# Patient Record
Sex: Female | Born: 1935 | Race: White | Hispanic: No | Smoking: Never smoker
Health system: Southern US, Community
[De-identification: ages and names within clinical notes are randomized; demographics above are authoritative.]

## PROBLEM LIST (undated history)

## (undated) DIAGNOSIS — M1990 Unspecified osteoarthritis, unspecified site: Secondary | ICD-10-CM

## (undated) DIAGNOSIS — I2510 Atherosclerotic heart disease of native coronary artery without angina pectoris: Secondary | ICD-10-CM

## (undated) DIAGNOSIS — I10 Essential (primary) hypertension: Secondary | ICD-10-CM

## (undated) DIAGNOSIS — F0390 Unspecified dementia without behavioral disturbance: Secondary | ICD-10-CM

## (undated) DIAGNOSIS — I5030 Unspecified diastolic (congestive) heart failure: Secondary | ICD-10-CM

## (undated) DIAGNOSIS — S32599A Other specified fracture of unspecified pubis, initial encounter for closed fracture: Secondary | ICD-10-CM

## (undated) DIAGNOSIS — J449 Chronic obstructive pulmonary disease, unspecified: Secondary | ICD-10-CM

## (undated) DIAGNOSIS — C439 Malignant melanoma of skin, unspecified: Secondary | ICD-10-CM

## (undated) DIAGNOSIS — K8020 Calculus of gallbladder without cholecystitis without obstruction: Secondary | ICD-10-CM

## (undated) DIAGNOSIS — M810 Age-related osteoporosis without current pathological fracture: Secondary | ICD-10-CM

## (undated) DIAGNOSIS — E785 Hyperlipidemia, unspecified: Secondary | ICD-10-CM

## (undated) HISTORY — DX: Essential (primary) hypertension: I10

## (undated) HISTORY — PX: TONSILLECTOMY AND ADENOIDECTOMY: SUR1326

## (undated) HISTORY — DX: Calculus of gallbladder without cholecystitis without obstruction: K80.20

## (undated) HISTORY — PX: APPENDECTOMY: SHX54

## (undated) HISTORY — PX: CATARACT EXTRACTION: SUR2

## (undated) HISTORY — DX: Hyperlipidemia, unspecified: E78.5

## (undated) HISTORY — PX: CHOLECYSTECTOMY: SHX55

## (undated) HISTORY — DX: Unspecified osteoarthritis, unspecified site: M19.90

## (undated) HISTORY — DX: Chronic obstructive pulmonary disease, unspecified: J44.9

## (undated) HISTORY — DX: Unspecified diastolic (congestive) heart failure: I50.30

## (undated) HISTORY — DX: Malignant melanoma of skin, unspecified: C43.9

## (undated) HISTORY — DX: Atherosclerotic heart disease of native coronary artery without angina pectoris: I25.10

## (undated) HISTORY — PX: MELANOMA EXCISION: SHX5266

## (undated) HISTORY — DX: Age-related osteoporosis without current pathological fracture: M81.0

## (undated) HISTORY — PX: BREAST LUMPECTOMY: SHX2

## (undated) HISTORY — PX: CARPAL TUNNEL RELEASE: SHX101

## (undated) HISTORY — PX: PAROTIDECTOMY: SUR1003

## (undated) HISTORY — PX: BLADDER SUSPENSION: SHX72

## (undated) HISTORY — PX: LYMPH NODE BIOPSY: SHX201

---

## 1999-07-21 ENCOUNTER — Other Ambulatory Visit: Admission: RE | Admit: 1999-07-21 | Discharge: 1999-07-21 | Payer: Self-pay | Admitting: Family Medicine

## 2000-07-20 ENCOUNTER — Other Ambulatory Visit: Admission: RE | Admit: 2000-07-20 | Discharge: 2000-07-20 | Payer: Self-pay | Admitting: Family Medicine

## 2000-10-28 ENCOUNTER — Ambulatory Visit (HOSPITAL_COMMUNITY): Admission: RE | Admit: 2000-10-28 | Discharge: 2000-10-28 | Payer: Self-pay | Admitting: Gastroenterology

## 2000-10-28 ENCOUNTER — Encounter: Payer: Self-pay | Admitting: Gastroenterology

## 2000-10-28 ENCOUNTER — Encounter (INDEPENDENT_AMBULATORY_CARE_PROVIDER_SITE_OTHER): Payer: Self-pay | Admitting: *Deleted

## 2001-07-25 ENCOUNTER — Other Ambulatory Visit: Admission: RE | Admit: 2001-07-25 | Discharge: 2001-07-25 | Payer: Self-pay | Admitting: Family Medicine

## 2002-08-29 ENCOUNTER — Other Ambulatory Visit: Admission: RE | Admit: 2002-08-29 | Discharge: 2002-08-29 | Payer: Self-pay | Admitting: Family Medicine

## 2003-09-17 ENCOUNTER — Other Ambulatory Visit: Admission: RE | Admit: 2003-09-17 | Discharge: 2003-09-17 | Payer: Self-pay | Admitting: Family Medicine

## 2004-08-06 ENCOUNTER — Ambulatory Visit (HOSPITAL_COMMUNITY): Admission: RE | Admit: 2004-08-06 | Discharge: 2004-08-06 | Payer: Self-pay | Admitting: Cardiology

## 2004-09-05 ENCOUNTER — Ambulatory Visit (HOSPITAL_COMMUNITY): Admission: RE | Admit: 2004-09-05 | Discharge: 2004-09-05 | Payer: Self-pay | Admitting: Urology

## 2004-11-17 ENCOUNTER — Other Ambulatory Visit: Admission: RE | Admit: 2004-11-17 | Discharge: 2004-11-17 | Payer: Self-pay | Admitting: Family Medicine

## 2005-09-22 ENCOUNTER — Other Ambulatory Visit: Admission: RE | Admit: 2005-09-22 | Discharge: 2005-09-22 | Payer: Self-pay | Admitting: Family Medicine

## 2005-12-21 ENCOUNTER — Encounter: Admission: RE | Admit: 2005-12-21 | Discharge: 2005-12-21 | Payer: Self-pay | Admitting: Orthopedic Surgery

## 2005-12-25 ENCOUNTER — Ambulatory Visit (HOSPITAL_BASED_OUTPATIENT_CLINIC_OR_DEPARTMENT_OTHER): Admission: RE | Admit: 2005-12-25 | Discharge: 2005-12-25 | Payer: Self-pay | Admitting: Orthopedic Surgery

## 2006-09-03 ENCOUNTER — Ambulatory Visit (HOSPITAL_BASED_OUTPATIENT_CLINIC_OR_DEPARTMENT_OTHER): Admission: RE | Admit: 2006-09-03 | Discharge: 2006-09-03 | Payer: Self-pay | Admitting: Orthopedic Surgery

## 2006-09-24 ENCOUNTER — Other Ambulatory Visit: Admission: RE | Admit: 2006-09-24 | Discharge: 2006-09-24 | Payer: Self-pay | Admitting: Family Medicine

## 2008-01-10 ENCOUNTER — Encounter: Admission: RE | Admit: 2008-01-10 | Discharge: 2008-04-05 | Payer: Self-pay | Admitting: Family Medicine

## 2008-07-09 ENCOUNTER — Encounter: Admission: RE | Admit: 2008-07-09 | Discharge: 2008-10-07 | Payer: Self-pay | Admitting: Family Medicine

## 2009-05-28 ENCOUNTER — Encounter (HOSPITAL_COMMUNITY): Admission: RE | Admit: 2009-05-28 | Discharge: 2009-08-05 | Payer: Self-pay | Admitting: Family Medicine

## 2009-06-28 ENCOUNTER — Encounter: Admission: RE | Admit: 2009-06-28 | Discharge: 2009-06-28 | Payer: Self-pay | Admitting: Family Medicine

## 2009-07-04 ENCOUNTER — Encounter (INDEPENDENT_AMBULATORY_CARE_PROVIDER_SITE_OTHER): Payer: Self-pay | Admitting: Surgery

## 2009-07-04 ENCOUNTER — Inpatient Hospital Stay (HOSPITAL_COMMUNITY): Admission: RE | Admit: 2009-07-04 | Discharge: 2009-07-10 | Payer: Self-pay | Admitting: Gastroenterology

## 2009-07-31 ENCOUNTER — Ambulatory Visit (HOSPITAL_COMMUNITY)
Admission: RE | Admit: 2009-07-31 | Discharge: 2009-07-31 | Payer: Self-pay | Source: Home / Self Care | Admitting: Surgery

## 2009-08-21 ENCOUNTER — Encounter: Admission: RE | Admit: 2009-08-21 | Discharge: 2009-09-25 | Payer: Self-pay | Admitting: Family Medicine

## 2010-06-24 ENCOUNTER — Encounter
Admission: RE | Admit: 2010-06-24 | Discharge: 2010-07-01 | Payer: Self-pay | Source: Home / Self Care | Attending: Family Medicine | Admitting: Family Medicine

## 2010-07-02 ENCOUNTER — Ambulatory Visit: Payer: BC Managed Care – HMO

## 2010-07-03 ENCOUNTER — Ambulatory Visit: Payer: Medicare Other | Attending: Family Medicine | Admitting: Physical Therapy

## 2010-07-03 DIAGNOSIS — M25619 Stiffness of unspecified shoulder, not elsewhere classified: Secondary | ICD-10-CM | POA: Insufficient documentation

## 2010-07-03 DIAGNOSIS — IMO0001 Reserved for inherently not codable concepts without codable children: Secondary | ICD-10-CM | POA: Insufficient documentation

## 2010-07-03 DIAGNOSIS — M25519 Pain in unspecified shoulder: Secondary | ICD-10-CM | POA: Insufficient documentation

## 2010-07-03 DIAGNOSIS — M542 Cervicalgia: Secondary | ICD-10-CM | POA: Insufficient documentation

## 2010-07-07 ENCOUNTER — Ambulatory Visit: Payer: Medicare Other

## 2010-07-10 ENCOUNTER — Ambulatory Visit: Payer: Medicare Other | Admitting: Physical Therapy

## 2010-07-14 ENCOUNTER — Ambulatory Visit: Payer: Medicare Other

## 2010-07-17 ENCOUNTER — Ambulatory Visit: Payer: Medicare Other | Admitting: Physical Therapy

## 2010-07-21 ENCOUNTER — Ambulatory Visit: Payer: Medicare Other

## 2010-07-24 ENCOUNTER — Ambulatory Visit: Payer: Medicare Other | Admitting: Physical Therapy

## 2010-08-20 LAB — CBC
HCT: 37.4 % (ref 36.0–46.0)
HCT: 37.8 % (ref 36.0–46.0)
HCT: 39.6 % (ref 36.0–46.0)
Hemoglobin: 12.9 g/dL (ref 12.0–15.0)
Hemoglobin: 13.1 g/dL (ref 12.0–15.0)
Hemoglobin: 13.6 g/dL (ref 12.0–15.0)
MCHC: 34.1 g/dL (ref 30.0–36.0)
MCHC: 34.4 g/dL (ref 30.0–36.0)
MCV: 95.1 fL (ref 78.0–100.0)
MCV: 96 fL (ref 78.0–100.0)
MCV: 96.2 fL (ref 78.0–100.0)
Platelets: 228 10*3/uL (ref 150–400)
Platelets: 240 10*3/uL (ref 150–400)
Platelets: 293 10*3/uL (ref 150–400)
Platelets: 317 10*3/uL (ref 150–400)
RBC: 4.08 MIL/uL (ref 3.87–5.11)
RBC: 4.13 MIL/uL (ref 3.87–5.11)
RDW: 14.7 % (ref 11.5–15.5)
RDW: 15 % (ref 11.5–15.5)
WBC: 10.9 10*3/uL — ABNORMAL HIGH (ref 4.0–10.5)
WBC: 12 10*3/uL — ABNORMAL HIGH (ref 4.0–10.5)

## 2010-08-20 LAB — COMPREHENSIVE METABOLIC PANEL
ALT: 31 U/L (ref 0–35)
ALT: 33 U/L (ref 0–35)
ALT: 53 U/L — ABNORMAL HIGH (ref 0–35)
ALT: 57 U/L — ABNORMAL HIGH (ref 0–35)
ALT: 79 U/L — ABNORMAL HIGH (ref 0–35)
AST: 68 U/L — ABNORMAL HIGH (ref 0–37)
Albumin: 2.3 g/dL — ABNORMAL LOW (ref 3.5–5.2)
Albumin: 2.4 g/dL — ABNORMAL LOW (ref 3.5–5.2)
Albumin: 2.4 g/dL — ABNORMAL LOW (ref 3.5–5.2)
Albumin: 2.5 g/dL — ABNORMAL LOW (ref 3.5–5.2)
Albumin: 2.5 g/dL — ABNORMAL LOW (ref 3.5–5.2)
Albumin: 2.8 g/dL — ABNORMAL LOW (ref 3.5–5.2)
Alkaline Phosphatase: 479 U/L — ABNORMAL HIGH (ref 39–117)
BUN: 10 mg/dL (ref 6–23)
BUN: 6 mg/dL (ref 6–23)
BUN: 9 mg/dL (ref 6–23)
CO2: 28 mEq/L (ref 19–32)
CO2: 31 mEq/L (ref 19–32)
Calcium: 8 mg/dL — ABNORMAL LOW (ref 8.4–10.5)
Calcium: 8.1 mg/dL — ABNORMAL LOW (ref 8.4–10.5)
Calcium: 8.4 mg/dL (ref 8.4–10.5)
Calcium: 8.5 mg/dL (ref 8.4–10.5)
Calcium: 8.6 mg/dL (ref 8.4–10.5)
Chloride: 100 mEq/L (ref 96–112)
Chloride: 103 mEq/L (ref 96–112)
Creatinine, Ser: 0.72 mg/dL (ref 0.4–1.2)
Creatinine, Ser: 0.86 mg/dL (ref 0.4–1.2)
GFR calc Af Amer: >60 mL/min (ref >60)
GFR calc Af Amer: >60 mL/min (ref >60)
GFR calc non Af Amer: >60 mL/min (ref >60)
GFR calc non Af Amer: >60 mL/min (ref >60)
GFR calc non Af Amer: >60 mL/min (ref >60)
Glucose, Bld: 113 mg/dL — ABNORMAL HIGH (ref 70–99)
Glucose, Bld: 137 mg/dL — ABNORMAL HIGH (ref 70–99)
Potassium: 3.3 mEq/L — ABNORMAL LOW (ref 3.5–5.1)
Potassium: 3.4 mEq/L — ABNORMAL LOW (ref 3.5–5.1)
Potassium: 3.7 mEq/L (ref 3.5–5.1)
Potassium: 4 mEq/L (ref 3.5–5.1)
Sodium: 137 mEq/L (ref 135–145)
Sodium: 141 mEq/L (ref 135–145)
Total Bilirubin: 2.1 mg/dL — ABNORMAL HIGH (ref 0.3–1.2)
Total Bilirubin: 4.2 mg/dL — ABNORMAL HIGH (ref 0.3–1.2)
Total Bilirubin: 4.3 mg/dL — ABNORMAL HIGH (ref 0.3–1.2)
Total Protein: 5.7 g/dL — ABNORMAL LOW (ref 6.0–8.3)
Total Protein: 6.1 g/dL (ref 6.0–8.3)

## 2010-08-20 LAB — ABO/RH: ABO/RH(D): O POS

## 2010-08-20 LAB — TYPE AND SCREEN: ABO/RH(D): O POS

## 2010-08-20 LAB — GLUCOSE, CAPILLARY

## 2010-10-17 NOTE — Op Note (Signed)
NAME:  Alice Hayes, Alice Hayes            ACCOUNT NO.:  [id number]   MEDICAL RECORD NO.:  [id number]          PATIENT TYPE:  AMB   LOCATION:  DSC                          FACILITY:  [facility]   PHYSICIAN:  Katy Fitch. Sypher, M.D. 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   POSTOP DIAGNOSIS:  Chronic entrapped neuropathy right median nerve at  carpal tunnel.   OPERATION:  Release of right transverse carpal ligament.   OPERATIONS:  Katy Fitch. Sypher, M.D.   ASSISTANT:  Marveen Reeks. Dasnoit, P.A.-C.   ANESTHESIA:  General by LMA.   SUPERVISING ANESTHESIOLOGIST:  Janetta Hora. Gelene Mink, M.D.   INDICATIONS:  Alice Hayes is an 77 year old woman referred through  the courtesy of Dr. Chales Salmon. Thacker for evaluation and management of  chronic right hand numbness.  Clinical examination revealed signs of  diffuse osteoarthritis at her thumb CMC joint and multiple  interphalangeal joints.  Electrodiagnostic studies completed at the  office confirmed significant right carpal tunnel syndrome.  Due to a  failure to respond to nonoperative measures, she is brought to the  operative room, at this time, for release of her right transverse carpal  ligament   PROCEDURE:  Alice Hayes is brought to the operating room and placed  in the supine position on the operating table.  Following the induction  of general anesthesia by LMA technique, the right arm was prepped with  Betadine soap and solution, and sterilely draped.  A pneumatic  tourniquet was applied to the proximal brachium.  Upon exsanguination of  the right arm with an Esmarch bandage; the arterial tourniquet was  inflated to 240 mmHg.   The procedure commenced with a short incision in the line of the ring  finger and the palm.  The subcutaneous tissues were  carefully divided in  the palmar fascia.  This was split longitudinally to reveal the common  sensory branch of the median nerve.  These were followed back to  transverse carpal ligament which was gently isolated from the median  nerve.  The ligament was then released with scissors along its ulnar  border.  This widely opened the carpal canal.  No masses or predicaments  were noted.  The nerve was quite hyperemic with significant bursal  fibrosis noted.  The wound was then inspected for bleeding points which  were repaired with intradermal 3-0 Prolene suture.   A compression dressing was applied with a volar plaster splint,  maintaining the wrist in 5 degrees of dorsiflexion.  For aftercare Ms.  Hayes is advised to use Vicodin 5 mg one p.o. q.4-6 h. p.r.n. pain  20 tablets without refill.      Katy Fitch Sypher, M.D.  Electronically Signed     RVS/MEDQ  D:  09/03/2006  T:  09/03/2006  Job:  [id number]

## 2010-10-17 NOTE — Cardiovascular Report (Signed)
NAMESHATERRA, SANZONE            ACCOUNT NO.:  [id number]   MEDICAL RECORD NO.:  [id number]          PATIENT TYPE:  OIB   LOCATION:  2899                         FACILITY:  [facility]   PHYSICIAN:  Armanda Magic, M.D.     DATE OF BIRTH:  1922-11-02   DATE OF PROCEDURE:  08/06/2004  DATE OF DISCHARGE:                              CARDIAC CATHETERIZATION   REFERRING PHYSICIAN:  Henrine Screws, M.D.   INDICATION FOR CATH:  Chest pain.   PROCEDURE:  1.  Left heart catheterization.  2.  Coronary angiography.  3.  Left ventriculography.   OPERATOR:  Armanda Magic, M.D.   INDICATIONS:  Chest pain and abnormal Cardiolite.   COMPLICATIONS:  None.   IV ACCESS:  Via right femoral artery 6 French sheath.   This is a 77 year old white female with a history of labile blood pressure  recently documented who had been having episodes of substernal chest pain  and recently several episodes which have been relieved by sublingual  nitroglycerin.  Stress Cardiolite study showed a questionable reversible  defect in the inferior septum at the base worrisome for underlying inducible  ischemia and, therefore, she now presents for cardiac catheterization.   The patient was brought to the cardiac catheterization laboratory in a  fasting nonsedated state.  Informed consent was obtained.  The patient was  connected to continuous heart rate, pulse oximetry monitoring and  intermittent blood pressure monitoring.  The right groin was prepped and  draped in a sterile fashion.  Xylocaine 1% was used for local anesthesia.  Using a modified Seldinger technique, an 8 French sheath was placed in the  right femoral artery.  Under fluoroscopic guidance, a 6 [place] JL4 catheter  was placed in the left coronary artery.  Multiple cine films were taken in  the 30 degree RAO, 40 degree LAO views.  This catheter was then exchanged  out of a guide wire for a 6 [place] JR4 catheter which was placed under  fluoroscopic  guidance in the right coronary artery.  Multiple cine films  were taken in the 30 degree RAO, 40 degree LAO views.  The catheter was then  exchanged out of her guide wire for a 6 French angled pigtail catheter which  was placed under fluoroscopic guidance in the left ventricular cavity.  Left  ventriculography was performed in the 30 degree RAO view using a total of 30  mL of contrast at 15 mL per second.  The catheter was then pulled back  across the aortic valve with no significant gradient noted.  At the end of  the procedure all catheters and sheaths were removed.  Manual compression  was performed until adequate hemostasis was obtained, and the patient was  transferred back to the room in stable condition.   RESULTS:  1.  Left main coronary artery is widely patent and bifurcates into the left      anterior descending artery and left circumflex artery.  The left      anterior descending artery is widely patent throughout its course.  In      the apex, except for luminal irregularities,  there is a focal 40-50%      narrowing in the mid LAD just after the takeoff the first diagonal.  The      first diagonal was widely patent.  The left circumflex is widely patent      throughout its course and there is noted to be luminal irregularities up      to 30% throughout the left circumflex.  It does give rise to two obtuse      marginal branches, both of which are widely patent.  The right coronary      artery has luminal irregularities, up to 30%, throughout its course, and      bifurcates distally in the posterior descending artery and posterior      lateral artery, both of which are widely patent.  Of note though, the      posterior descending artery and posterior lateral artery are small      vessels.   Left ventriculography shows normal LV systolic function, left ventricular  pressure 141/7 mmHg, aortic pressure 139/58 mmHg.   ASSESSMENT:  1.  Nonobstructive coronary disease.  2.   Normal left ventricular function.  3.  Chest pain, questionably secondary to coronary vasospasm verses      esophageal spasm.  There was no evidence of coronary vasospasm during      the cath and, therefore, I favor most likely esophageal spasm which also      is nitrate sensitive.  4.  Hypertension, which is stable.   PLAN:  1.  Discharge home after IV fluid and bedrest.  2.  Aggressive blood pressure control.  3.  Aspirin daily.  4.  Followup with me in 2 weeks for groin check.  5.  Followup with Dr. Abigail Miyamoto for further workup of probable GI cause of      chest pain.      TT/MEDQ  D:  08/06/2004  T:  08/06/2004  Job:  [id number]   cc:   Chales Salmon. Abigail Miyamoto, M.D.  [address]  [place]  [place] [address]  Fax: [phone]

## 2011-01-09 ENCOUNTER — Other Ambulatory Visit: Payer: Self-pay | Admitting: Gastroenterology

## 2011-03-31 ENCOUNTER — Ambulatory Visit: Payer: Medicare Other | Attending: Family Medicine | Admitting: Physical Therapy

## 2011-03-31 DIAGNOSIS — IMO0001 Reserved for inherently not codable concepts without codable children: Secondary | ICD-10-CM | POA: Insufficient documentation

## 2011-03-31 DIAGNOSIS — M2569 Stiffness of other specified joint, not elsewhere classified: Secondary | ICD-10-CM | POA: Insufficient documentation

## 2011-03-31 DIAGNOSIS — M542 Cervicalgia: Secondary | ICD-10-CM | POA: Insufficient documentation

## 2011-04-02 ENCOUNTER — Ambulatory Visit: Payer: Medicare Other | Attending: Family Medicine | Admitting: Physical Therapy

## 2011-04-02 DIAGNOSIS — M2569 Stiffness of other specified joint, not elsewhere classified: Secondary | ICD-10-CM | POA: Insufficient documentation

## 2011-04-02 DIAGNOSIS — IMO0001 Reserved for inherently not codable concepts without codable children: Secondary | ICD-10-CM | POA: Insufficient documentation

## 2011-04-02 DIAGNOSIS — M542 Cervicalgia: Secondary | ICD-10-CM | POA: Insufficient documentation

## 2011-04-14 ENCOUNTER — Encounter: Payer: Medicare Other | Admitting: Physical Therapy

## 2011-04-16 ENCOUNTER — Ambulatory Visit: Payer: Medicare Other | Admitting: Physical Therapy

## 2011-04-27 ENCOUNTER — Ambulatory Visit: Payer: Medicare Other

## 2011-04-30 ENCOUNTER — Ambulatory Visit: Payer: Medicare Other | Admitting: Physical Therapy

## 2011-05-05 ENCOUNTER — Encounter: Payer: Medicare Other | Admitting: Physical Therapy

## 2011-05-07 ENCOUNTER — Encounter: Payer: Medicare Other | Admitting: Physical Therapy

## 2011-05-12 ENCOUNTER — Encounter: Payer: Medicare Other | Admitting: Physical Therapy

## 2011-05-14 ENCOUNTER — Encounter: Payer: Medicare Other | Admitting: Physical Therapy

## 2011-06-23 DIAGNOSIS — M25569 Pain in unspecified knee: Secondary | ICD-10-CM | POA: Diagnosis not present

## 2011-06-23 DIAGNOSIS — J019 Acute sinusitis, unspecified: Secondary | ICD-10-CM | POA: Diagnosis not present

## 2011-06-23 DIAGNOSIS — M542 Cervicalgia: Secondary | ICD-10-CM | POA: Diagnosis not present

## 2011-07-06 DIAGNOSIS — M7150 Other bursitis, not elsewhere classified, unspecified site: Secondary | ICD-10-CM | POA: Diagnosis not present

## 2011-07-06 DIAGNOSIS — L97509 Non-pressure chronic ulcer of other part of unspecified foot with unspecified severity: Secondary | ICD-10-CM | POA: Diagnosis not present

## 2011-07-07 DIAGNOSIS — Z85828 Personal history of other malignant neoplasm of skin: Secondary | ICD-10-CM | POA: Diagnosis not present

## 2011-07-07 DIAGNOSIS — L570 Actinic keratosis: Secondary | ICD-10-CM | POA: Diagnosis not present

## 2011-07-28 DIAGNOSIS — H35059 Retinal neovascularization, unspecified, unspecified eye: Secondary | ICD-10-CM | POA: Diagnosis not present

## 2011-07-28 DIAGNOSIS — H43819 Vitreous degeneration, unspecified eye: Secondary | ICD-10-CM | POA: Diagnosis not present

## 2011-07-28 DIAGNOSIS — H353290 Exudative age-related macular degeneration, unspecified eye, stage unspecified: Secondary | ICD-10-CM | POA: Diagnosis not present

## 2011-07-28 DIAGNOSIS — H353190 Nonexudative age-related macular degeneration, unspecified eye, stage unspecified: Secondary | ICD-10-CM | POA: Diagnosis not present

## 2011-09-28 DIAGNOSIS — H903 Sensorineural hearing loss, bilateral: Secondary | ICD-10-CM | POA: Diagnosis not present

## 2011-10-08 ENCOUNTER — Other Ambulatory Visit: Payer: Self-pay | Admitting: Family Medicine

## 2011-10-08 DIAGNOSIS — M21619 Bunion of unspecified foot: Secondary | ICD-10-CM | POA: Diagnosis not present

## 2011-10-08 DIAGNOSIS — M2030 Hallux varus (acquired), unspecified foot: Secondary | ICD-10-CM | POA: Diagnosis not present

## 2011-10-08 DIAGNOSIS — R109 Unspecified abdominal pain: Secondary | ICD-10-CM | POA: Diagnosis not present

## 2011-10-08 DIAGNOSIS — R51 Headache: Secondary | ICD-10-CM | POA: Diagnosis not present

## 2011-10-12 DIAGNOSIS — Z79899 Other long term (current) drug therapy: Secondary | ICD-10-CM | POA: Diagnosis not present

## 2011-10-12 DIAGNOSIS — E039 Hypothyroidism, unspecified: Secondary | ICD-10-CM | POA: Diagnosis not present

## 2011-10-12 DIAGNOSIS — E782 Mixed hyperlipidemia: Secondary | ICD-10-CM | POA: Diagnosis not present

## 2011-10-13 ENCOUNTER — Other Ambulatory Visit: Payer: Self-pay | Admitting: Gastroenterology

## 2011-10-13 DIAGNOSIS — K5730 Diverticulosis of large intestine without perforation or abscess without bleeding: Secondary | ICD-10-CM

## 2011-10-15 ENCOUNTER — Ambulatory Visit
Admission: RE | Admit: 2011-10-15 | Discharge: 2011-10-15 | Disposition: A | Discharge status: Home / Self Care | Payer: Medicare Other | Source: Ambulatory Visit | Attending: Family Medicine | Admitting: Family Medicine

## 2011-10-15 DIAGNOSIS — Z9089 Acquired absence of other organs: Secondary | ICD-10-CM | POA: Diagnosis not present

## 2011-10-15 DIAGNOSIS — Z1231 Encounter for screening mammogram for malignant neoplasm of breast: Secondary | ICD-10-CM | POA: Diagnosis not present

## 2011-10-15 DIAGNOSIS — R109 Unspecified abdominal pain: Secondary | ICD-10-CM | POA: Diagnosis not present

## 2011-10-15 DIAGNOSIS — K7689 Other specified diseases of liver: Secondary | ICD-10-CM | POA: Diagnosis not present

## 2011-10-21 ENCOUNTER — Ambulatory Visit
Admission: RE | Admit: 2011-10-21 | Discharge: 2011-10-21 | Disposition: A | Discharge status: Home / Self Care | Payer: Medicare Other | Source: Ambulatory Visit | Attending: Gastroenterology | Admitting: Gastroenterology

## 2011-10-21 DIAGNOSIS — K5730 Diverticulosis of large intestine without perforation or abscess without bleeding: Secondary | ICD-10-CM | POA: Diagnosis not present

## 2011-10-22 DIAGNOSIS — N6489 Other specified disorders of breast: Secondary | ICD-10-CM | POA: Diagnosis not present

## 2011-10-28 DIAGNOSIS — H353290 Exudative age-related macular degeneration, unspecified eye, stage unspecified: Secondary | ICD-10-CM | POA: Diagnosis not present

## 2011-10-28 DIAGNOSIS — H43819 Vitreous degeneration, unspecified eye: Secondary | ICD-10-CM | POA: Diagnosis not present

## 2011-10-28 DIAGNOSIS — H353190 Nonexudative age-related macular degeneration, unspecified eye, stage unspecified: Secondary | ICD-10-CM | POA: Diagnosis not present

## 2011-10-28 DIAGNOSIS — H35059 Retinal neovascularization, unspecified, unspecified eye: Secondary | ICD-10-CM | POA: Diagnosis not present

## 2011-11-04 DIAGNOSIS — H353290 Exudative age-related macular degeneration, unspecified eye, stage unspecified: Secondary | ICD-10-CM | POA: Diagnosis not present

## 2012-01-06 DIAGNOSIS — G609 Hereditary and idiopathic neuropathy, unspecified: Secondary | ICD-10-CM | POA: Diagnosis not present

## 2012-01-06 DIAGNOSIS — IMO0002 Reserved for concepts with insufficient information to code with codable children: Secondary | ICD-10-CM | POA: Diagnosis not present

## 2012-02-23 ENCOUNTER — Other Ambulatory Visit: Payer: Self-pay | Admitting: Dermatology

## 2012-02-23 DIAGNOSIS — D239 Other benign neoplasm of skin, unspecified: Secondary | ICD-10-CM | POA: Diagnosis not present

## 2012-02-23 DIAGNOSIS — D485 Neoplasm of uncertain behavior of skin: Secondary | ICD-10-CM | POA: Diagnosis not present

## 2012-02-23 DIAGNOSIS — L570 Actinic keratosis: Secondary | ICD-10-CM | POA: Diagnosis not present

## 2012-02-23 DIAGNOSIS — Z85828 Personal history of other malignant neoplasm of skin: Secondary | ICD-10-CM | POA: Diagnosis not present

## 2012-03-08 DIAGNOSIS — H353290 Exudative age-related macular degeneration, unspecified eye, stage unspecified: Secondary | ICD-10-CM | POA: Diagnosis not present

## 2012-03-08 DIAGNOSIS — H35059 Retinal neovascularization, unspecified, unspecified eye: Secondary | ICD-10-CM | POA: Diagnosis not present

## 2012-04-02 DIAGNOSIS — Z23 Encounter for immunization: Secondary | ICD-10-CM | POA: Diagnosis not present

## 2012-04-22 DIAGNOSIS — N3941 Urge incontinence: Secondary | ICD-10-CM | POA: Diagnosis not present

## 2012-04-22 DIAGNOSIS — G609 Hereditary and idiopathic neuropathy, unspecified: Secondary | ICD-10-CM | POA: Diagnosis not present

## 2012-04-22 DIAGNOSIS — I10 Essential (primary) hypertension: Secondary | ICD-10-CM | POA: Diagnosis not present

## 2012-06-02 DIAGNOSIS — L84 Corns and callosities: Secondary | ICD-10-CM | POA: Diagnosis not present

## 2012-06-02 DIAGNOSIS — M2040 Other hammer toe(s) (acquired), unspecified foot: Secondary | ICD-10-CM | POA: Diagnosis not present

## 2012-06-02 DIAGNOSIS — I739 Peripheral vascular disease, unspecified: Secondary | ICD-10-CM | POA: Diagnosis not present

## 2012-06-03 DIAGNOSIS — R269 Unspecified abnormalities of gait and mobility: Secondary | ICD-10-CM | POA: Diagnosis not present

## 2012-06-07 DIAGNOSIS — H43819 Vitreous degeneration, unspecified eye: Secondary | ICD-10-CM | POA: Diagnosis not present

## 2012-06-07 DIAGNOSIS — H35059 Retinal neovascularization, unspecified, unspecified eye: Secondary | ICD-10-CM | POA: Diagnosis not present

## 2012-06-07 DIAGNOSIS — H353290 Exudative age-related macular degeneration, unspecified eye, stage unspecified: Secondary | ICD-10-CM | POA: Diagnosis not present

## 2012-06-16 DIAGNOSIS — R350 Frequency of micturition: Secondary | ICD-10-CM | POA: Diagnosis not present

## 2012-06-16 DIAGNOSIS — M79609 Pain in unspecified limb: Secondary | ICD-10-CM | POA: Diagnosis not present

## 2012-07-11 DIAGNOSIS — H353190 Nonexudative age-related macular degeneration, unspecified eye, stage unspecified: Secondary | ICD-10-CM | POA: Diagnosis not present

## 2012-07-11 DIAGNOSIS — Z961 Presence of intraocular lens: Secondary | ICD-10-CM | POA: Diagnosis not present

## 2012-07-22 DIAGNOSIS — M810 Age-related osteoporosis without current pathological fracture: Secondary | ICD-10-CM | POA: Diagnosis not present

## 2012-07-22 DIAGNOSIS — E782 Mixed hyperlipidemia: Secondary | ICD-10-CM | POA: Diagnosis not present

## 2012-07-22 DIAGNOSIS — Z79899 Other long term (current) drug therapy: Secondary | ICD-10-CM | POA: Diagnosis not present

## 2012-07-22 DIAGNOSIS — G609 Hereditary and idiopathic neuropathy, unspecified: Secondary | ICD-10-CM | POA: Diagnosis not present

## 2012-07-22 DIAGNOSIS — E039 Hypothyroidism, unspecified: Secondary | ICD-10-CM | POA: Diagnosis not present

## 2012-07-22 DIAGNOSIS — N3941 Urge incontinence: Secondary | ICD-10-CM | POA: Diagnosis not present

## 2012-08-30 DIAGNOSIS — H35059 Retinal neovascularization, unspecified, unspecified eye: Secondary | ICD-10-CM | POA: Diagnosis not present

## 2012-08-30 DIAGNOSIS — H353290 Exudative age-related macular degeneration, unspecified eye, stage unspecified: Secondary | ICD-10-CM | POA: Diagnosis not present

## 2012-08-30 DIAGNOSIS — H43819 Vitreous degeneration, unspecified eye: Secondary | ICD-10-CM | POA: Diagnosis not present

## 2012-09-22 DIAGNOSIS — I10 Essential (primary) hypertension: Secondary | ICD-10-CM | POA: Diagnosis not present

## 2012-09-22 DIAGNOSIS — S41109A Unspecified open wound of unspecified upper arm, initial encounter: Secondary | ICD-10-CM | POA: Diagnosis not present

## 2012-09-22 DIAGNOSIS — N3941 Urge incontinence: Secondary | ICD-10-CM | POA: Diagnosis not present

## 2012-09-22 DIAGNOSIS — G609 Hereditary and idiopathic neuropathy, unspecified: Secondary | ICD-10-CM | POA: Diagnosis not present

## 2012-10-14 DIAGNOSIS — S91009A Unspecified open wound, unspecified ankle, initial encounter: Secondary | ICD-10-CM | POA: Diagnosis not present

## 2012-10-14 DIAGNOSIS — S81009A Unspecified open wound, unspecified knee, initial encounter: Secondary | ICD-10-CM | POA: Diagnosis not present

## 2012-10-17 DIAGNOSIS — T148XXA Other injury of unspecified body region, initial encounter: Secondary | ICD-10-CM | POA: Diagnosis not present

## 2012-10-17 DIAGNOSIS — L02419 Cutaneous abscess of limb, unspecified: Secondary | ICD-10-CM | POA: Diagnosis not present

## 2012-10-17 DIAGNOSIS — R42 Dizziness and giddiness: Secondary | ICD-10-CM | POA: Diagnosis not present

## 2012-10-25 DIAGNOSIS — T148XXA Other injury of unspecified body region, initial encounter: Secondary | ICD-10-CM | POA: Diagnosis not present

## 2012-11-01 ENCOUNTER — Ambulatory Visit: Payer: Medicare Other | Attending: Family Medicine | Admitting: Rehabilitative and Restorative Service Providers"

## 2012-11-01 DIAGNOSIS — R42 Dizziness and giddiness: Secondary | ICD-10-CM | POA: Diagnosis not present

## 2012-11-01 DIAGNOSIS — R269 Unspecified abnormalities of gait and mobility: Secondary | ICD-10-CM | POA: Insufficient documentation

## 2012-11-01 DIAGNOSIS — IMO0001 Reserved for inherently not codable concepts without codable children: Secondary | ICD-10-CM | POA: Insufficient documentation

## 2012-11-09 ENCOUNTER — Ambulatory Visit: Payer: Medicare Other | Admitting: Rehabilitative and Restorative Service Providers"

## 2012-11-11 ENCOUNTER — Ambulatory Visit: Payer: Medicare Other | Admitting: Rehabilitative and Restorative Service Providers"

## 2012-11-14 ENCOUNTER — Ambulatory Visit: Payer: Medicare Other | Admitting: Rehabilitative and Restorative Service Providers"

## 2012-11-16 ENCOUNTER — Ambulatory Visit: Payer: Medicare Other | Admitting: Rehabilitative and Restorative Service Providers"

## 2012-11-21 ENCOUNTER — Ambulatory Visit: Payer: Medicare Other | Admitting: Rehabilitative and Restorative Service Providers"

## 2012-11-24 ENCOUNTER — Ambulatory Visit: Payer: Medicare Other | Admitting: Rehabilitative and Restorative Service Providers"

## 2012-11-28 ENCOUNTER — Ambulatory Visit: Payer: Medicare Other | Admitting: Rehabilitative and Restorative Service Providers"

## 2012-11-29 ENCOUNTER — Ambulatory Visit: Payer: Medicare Other | Admitting: Rehabilitative and Restorative Service Providers"

## 2012-12-14 ENCOUNTER — Ambulatory Visit: Payer: Medicare Other | Attending: Family Medicine | Admitting: Rehabilitative and Restorative Service Providers"

## 2012-12-14 DIAGNOSIS — R269 Unspecified abnormalities of gait and mobility: Secondary | ICD-10-CM | POA: Insufficient documentation

## 2012-12-14 DIAGNOSIS — R42 Dizziness and giddiness: Secondary | ICD-10-CM | POA: Diagnosis not present

## 2012-12-14 DIAGNOSIS — IMO0001 Reserved for inherently not codable concepts without codable children: Secondary | ICD-10-CM | POA: Diagnosis not present

## 2013-01-04 DIAGNOSIS — H353290 Exudative age-related macular degeneration, unspecified eye, stage unspecified: Secondary | ICD-10-CM | POA: Diagnosis not present

## 2013-01-04 DIAGNOSIS — H35059 Retinal neovascularization, unspecified, unspecified eye: Secondary | ICD-10-CM | POA: Diagnosis not present

## 2013-01-11 DIAGNOSIS — L570 Actinic keratosis: Secondary | ICD-10-CM | POA: Diagnosis not present

## 2013-01-11 DIAGNOSIS — I8310 Varicose veins of unspecified lower extremity with inflammation: Secondary | ICD-10-CM | POA: Diagnosis not present

## 2013-01-11 DIAGNOSIS — D239 Other benign neoplasm of skin, unspecified: Secondary | ICD-10-CM | POA: Diagnosis not present

## 2013-01-11 DIAGNOSIS — L821 Other seborrheic keratosis: Secondary | ICD-10-CM | POA: Diagnosis not present

## 2013-02-02 DIAGNOSIS — N390 Urinary tract infection, site not specified: Secondary | ICD-10-CM | POA: Diagnosis not present

## 2013-02-02 DIAGNOSIS — R42 Dizziness and giddiness: Secondary | ICD-10-CM | POA: Diagnosis not present

## 2013-02-02 DIAGNOSIS — J329 Chronic sinusitis, unspecified: Secondary | ICD-10-CM | POA: Diagnosis not present

## 2013-02-02 DIAGNOSIS — R3915 Urgency of urination: Secondary | ICD-10-CM | POA: Diagnosis not present

## 2013-02-03 DIAGNOSIS — R3915 Urgency of urination: Secondary | ICD-10-CM | POA: Diagnosis not present

## 2013-03-18 DIAGNOSIS — Z23 Encounter for immunization: Secondary | ICD-10-CM | POA: Diagnosis not present

## 2013-04-04 DIAGNOSIS — M1990 Unspecified osteoarthritis, unspecified site: Secondary | ICD-10-CM | POA: Diagnosis not present

## 2013-04-19 ENCOUNTER — Ambulatory Visit: Payer: Medicare Other | Attending: Family Medicine | Admitting: Physical Therapy

## 2013-04-19 DIAGNOSIS — M542 Cervicalgia: Secondary | ICD-10-CM | POA: Diagnosis not present

## 2013-04-19 DIAGNOSIS — IMO0001 Reserved for inherently not codable concepts without codable children: Secondary | ICD-10-CM | POA: Insufficient documentation

## 2013-04-19 DIAGNOSIS — M25519 Pain in unspecified shoulder: Secondary | ICD-10-CM | POA: Insufficient documentation

## 2013-04-24 ENCOUNTER — Ambulatory Visit: Payer: Medicare Other | Admitting: Physical Therapy

## 2013-04-26 ENCOUNTER — Ambulatory Visit: Payer: Medicare Other | Admitting: Physical Therapy

## 2013-05-01 ENCOUNTER — Ambulatory Visit: Payer: Medicare Other | Attending: Family Medicine | Admitting: Physical Therapy

## 2013-05-01 DIAGNOSIS — IMO0001 Reserved for inherently not codable concepts without codable children: Secondary | ICD-10-CM | POA: Diagnosis not present

## 2013-05-01 DIAGNOSIS — M542 Cervicalgia: Secondary | ICD-10-CM | POA: Insufficient documentation

## 2013-05-01 DIAGNOSIS — M25519 Pain in unspecified shoulder: Secondary | ICD-10-CM | POA: Diagnosis not present

## 2013-05-04 ENCOUNTER — Ambulatory Visit: Payer: Medicare Other | Admitting: Physical Therapy

## 2013-05-09 ENCOUNTER — Ambulatory Visit: Payer: Medicare Other | Admitting: Physical Therapy

## 2013-05-11 ENCOUNTER — Ambulatory Visit: Payer: Medicare Other | Admitting: Physical Therapy

## 2013-05-15 ENCOUNTER — Ambulatory Visit: Payer: Medicare Other | Admitting: Physical Therapy

## 2013-05-18 ENCOUNTER — Ambulatory Visit: Payer: Medicare Other | Admitting: Physical Therapy

## 2013-05-22 ENCOUNTER — Ambulatory Visit: Payer: Medicare Other | Admitting: Physical Therapy

## 2013-05-22 DIAGNOSIS — J40 Bronchitis, not specified as acute or chronic: Secondary | ICD-10-CM | POA: Diagnosis not present

## 2013-05-22 DIAGNOSIS — S93499A Sprain of other ligament of unspecified ankle, initial encounter: Secondary | ICD-10-CM | POA: Diagnosis not present

## 2013-05-29 ENCOUNTER — Ambulatory Visit: Payer: Medicare Other | Admitting: Physical Therapy

## 2013-05-31 DIAGNOSIS — R509 Fever, unspecified: Secondary | ICD-10-CM | POA: Diagnosis not present

## 2013-06-02 ENCOUNTER — Ambulatory Visit: Payer: Medicare Other | Admitting: Physical Therapy

## 2013-06-05 ENCOUNTER — Encounter: Payer: Medicare Other | Admitting: Physical Therapy

## 2013-06-05 ENCOUNTER — Ambulatory Visit: Payer: Medicare Other | Attending: Orthopedic Surgery | Admitting: Physical Therapy

## 2013-06-05 DIAGNOSIS — IMO0001 Reserved for inherently not codable concepts without codable children: Secondary | ICD-10-CM | POA: Diagnosis not present

## 2013-06-05 DIAGNOSIS — M542 Cervicalgia: Secondary | ICD-10-CM | POA: Insufficient documentation

## 2013-06-05 DIAGNOSIS — M25519 Pain in unspecified shoulder: Secondary | ICD-10-CM | POA: Diagnosis not present

## 2013-06-06 DIAGNOSIS — S93499A Sprain of other ligament of unspecified ankle, initial encounter: Secondary | ICD-10-CM | POA: Diagnosis not present

## 2013-06-06 DIAGNOSIS — S96819A Strain of other specified muscles and tendons at ankle and foot level, unspecified foot, initial encounter: Secondary | ICD-10-CM | POA: Diagnosis not present

## 2013-06-08 ENCOUNTER — Ambulatory Visit: Payer: Medicare Other | Admitting: Physical Therapy

## 2013-06-13 ENCOUNTER — Ambulatory Visit: Payer: Medicare Other | Admitting: Physical Therapy

## 2013-06-14 DIAGNOSIS — M810 Age-related osteoporosis without current pathological fracture: Secondary | ICD-10-CM | POA: Diagnosis not present

## 2013-06-15 ENCOUNTER — Ambulatory Visit: Payer: Medicare Other | Admitting: Physical Therapy

## 2013-06-20 ENCOUNTER — Ambulatory Visit: Payer: Medicare Other | Admitting: Physical Therapy

## 2013-06-21 DIAGNOSIS — J069 Acute upper respiratory infection, unspecified: Secondary | ICD-10-CM | POA: Diagnosis not present

## 2013-06-21 DIAGNOSIS — I517 Cardiomegaly: Secondary | ICD-10-CM | POA: Diagnosis not present

## 2013-06-22 ENCOUNTER — Ambulatory Visit: Payer: Medicare Other | Admitting: Physical Therapy

## 2013-06-27 ENCOUNTER — Ambulatory Visit: Payer: Medicare Other | Admitting: Physical Therapy

## 2013-06-29 DIAGNOSIS — M7660 Achilles tendinitis, unspecified leg: Secondary | ICD-10-CM | POA: Diagnosis not present

## 2013-07-05 DIAGNOSIS — H903 Sensorineural hearing loss, bilateral: Secondary | ICD-10-CM | POA: Diagnosis not present

## 2013-07-10 DIAGNOSIS — R071 Chest pain on breathing: Secondary | ICD-10-CM | POA: Diagnosis not present

## 2013-07-10 DIAGNOSIS — M810 Age-related osteoporosis without current pathological fracture: Secondary | ICD-10-CM | POA: Diagnosis not present

## 2013-08-22 DIAGNOSIS — I10 Essential (primary) hypertension: Secondary | ICD-10-CM | POA: Diagnosis not present

## 2013-08-22 DIAGNOSIS — Z0000 Encounter for general adult medical examination without abnormal findings: Secondary | ICD-10-CM | POA: Diagnosis not present

## 2013-08-22 DIAGNOSIS — R29818 Other symptoms and signs involving the nervous system: Secondary | ICD-10-CM | POA: Diagnosis not present

## 2013-08-22 DIAGNOSIS — Z23 Encounter for immunization: Secondary | ICD-10-CM | POA: Diagnosis not present

## 2013-08-22 DIAGNOSIS — E039 Hypothyroidism, unspecified: Secondary | ICD-10-CM | POA: Diagnosis not present

## 2013-08-25 DIAGNOSIS — E039 Hypothyroidism, unspecified: Secondary | ICD-10-CM | POA: Diagnosis not present

## 2013-08-25 DIAGNOSIS — E785 Hyperlipidemia, unspecified: Secondary | ICD-10-CM | POA: Diagnosis not present

## 2013-08-30 ENCOUNTER — Ambulatory Visit: Payer: Medicare Other | Attending: Orthopedic Surgery | Admitting: Physical Therapy

## 2013-08-30 DIAGNOSIS — IMO0001 Reserved for inherently not codable concepts without codable children: Secondary | ICD-10-CM | POA: Diagnosis not present

## 2013-08-30 DIAGNOSIS — M542 Cervicalgia: Secondary | ICD-10-CM | POA: Insufficient documentation

## 2013-08-30 DIAGNOSIS — M25519 Pain in unspecified shoulder: Secondary | ICD-10-CM | POA: Insufficient documentation

## 2013-09-04 DIAGNOSIS — R29818 Other symptoms and signs involving the nervous system: Secondary | ICD-10-CM | POA: Diagnosis not present

## 2013-09-05 ENCOUNTER — Ambulatory Visit: Payer: Medicare Other | Admitting: Physical Therapy

## 2013-09-07 ENCOUNTER — Ambulatory Visit: Payer: Medicare Other | Admitting: Physical Therapy

## 2013-09-12 ENCOUNTER — Ambulatory Visit: Payer: Medicare Other | Admitting: Physical Therapy

## 2013-09-14 ENCOUNTER — Ambulatory Visit: Payer: Medicare Other | Admitting: Physical Therapy

## 2013-09-19 ENCOUNTER — Ambulatory Visit: Payer: Medicare Other | Admitting: Physical Therapy

## 2013-09-21 ENCOUNTER — Ambulatory Visit: Payer: Medicare Other | Admitting: Physical Therapy

## 2013-09-26 ENCOUNTER — Ambulatory Visit: Payer: Medicare Other | Admitting: Physical Therapy

## 2013-09-26 DIAGNOSIS — L570 Actinic keratosis: Secondary | ICD-10-CM | POA: Diagnosis not present

## 2013-09-26 DIAGNOSIS — I8310 Varicose veins of unspecified lower extremity with inflammation: Secondary | ICD-10-CM | POA: Diagnosis not present

## 2013-09-28 ENCOUNTER — Ambulatory Visit: Payer: Medicare Other | Admitting: Physical Therapy

## 2013-10-10 ENCOUNTER — Ambulatory Visit: Payer: Medicare Other | Admitting: Physical Therapy

## 2013-10-10 DIAGNOSIS — H43819 Vitreous degeneration, unspecified eye: Secondary | ICD-10-CM | POA: Diagnosis not present

## 2013-10-10 DIAGNOSIS — H353290 Exudative age-related macular degeneration, unspecified eye, stage unspecified: Secondary | ICD-10-CM | POA: Diagnosis not present

## 2013-10-10 DIAGNOSIS — Z961 Presence of intraocular lens: Secondary | ICD-10-CM | POA: Diagnosis not present

## 2013-10-12 ENCOUNTER — Ambulatory Visit: Payer: Medicare Other | Admitting: Physical Therapy

## 2013-10-12 DIAGNOSIS — R42 Dizziness and giddiness: Secondary | ICD-10-CM | POA: Diagnosis not present

## 2013-10-17 ENCOUNTER — Ambulatory Visit: Payer: Medicare Other | Attending: Orthopedic Surgery | Admitting: Physical Therapy

## 2013-10-17 DIAGNOSIS — IMO0001 Reserved for inherently not codable concepts without codable children: Secondary | ICD-10-CM | POA: Diagnosis not present

## 2013-10-17 DIAGNOSIS — M25519 Pain in unspecified shoulder: Secondary | ICD-10-CM | POA: Insufficient documentation

## 2013-10-17 DIAGNOSIS — M542 Cervicalgia: Secondary | ICD-10-CM | POA: Diagnosis not present

## 2013-10-19 ENCOUNTER — Ambulatory Visit: Payer: Medicare Other | Admitting: Physical Therapy

## 2013-10-24 ENCOUNTER — Ambulatory Visit: Payer: Medicare Other | Admitting: Physical Therapy

## 2013-10-24 DIAGNOSIS — H43819 Vitreous degeneration, unspecified eye: Secondary | ICD-10-CM | POA: Diagnosis not present

## 2013-10-24 DIAGNOSIS — H35059 Retinal neovascularization, unspecified, unspecified eye: Secondary | ICD-10-CM | POA: Diagnosis not present

## 2013-10-24 DIAGNOSIS — H353290 Exudative age-related macular degeneration, unspecified eye, stage unspecified: Secondary | ICD-10-CM | POA: Diagnosis not present

## 2013-11-06 DIAGNOSIS — H353290 Exudative age-related macular degeneration, unspecified eye, stage unspecified: Secondary | ICD-10-CM | POA: Diagnosis not present

## 2013-11-06 DIAGNOSIS — H35059 Retinal neovascularization, unspecified, unspecified eye: Secondary | ICD-10-CM | POA: Diagnosis not present

## 2013-11-23 ENCOUNTER — Encounter: Payer: Self-pay | Admitting: Cardiology

## 2013-11-23 ENCOUNTER — Ambulatory Visit (INDEPENDENT_AMBULATORY_CARE_PROVIDER_SITE_OTHER): Payer: Medicare Other | Admitting: Cardiology

## 2013-11-23 ENCOUNTER — Encounter: Payer: Self-pay | Admitting: *Deleted

## 2013-11-23 VITALS — BP 130/60 | HR 59 | Ht 59.0 in | Wt 121.0 lb

## 2013-11-23 DIAGNOSIS — R0989 Other specified symptoms and signs involving the circulatory and respiratory systems: Secondary | ICD-10-CM

## 2013-11-23 DIAGNOSIS — R0609 Other forms of dyspnea: Secondary | ICD-10-CM

## 2013-11-23 DIAGNOSIS — R42 Dizziness and giddiness: Secondary | ICD-10-CM

## 2013-11-23 DIAGNOSIS — R0600 Dyspnea, unspecified: Secondary | ICD-10-CM

## 2013-11-23 NOTE — Progress Notes (Addendum)
HPI The patient presents as a new patient for evaluation of shortness of breath. She has a history of nonobstructive coronary disease with cath in 2006.  She says she's been getting slowly progressively more breath over a few years. She says she gets short of breath with activities such as walking quickly. She does some water aerobics and can't bring on dyspnea with this. She does not describe chest pressure, neck or arm discomfort. He does not describe palpitations, presyncope or syncope. She has no PND or orthopnea. She's had no weight gain or edema. She is quite active for her age and otherwise as well.   Allergies  Allergen Reactions  . Voltaren Ophthalmic [Diclofenac]     Current Outpatient Prescriptions  Medication Sig Dispense Refill  . AMINO ACIDS COMPLEX PO Take by mouth.      Marland Kitchen amLODipine (NORVASC) 5 MG tablet Take 5 mg by mouth daily.      Marland Kitchen CALCIUM & MAGNESIUM CARBONATES PO Take 311 mg by mouth daily.      . Coenzyme Q10 (CO Q 10) 10 MG CAPS Take by mouth.      . gabapentin (NEURONTIN) 600 MG tablet Take 600 mg by mouth 2 (two) times daily.      . hydrochlorothiazide (HYDRODIURIL) 25 MG tablet Take 25 mg by mouth daily.      . meclizine (ANTIVERT) 25 MG tablet Take 25 mg by mouth 3 (three) times daily as needed for dizziness.      . Multiple Vitamin (MULTIVITAMIN WITH MINERALS) TABS tablet Take 1 tablet by mouth daily.      . Multiple Vitamins-Minerals (ICAPS) CAPS Take by mouth.      . omega-3 acid ethyl esters (LOVAZA) 1 G capsule Take by mouth 2 (two) times daily.      Marland Kitchen oxybutynin (DITROPAN-XL) 5 MG 24 hr tablet Take 5 mg by mouth at bedtime.      Marland Kitchen thyroid (ARMOUR THYROID) 60 MG tablet Take 60 mg by mouth daily before breakfast.       No current facility-administered medications for this visit.    Past Medical History  Diagnosis Date  . HTN (hypertension)   . Melanoma   . CAD (coronary artery disease)     nonobstructive cath 2006  . Cholelithiases   .  Osteoporosis   . COPD (chronic obstructive pulmonary disease)   . Hyperlipidemia   . Osteoarthritis   . Diastolic heart failure     Past Surgical History  Procedure Laterality Date  . Appendectomy    . Breast lumpectomy    . Tonsillectomy and adenoidectomy    . Parotidectomy      1959  . Bladder suspension      2006  . Carpal tunnel release      2007  . Melanoma excision      2008  . Lymph node biopsy    . Cataract extraction      Family History  Problem Relation Age of Onset  . CAD Father     Sudden death age 71  . CAD Son 71    MI.  Alive in his 27s  . Cancer Daughter     Breast  . Pancreatitis Daughter     Severe episode    History   Social History  . Marital Status: Widowed    Spouse Name: N/A    Number of Children: N/A  . Years of Education: N/A   Occupational History  . Not on file.  Social History Main Topics  . Smoking status: Never Smoker   . Smokeless tobacco: Not on file  . Alcohol Use: Not on file  . Drug Use: Not on file  . Sexual Activity: Not on file   Other Topics Concern  . Not on file   Social History Narrative  . No narrative on file    ROS:  Positive for vertigo, macular degeneration, bronchitis, occasional reflux, leg cramping and neuropathy.  Otherwise as stated in the HPI and negative for all other systems.  PHYSICAL EXAM BP 130/60  Pulse 59  Ht 4\' 11"  (1.499 m)  Wt 121 lb (54.885 kg)  BMI 24.43 kg/m2 GENERAL:  Well appearing, looks young than stated age 80:  Pupils equal round and reactive, fundi not visualized, oral mucosa unremarkable NECK:  No jugular venous distention, waveform within normal limits, carotid upstroke brisk and symmetric, no bruits, no thyromegaly LYMPHATICS:  No cervical, inguinal adenopathy LUNGS:  Clear to auscultation bilaterally BACK:  No CVA tenderness CHEST:  Unremarkable HEART:  PMI not displaced or sustained,S1 and S2 within normal limits, no S3, no S4, no clicks, no rubs, no  murmurs ABD:  Flat, positive bowel sounds normal in frequency in pitch, no bruits, no rebound, no guarding, no midline pulsatile mass, no hepatomegaly, no splenomegaly EXT:  2 plus pulses throughout, no edema, no cyanosis no clubbing SKIN:  No rashes no nodules NEURO:  Cranial nerves II through XII grossly intact, motor grossly intact throughout PSYCH:  Cognitively intact, oriented to person place and time  EKG:  Sinus rhythm, rate 59, axis within normal limits, intervals within normal limits, anterior R wave progression, no acute ST-T wave changes.  11/23/2013  ASSESSMENT AND PLAN  DYSPNEA:  I will start with a BNP level. I would also like to screen her for progression of coronary disease. She needs stress testing would not be a walk on a treadmill. Therefore, she will have a [employer].  If these studies are unremarkable no further cardiac workup would be plan.  DIASTOLIC HF:  She has a vague history of this. However, he seems to be euvolemic. I will start with a BNP level although will have a low threshold for echocardiography if this is elevated and stress testing is normal.   Note:  After the patient left I did get her [facility] records and a BNP was normal.

## 2013-11-23 NOTE — Patient Instructions (Signed)
Your physician recommends that you schedule a follow-up appointment in: [place] has requested that you have a lexiscan myoview. For further information please visit [email or web address]. Please follow instruction sheet, as given.

## 2013-11-27 DIAGNOSIS — R29818 Other symptoms and signs involving the nervous system: Secondary | ICD-10-CM | POA: Diagnosis not present

## 2013-11-27 DIAGNOSIS — R42 Dizziness and giddiness: Secondary | ICD-10-CM | POA: Diagnosis not present

## 2013-11-29 ENCOUNTER — Ambulatory Visit (HOSPITAL_COMMUNITY)
Admission: RE | Admit: 2013-11-29 | Discharge: 2013-11-29 | Disposition: A | Discharge status: Home / Self Care | Payer: Medicare Other | Source: Ambulatory Visit | Attending: Cardiovascular Disease | Admitting: Cardiovascular Disease

## 2013-11-29 DIAGNOSIS — J449 Chronic obstructive pulmonary disease, unspecified: Secondary | ICD-10-CM | POA: Insufficient documentation

## 2013-11-29 DIAGNOSIS — I5030 Unspecified diastolic (congestive) heart failure: Secondary | ICD-10-CM | POA: Diagnosis not present

## 2013-11-29 DIAGNOSIS — R0609 Other forms of dyspnea: Secondary | ICD-10-CM | POA: Insufficient documentation

## 2013-11-29 DIAGNOSIS — R0989 Other specified symptoms and signs involving the circulatory and respiratory systems: Secondary | ICD-10-CM | POA: Insufficient documentation

## 2013-11-29 DIAGNOSIS — I2510 Atherosclerotic heart disease of native coronary artery without angina pectoris: Secondary | ICD-10-CM | POA: Diagnosis not present

## 2013-11-29 DIAGNOSIS — J4489 Other specified chronic obstructive pulmonary disease: Secondary | ICD-10-CM | POA: Insufficient documentation

## 2013-11-29 MED ORDER — TECHNETIUM TC 99M SESTAMIBI GENERIC - CARDIOLITE
29.8000 | Freq: Once | INTRAVENOUS | Status: AC | PRN
  Administered 2013-11-29: 29.8 via INTRAVENOUS

## 2013-11-29 MED ORDER — REGADENOSON 0.4 MG/5ML IV SOLN
0.4000 mg | Freq: Once | INTRAVENOUS | Status: AC
  Administered 2013-11-29: 0.4 mg via INTRAVENOUS

## 2013-11-29 MED ORDER — TECHNETIUM TC 99M SESTAMIBI GENERIC - CARDIOLITE
10.2000 | Freq: Once | INTRAVENOUS | Status: AC | PRN
  Administered 2013-11-29: 10 via INTRAVENOUS

## 2013-11-29 MED ORDER — AMINOPHYLLINE 25 MG/ML IV SOLN
75.0000 mg | Freq: Once | INTRAVENOUS | Status: AC
  Administered 2013-11-29: 75 mg via INTRAVENOUS

## 2013-11-29 NOTE — Procedures (Addendum)
[facility] [address] [place] [address] [phone]  Cardiology Nuclear Med Study  RMONI KEPLINGER is a 80 y.o. female     MRN : [id number]     DOB: [birth year]  Procedure Date: 11/29/2013  Nuclear Med Background Indication for Stress Test:  Evaluation for Ischemia History:  COPD and CAD;diastolic heart failure;No prior NUC MPI for comparison. Cardiac Risk Factors: Family History - CAD, Hypertension and Lipids  Symptoms:  Dizziness, DOE, Fatigue, Light-Headedness and SOB   Nuclear Pre-Procedure Caffeine/Decaff Intake:  1:00am NPO After: 11am   IV Site: R Forearm  IV 0.9% NS with Angio Cath:  22g  Chest Size (in):  n/a IV Started by: Rolene Course, RN  Height: 4\' 11"  (1.499 m)  Cup Size: C; s/p R lumpectomy-no lymphectomy.  BMI:  Body mass index is 24.43 kg/(m^2). Weight:  121 lb (54.885 kg)   Tech Comments:  n/a    Nuclear Med Study 1 or 2 day study: 1 day  Stress Test Type:  Shields Provider:  Minus Breeding, MD   Resting Radionuclide: Technetium 61m Sestamibi  Resting Radionuclide Dose: 10.2 mCi   Stress Radionuclide:  Technetium 34m Sestamibi  Stress Radionuclide Dose: 29.8 mCi           Stress Protocol Rest HR: 52 Stress HR: 82  Rest BP: 122/74 Stress BP: 135/65  Exercise Time (min): n/a METS: n/a          Dose of Adenosine (mg):  n/a Dose of Lexiscan: 0.4 mg  Dose of Atropine (mg): n/a Dose of Dobutamine: n/a mcg/kg/min (at max HR)  Stress Test Technologist: Mellody Memos, CCT Nuclear Technologist: Imagene Riches, CNMT   Rest Procedure:  Myocardial perfusion imaging was performed at rest 45 minutes following the intravenous administration of Technetium 10m Sestamibi. Stress Procedure:  The patient received IV Lexiscan 0.4 mg over 15-seconds.  Technetium 22m Sestamibi injected IV at 30-seconds.  Patient experienced shortness of breath, nausea and was administered 75 mg of  Aminophylline IV at 5 minutes. There were no significant changes with Lexiscan.  Quantitative spect images were obtained after a 45 minute delay.  Transient Ischemic Dilatation (Normal <1.22):  1.07  QGS EDV:  45 ml QGS ESV:  17 ml LV Ejection Fraction: 63%     Rest ECG: NSR - Normal EKG  Stress ECG: No significant change from baseline ECG; rare PAC  QPS Raw Data Images:  Normal; no motion artifact; normal heart/lung ratio. Stress Images:  Normal homogeneous uptake in all areas of the myocardium. Rest Images:  Normal homogeneous uptake in all areas of the myocardium. Subtraction (SDS):  Normal  Impression Exercise Capacity:  Lexiscan with no exercise. BP Response:  Mild hypotension in early recovery associated with nausea Clinical Symptoms:  Mild shortness of breath ECG Impression:  No significant ST segment change suggestive of ischemia. Comparison with Prior Nuclear Study: No previous nuclear study performed  Overall Impression:  Normal stress nuclear study.  LV Wall Motion:  NL LV Function, EF 63%; NL Wall Motion   Lorimer Tiberio A, MD  11/29/2013 5:51 PM

## 2013-12-01 DIAGNOSIS — H35059 Retinal neovascularization, unspecified, unspecified eye: Secondary | ICD-10-CM | POA: Diagnosis not present

## 2013-12-01 DIAGNOSIS — H353290 Exudative age-related macular degeneration, unspecified eye, stage unspecified: Secondary | ICD-10-CM | POA: Diagnosis not present

## 2013-12-14 DIAGNOSIS — Q742 Other congenital malformations of lower limb(s), including pelvic girdle: Secondary | ICD-10-CM | POA: Diagnosis not present

## 2013-12-14 DIAGNOSIS — M79609 Pain in unspecified limb: Secondary | ICD-10-CM | POA: Diagnosis not present

## 2013-12-14 DIAGNOSIS — M2010 Hallux valgus (acquired), unspecified foot: Secondary | ICD-10-CM | POA: Diagnosis not present

## 2013-12-28 ENCOUNTER — Other Ambulatory Visit: Payer: Self-pay | Admitting: Dermatology

## 2013-12-28 DIAGNOSIS — D0439 Carcinoma in situ of skin of other parts of face: Secondary | ICD-10-CM | POA: Diagnosis not present

## 2013-12-28 DIAGNOSIS — D0430 Carcinoma in situ of skin of unspecified part of face: Secondary | ICD-10-CM | POA: Diagnosis not present

## 2013-12-28 DIAGNOSIS — R29818 Other symptoms and signs involving the nervous system: Secondary | ICD-10-CM | POA: Diagnosis not present

## 2014-01-16 DIAGNOSIS — H353290 Exudative age-related macular degeneration, unspecified eye, stage unspecified: Secondary | ICD-10-CM | POA: Diagnosis not present

## 2014-01-16 DIAGNOSIS — H35059 Retinal neovascularization, unspecified, unspecified eye: Secondary | ICD-10-CM | POA: Diagnosis not present

## 2014-03-05 DIAGNOSIS — R296 Repeated falls: Secondary | ICD-10-CM | POA: Diagnosis not present

## 2014-03-05 DIAGNOSIS — N3941 Urge incontinence: Secondary | ICD-10-CM | POA: Diagnosis not present

## 2014-03-06 DIAGNOSIS — N3941 Urge incontinence: Secondary | ICD-10-CM | POA: Diagnosis not present

## 2014-03-08 DIAGNOSIS — Z23 Encounter for immunization: Secondary | ICD-10-CM | POA: Diagnosis not present

## 2014-03-13 DIAGNOSIS — M1990 Unspecified osteoarthritis, unspecified site: Secondary | ICD-10-CM | POA: Diagnosis not present

## 2014-03-13 DIAGNOSIS — I2510 Atherosclerotic heart disease of native coronary artery without angina pectoris: Secondary | ICD-10-CM | POA: Diagnosis not present

## 2014-03-13 DIAGNOSIS — R42 Dizziness and giddiness: Secondary | ICD-10-CM | POA: Diagnosis not present

## 2014-03-13 DIAGNOSIS — I350 Nonrheumatic aortic (valve) stenosis: Secondary | ICD-10-CM | POA: Diagnosis not present

## 2014-03-13 DIAGNOSIS — I10 Essential (primary) hypertension: Secondary | ICD-10-CM | POA: Diagnosis not present

## 2014-03-13 DIAGNOSIS — Z9181 History of falling: Secondary | ICD-10-CM | POA: Diagnosis not present

## 2014-03-13 DIAGNOSIS — M818 Other osteoporosis without current pathological fracture: Secondary | ICD-10-CM | POA: Diagnosis not present

## 2014-03-14 DIAGNOSIS — D239 Other benign neoplasm of skin, unspecified: Secondary | ICD-10-CM | POA: Diagnosis not present

## 2014-03-14 DIAGNOSIS — H3532 Exudative age-related macular degeneration: Secondary | ICD-10-CM | POA: Diagnosis not present

## 2014-03-16 DIAGNOSIS — R42 Dizziness and giddiness: Secondary | ICD-10-CM | POA: Diagnosis not present

## 2014-03-16 DIAGNOSIS — M1990 Unspecified osteoarthritis, unspecified site: Secondary | ICD-10-CM | POA: Diagnosis not present

## 2014-03-16 DIAGNOSIS — I2510 Atherosclerotic heart disease of native coronary artery without angina pectoris: Secondary | ICD-10-CM | POA: Diagnosis not present

## 2014-03-16 DIAGNOSIS — M818 Other osteoporosis without current pathological fracture: Secondary | ICD-10-CM | POA: Diagnosis not present

## 2014-03-16 DIAGNOSIS — I10 Essential (primary) hypertension: Secondary | ICD-10-CM | POA: Diagnosis not present

## 2014-03-16 DIAGNOSIS — I350 Nonrheumatic aortic (valve) stenosis: Secondary | ICD-10-CM | POA: Diagnosis not present

## 2014-03-17 DIAGNOSIS — R42 Dizziness and giddiness: Secondary | ICD-10-CM | POA: Diagnosis not present

## 2014-03-17 DIAGNOSIS — I350 Nonrheumatic aortic (valve) stenosis: Secondary | ICD-10-CM | POA: Diagnosis not present

## 2014-03-17 DIAGNOSIS — I2510 Atherosclerotic heart disease of native coronary artery without angina pectoris: Secondary | ICD-10-CM | POA: Diagnosis not present

## 2014-03-17 DIAGNOSIS — I10 Essential (primary) hypertension: Secondary | ICD-10-CM | POA: Diagnosis not present

## 2014-03-17 DIAGNOSIS — M818 Other osteoporosis without current pathological fracture: Secondary | ICD-10-CM | POA: Diagnosis not present

## 2014-03-17 DIAGNOSIS — M1990 Unspecified osteoarthritis, unspecified site: Secondary | ICD-10-CM | POA: Diagnosis not present

## 2014-03-19 DIAGNOSIS — I350 Nonrheumatic aortic (valve) stenosis: Secondary | ICD-10-CM | POA: Diagnosis not present

## 2014-03-19 DIAGNOSIS — M1990 Unspecified osteoarthritis, unspecified site: Secondary | ICD-10-CM | POA: Diagnosis not present

## 2014-03-19 DIAGNOSIS — R42 Dizziness and giddiness: Secondary | ICD-10-CM | POA: Diagnosis not present

## 2014-03-19 DIAGNOSIS — I10 Essential (primary) hypertension: Secondary | ICD-10-CM | POA: Diagnosis not present

## 2014-03-19 DIAGNOSIS — M818 Other osteoporosis without current pathological fracture: Secondary | ICD-10-CM | POA: Diagnosis not present

## 2014-03-19 DIAGNOSIS — I2510 Atherosclerotic heart disease of native coronary artery without angina pectoris: Secondary | ICD-10-CM | POA: Diagnosis not present

## 2014-03-22 DIAGNOSIS — M818 Other osteoporosis without current pathological fracture: Secondary | ICD-10-CM | POA: Diagnosis not present

## 2014-03-22 DIAGNOSIS — M1990 Unspecified osteoarthritis, unspecified site: Secondary | ICD-10-CM | POA: Diagnosis not present

## 2014-03-22 DIAGNOSIS — I350 Nonrheumatic aortic (valve) stenosis: Secondary | ICD-10-CM | POA: Diagnosis not present

## 2014-03-22 DIAGNOSIS — I2510 Atherosclerotic heart disease of native coronary artery without angina pectoris: Secondary | ICD-10-CM | POA: Diagnosis not present

## 2014-03-22 DIAGNOSIS — I10 Essential (primary) hypertension: Secondary | ICD-10-CM | POA: Diagnosis not present

## 2014-03-22 DIAGNOSIS — R42 Dizziness and giddiness: Secondary | ICD-10-CM | POA: Diagnosis not present

## 2014-03-23 DIAGNOSIS — R42 Dizziness and giddiness: Secondary | ICD-10-CM | POA: Diagnosis not present

## 2014-03-23 DIAGNOSIS — M818 Other osteoporosis without current pathological fracture: Secondary | ICD-10-CM | POA: Diagnosis not present

## 2014-03-23 DIAGNOSIS — M1990 Unspecified osteoarthritis, unspecified site: Secondary | ICD-10-CM | POA: Diagnosis not present

## 2014-03-23 DIAGNOSIS — I2510 Atherosclerotic heart disease of native coronary artery without angina pectoris: Secondary | ICD-10-CM | POA: Diagnosis not present

## 2014-03-23 DIAGNOSIS — I10 Essential (primary) hypertension: Secondary | ICD-10-CM | POA: Diagnosis not present

## 2014-03-23 DIAGNOSIS — I350 Nonrheumatic aortic (valve) stenosis: Secondary | ICD-10-CM | POA: Diagnosis not present

## 2014-03-26 DIAGNOSIS — I2510 Atherosclerotic heart disease of native coronary artery without angina pectoris: Secondary | ICD-10-CM | POA: Diagnosis not present

## 2014-03-26 DIAGNOSIS — M818 Other osteoporosis without current pathological fracture: Secondary | ICD-10-CM | POA: Diagnosis not present

## 2014-03-26 DIAGNOSIS — I10 Essential (primary) hypertension: Secondary | ICD-10-CM | POA: Diagnosis not present

## 2014-03-26 DIAGNOSIS — M1990 Unspecified osteoarthritis, unspecified site: Secondary | ICD-10-CM | POA: Diagnosis not present

## 2014-03-26 DIAGNOSIS — I350 Nonrheumatic aortic (valve) stenosis: Secondary | ICD-10-CM | POA: Diagnosis not present

## 2014-03-26 DIAGNOSIS — R42 Dizziness and giddiness: Secondary | ICD-10-CM | POA: Diagnosis not present

## 2014-03-28 DIAGNOSIS — I10 Essential (primary) hypertension: Secondary | ICD-10-CM | POA: Diagnosis not present

## 2014-03-28 DIAGNOSIS — R42 Dizziness and giddiness: Secondary | ICD-10-CM | POA: Diagnosis not present

## 2014-03-28 DIAGNOSIS — I2510 Atherosclerotic heart disease of native coronary artery without angina pectoris: Secondary | ICD-10-CM | POA: Diagnosis not present

## 2014-03-28 DIAGNOSIS — I350 Nonrheumatic aortic (valve) stenosis: Secondary | ICD-10-CM | POA: Diagnosis not present

## 2014-03-28 DIAGNOSIS — M1990 Unspecified osteoarthritis, unspecified site: Secondary | ICD-10-CM | POA: Diagnosis not present

## 2014-03-28 DIAGNOSIS — M818 Other osteoporosis without current pathological fracture: Secondary | ICD-10-CM | POA: Diagnosis not present

## 2014-03-29 DIAGNOSIS — I10 Essential (primary) hypertension: Secondary | ICD-10-CM | POA: Diagnosis not present

## 2014-03-29 DIAGNOSIS — E039 Hypothyroidism, unspecified: Secondary | ICD-10-CM | POA: Diagnosis not present

## 2014-03-29 DIAGNOSIS — J302 Other seasonal allergic rhinitis: Secondary | ICD-10-CM | POA: Diagnosis not present

## 2014-03-29 DIAGNOSIS — R296 Repeated falls: Secondary | ICD-10-CM | POA: Diagnosis not present

## 2014-03-29 DIAGNOSIS — N3941 Urge incontinence: Secondary | ICD-10-CM | POA: Diagnosis not present

## 2014-04-02 DIAGNOSIS — I350 Nonrheumatic aortic (valve) stenosis: Secondary | ICD-10-CM | POA: Diagnosis not present

## 2014-04-02 DIAGNOSIS — I10 Essential (primary) hypertension: Secondary | ICD-10-CM | POA: Diagnosis not present

## 2014-04-02 DIAGNOSIS — M818 Other osteoporosis without current pathological fracture: Secondary | ICD-10-CM | POA: Diagnosis not present

## 2014-04-02 DIAGNOSIS — M1990 Unspecified osteoarthritis, unspecified site: Secondary | ICD-10-CM | POA: Diagnosis not present

## 2014-04-02 DIAGNOSIS — R42 Dizziness and giddiness: Secondary | ICD-10-CM | POA: Diagnosis not present

## 2014-04-02 DIAGNOSIS — I2510 Atherosclerotic heart disease of native coronary artery without angina pectoris: Secondary | ICD-10-CM | POA: Diagnosis not present

## 2014-04-03 DIAGNOSIS — M1990 Unspecified osteoarthritis, unspecified site: Secondary | ICD-10-CM | POA: Diagnosis not present

## 2014-04-03 DIAGNOSIS — I10 Essential (primary) hypertension: Secondary | ICD-10-CM | POA: Diagnosis not present

## 2014-04-03 DIAGNOSIS — I350 Nonrheumatic aortic (valve) stenosis: Secondary | ICD-10-CM | POA: Diagnosis not present

## 2014-04-03 DIAGNOSIS — M818 Other osteoporosis without current pathological fracture: Secondary | ICD-10-CM | POA: Diagnosis not present

## 2014-04-03 DIAGNOSIS — I2510 Atherosclerotic heart disease of native coronary artery without angina pectoris: Secondary | ICD-10-CM | POA: Diagnosis not present

## 2014-04-03 DIAGNOSIS — R42 Dizziness and giddiness: Secondary | ICD-10-CM | POA: Diagnosis not present

## 2014-04-10 DIAGNOSIS — R42 Dizziness and giddiness: Secondary | ICD-10-CM | POA: Diagnosis not present

## 2014-04-10 DIAGNOSIS — I2510 Atherosclerotic heart disease of native coronary artery without angina pectoris: Secondary | ICD-10-CM | POA: Diagnosis not present

## 2014-04-10 DIAGNOSIS — I10 Essential (primary) hypertension: Secondary | ICD-10-CM | POA: Diagnosis not present

## 2014-04-10 DIAGNOSIS — M1990 Unspecified osteoarthritis, unspecified site: Secondary | ICD-10-CM | POA: Diagnosis not present

## 2014-04-10 DIAGNOSIS — I350 Nonrheumatic aortic (valve) stenosis: Secondary | ICD-10-CM | POA: Diagnosis not present

## 2014-04-10 DIAGNOSIS — M818 Other osteoporosis without current pathological fracture: Secondary | ICD-10-CM | POA: Diagnosis not present

## 2014-05-22 DIAGNOSIS — H3532 Exudative age-related macular degeneration: Secondary | ICD-10-CM | POA: Diagnosis not present

## 2014-05-22 DIAGNOSIS — H43813 Vitreous degeneration, bilateral: Secondary | ICD-10-CM | POA: Diagnosis not present

## 2014-06-07 ENCOUNTER — Other Ambulatory Visit: Payer: Self-pay | Admitting: Family Medicine

## 2014-06-07 ENCOUNTER — Ambulatory Visit
Admission: RE | Admit: 2014-06-07 | Discharge: 2014-06-07 | Disposition: A | Discharge status: Home / Self Care | Payer: Medicare Other | Source: Ambulatory Visit | Attending: Family Medicine | Admitting: Family Medicine

## 2014-06-07 DIAGNOSIS — S0083XA Contusion of other part of head, initial encounter: Secondary | ICD-10-CM | POA: Diagnosis not present

## 2014-06-07 DIAGNOSIS — W19XXXA Unspecified fall, initial encounter: Secondary | ICD-10-CM

## 2014-06-07 DIAGNOSIS — S0990XA Unspecified injury of head, initial encounter: Secondary | ICD-10-CM | POA: Diagnosis not present

## 2014-06-07 DIAGNOSIS — S0093XA Contusion of unspecified part of head, initial encounter: Secondary | ICD-10-CM | POA: Diagnosis not present

## 2014-06-07 DIAGNOSIS — R52 Pain, unspecified: Secondary | ICD-10-CM

## 2014-06-07 DIAGNOSIS — M542 Cervicalgia: Secondary | ICD-10-CM | POA: Diagnosis not present

## 2014-06-25 DIAGNOSIS — R350 Frequency of micturition: Secondary | ICD-10-CM | POA: Diagnosis not present

## 2014-06-25 DIAGNOSIS — R42 Dizziness and giddiness: Secondary | ICD-10-CM | POA: Diagnosis not present

## 2014-06-25 DIAGNOSIS — R8290 Unspecified abnormal findings in urine: Secondary | ICD-10-CM | POA: Diagnosis not present

## 2014-06-27 DIAGNOSIS — J3081 Allergic rhinitis due to animal (cat) (dog) hair and dander: Secondary | ICD-10-CM | POA: Diagnosis not present

## 2014-06-27 DIAGNOSIS — J301 Allergic rhinitis due to pollen: Secondary | ICD-10-CM | POA: Diagnosis not present

## 2014-06-27 DIAGNOSIS — H903 Sensorineural hearing loss, bilateral: Secondary | ICD-10-CM | POA: Diagnosis not present

## 2014-06-27 DIAGNOSIS — H8143 Vertigo of central origin, bilateral: Secondary | ICD-10-CM | POA: Diagnosis not present

## 2014-07-05 DIAGNOSIS — H903 Sensorineural hearing loss, bilateral: Secondary | ICD-10-CM | POA: Diagnosis not present

## 2014-07-05 DIAGNOSIS — H9313 Tinnitus, bilateral: Secondary | ICD-10-CM | POA: Diagnosis not present

## 2014-07-05 DIAGNOSIS — H8143 Vertigo of central origin, bilateral: Secondary | ICD-10-CM | POA: Diagnosis not present

## 2014-07-09 DIAGNOSIS — S0990XA Unspecified injury of head, initial encounter: Secondary | ICD-10-CM | POA: Diagnosis not present

## 2014-07-09 DIAGNOSIS — R42 Dizziness and giddiness: Secondary | ICD-10-CM | POA: Diagnosis not present

## 2014-08-13 DIAGNOSIS — I10 Essential (primary) hypertension: Secondary | ICD-10-CM | POA: Diagnosis not present

## 2014-08-13 DIAGNOSIS — R351 Nocturia: Secondary | ICD-10-CM | POA: Diagnosis not present

## 2014-08-14 ENCOUNTER — Other Ambulatory Visit: Payer: Self-pay | Admitting: Dermatology

## 2014-08-14 DIAGNOSIS — D0430 Carcinoma in situ of skin of unspecified part of face: Secondary | ICD-10-CM | POA: Diagnosis not present

## 2014-08-14 DIAGNOSIS — D0462 Carcinoma in situ of skin of left upper limb, including shoulder: Secondary | ICD-10-CM | POA: Diagnosis not present

## 2014-08-14 DIAGNOSIS — L570 Actinic keratosis: Secondary | ICD-10-CM | POA: Diagnosis not present

## 2014-08-14 DIAGNOSIS — R351 Nocturia: Secondary | ICD-10-CM | POA: Diagnosis not present

## 2014-08-16 DIAGNOSIS — M2011 Hallux valgus (acquired), right foot: Secondary | ICD-10-CM | POA: Diagnosis not present

## 2014-08-16 DIAGNOSIS — M2012 Hallux valgus (acquired), left foot: Secondary | ICD-10-CM | POA: Diagnosis not present

## 2014-08-16 DIAGNOSIS — M2042 Other hammer toe(s) (acquired), left foot: Secondary | ICD-10-CM | POA: Diagnosis not present

## 2014-08-16 DIAGNOSIS — M71571 Other bursitis, not elsewhere classified, right ankle and foot: Secondary | ICD-10-CM | POA: Diagnosis not present

## 2014-08-16 DIAGNOSIS — M79609 Pain in unspecified limb: Secondary | ICD-10-CM | POA: Diagnosis not present

## 2014-08-16 DIAGNOSIS — M2041 Other hammer toe(s) (acquired), right foot: Secondary | ICD-10-CM | POA: Diagnosis not present

## 2014-08-30 DIAGNOSIS — Z23 Encounter for immunization: Secondary | ICD-10-CM | POA: Diagnosis not present

## 2014-08-30 DIAGNOSIS — Z0000 Encounter for general adult medical examination without abnormal findings: Secondary | ICD-10-CM | POA: Diagnosis not present

## 2014-08-30 DIAGNOSIS — M810 Age-related osteoporosis without current pathological fracture: Secondary | ICD-10-CM | POA: Diagnosis not present

## 2014-08-30 DIAGNOSIS — M25519 Pain in unspecified shoulder: Secondary | ICD-10-CM | POA: Diagnosis not present

## 2014-08-30 DIAGNOSIS — E039 Hypothyroidism, unspecified: Secondary | ICD-10-CM | POA: Diagnosis not present

## 2014-08-30 DIAGNOSIS — I10 Essential (primary) hypertension: Secondary | ICD-10-CM | POA: Diagnosis not present

## 2014-09-12 ENCOUNTER — Ambulatory Visit: Payer: Medicare Other | Admitting: Physical Therapy

## 2014-09-19 ENCOUNTER — Ambulatory Visit: Payer: Medicare Other | Attending: Family Medicine | Admitting: Physical Therapy

## 2014-09-19 ENCOUNTER — Encounter: Payer: Self-pay | Admitting: Physical Therapy

## 2014-09-19 DIAGNOSIS — R296 Repeated falls: Secondary | ICD-10-CM | POA: Insufficient documentation

## 2014-09-19 DIAGNOSIS — M25511 Pain in right shoulder: Secondary | ICD-10-CM | POA: Insufficient documentation

## 2014-09-19 NOTE — Therapy (Signed)
[facility] [place], [place], [address] Phone: [phone]   Fax:  [phone]  Physical Therapy Evaluation  Patient Details  Name: Alice Hayes MRN: [id number] Date of Birth: 03/26/1923 Referring Provider:  Gavin Pound, MD  Encounter Date: 09/19/2014      PT End of Session - 09/19/14 1055    Visit Number 1   Date for PT Re-Evaluation 11/19/14   PT Start Time 1014   PT Stop Time 1113   PT Time Calculation (min) 59 min      Past Medical History  Diagnosis Date  . HTN (hypertension)   . Melanoma   . CAD (coronary artery disease)     nonobstructive cath 2006  . Cholelithiases   . Osteoporosis   . COPD (chronic obstructive pulmonary disease)   . Hyperlipidemia   . Osteoarthritis   . Diastolic heart failure     Past Surgical History  Procedure Laterality Date  . Appendectomy    . Breast lumpectomy    . Tonsillectomy and adenoidectomy    . Parotidectomy      1959  . Bladder suspension      2006  . Carpal tunnel release      2007  . Melanoma excision      2008  . Lymph node biopsy    . Cataract extraction      There were no vitals filed for this visit.  Visit Diagnosis:  Right shoulder pain - Plan: PT plan of care cert/re-cert  Falls frequently - Plan: PT plan of care cert/re-cert      Subjective Assessment - 09/19/14 1021    Subjective Reports right shoulder pain over a 3 year period, she does report some frequent falls and is concerned about her balance   Pertinent History frequent falls   Limitations Standing;Walking;Lifting;House hold activities   Diagnostic tests x-rays show arthritic changes   Patient Stated Goals no pain and no difficulty with dressing   Currently in Pain? Yes   Pain Score 5    Pain Location Shoulder   Pain Orientation Right   Pain Descriptors / Indicators Aching   Pain Type Chronic pain   Pain Onset More than a month ago   Pain Frequency  Intermittent   Aggravating Factors  worse reaching behind and up   Pain Relieving Factors past PT treatment   Effect of Pain on Daily Activities limits everything, afraid of falling            [facility] PT Assessment - 09/19/14 0001    Assessment   Medical Diagnosis right shoulder pain, falls   Onset Date 07/21/14   Prior Therapy last year   Precautions   Precautions Fall   Precaution Comments has had frequent falls   Balance Screen   Has the patient fallen in the past 6 months Yes   How many times? 9   Has the patient had a decrease in activity level because of a fear of falling?  Yes   Is the patient reluctant to leave their home because of a fear of falling?  Yes   [place] Private residence   Living Arrangements Alone   Type of [place] One level   [place] - quad   Additional Comments she has an aide 3 hour 3 days a week   Prior Function   Level of Independence Independent with basic ADLs;Independent with  homemaking with ambulation   Vocation Retired   Charity fundraiser Status History of cognitive impairments - at baseline   Attention Focused   Memory --   ROM / Strength   AROM / PROM / Strength --  strength of right shoulder was4-/5 with pain for all motions   AROM   Right Shoulder Flexion 120 Degrees   Right Shoulder ABduction 110 Degrees   Right Shoulder Internal Rotation 50 Degrees   Right Shoulder External Rotation 65 Degrees   Palpation   Palpation tender around the right shoulder, into the upper traps   Special Tests   Rotator Cuff Impingment tests Michel Bickers test   Hawkins-Kennedy test   Findings Positive   Side Right   Standardized Balance Assessment   Standardized Balance Assessment Timed Up and Go Test   Berg Balance Test   Sit to Stand Able to stand using hands after several tries   Standing Unsupported Able to stand 2 minutes with supervision   Sitting with Back Unsupported  but Feet Supported on Floor or Stool Able to sit safely and securely 2 minutes   Stand to Sit Uses backs of legs against chair to control descent   Transfers Able to transfer safely, definite need of hands   Standing Unsupported with Eyes Closed Able to stand 10 seconds with supervision   Standing Ubsupported with Feet Together Able to place feet together independently and stand 1 minute safely   From Standing, Reach Forward with Outstretched Arm Can reach forward >12 cm safely (5")   From Standing Position, Pick up Object from Floor Able to pick up shoe safely and easily   From Standing Position, Turn to Look Behind Over each Shoulder Looks behind one side only/other side shows less weight shift   Turn 360 Degrees Able to turn 360 degrees safely one side only in 4 seconds or less   Standing Unsupported, Alternately Place Feet on Step/Stool Able to complete >2 steps/needs minimal assist   Standing Unsupported, One Foot in Front Able to take small step independently and hold 30 seconds   Standing on One Leg Tries to lift leg/unable to hold 3 seconds but remains standing independently   Total Score 38   Timed Up and Go Test   Normal TUG (seconds) 24                   [facility] Adult PT Treatment/Exercise - 09/19/14 0001    Ambulation/Gait   Gait Comments slow, shuffling patter, fwd head and rounded shoulders, unsteady with turns   Modalities   Modalities Electrical Stimulation;Moist Heat   Moist Heat Therapy   Number Minutes Moist Heat 15 Minutes   Moist Heat Location Shoulder   Electrical Stimulation   Electrical Stimulation Location right sholulder   Electrical Stimulation Parameters IFC   Electrical Stimulation Goals Pain                  PT Short Term Goals - 09/19/14 1057    PT SHORT TERM GOAL #1   Title independent with initial HEP   Time 2   Period Weeks   Status New           PT Long Term Goals - 09/19/14 1058    PT LONG TERM GOAL #1   Title  independent with advance HEP to include balance   Time 8   Period Weeks   Status New   PT LONG TERM GOAL #2   Title decrease  pain in the right shoulder 50%   Time 8   Period Weeks   Status New   PT LONG TERM GOAL #3   Title increase IR to better dress herself to 66 degrees   Time 8   Period Weeks   Status New   PT LONG TERM GOAL #5   Title no falls over a 4 week period   Time 8   Period Weeks   Status New               Plan - September 29, 2014 1055    Clinical Impression Statement Treat the right shoulder as impingement, she will also need to have balance activities to assure her safety and try to prevent future falls   Pt will benefit from skilled therapeutic intervention in order to improve on the following deficits Abnormal gait;Decreased balance;Difficulty walking;Decreased range of motion;Decreased strength;Impaired flexibility;Increased muscle spasms;Impaired UE functional use;Pain   Rehab Potential Good   PT Frequency 2x / week   PT Duration 8 weeks   PT Treatment/Interventions Ultrasound;Cryotherapy;Moist Heat;Electrical Stimulation;Gait training;Therapeutic exercise;Balance training;Neuromuscular re-education;Patient/family education;Manual techniques   PT Next Visit Plan work on balance and functional activity   Consulted and Agree with Plan of Care Patient          G-Codes - 09-29-2014 1059    Functional Assessment Tool Used FOTO   Functional Limitation Other PT primary   Mobility: Walking and Moving Around Current Status (P5361) At least 20 percent but less than 40 percent impaired, limited or restricted   Mobility: Walking and Moving Around Goal Status (W4315) At least 40 percent but less than 60 percent impaired, limited or restricted       Problem List Patient Active Problem List   Diagnosis Date Noted  . Dyspnea 11/23/2013    Sumner Boast, PT 29-Sep-2014, 11:17 AM  [place] [address] [facility] [place], [place], [address] Phone: [phone]   Fax:  [phone]

## 2014-09-25 DIAGNOSIS — H43813 Vitreous degeneration, bilateral: Secondary | ICD-10-CM | POA: Diagnosis not present

## 2014-09-25 DIAGNOSIS — H542 Low vision, both eyes: Secondary | ICD-10-CM | POA: Diagnosis not present

## 2014-09-25 DIAGNOSIS — H3532 Exudative age-related macular degeneration: Secondary | ICD-10-CM | POA: Diagnosis not present

## 2014-09-25 DIAGNOSIS — H53419 Scotoma involving central area, unspecified eye: Secondary | ICD-10-CM | POA: Diagnosis not present

## 2014-09-26 ENCOUNTER — Encounter: Payer: Self-pay | Admitting: Physical Therapy

## 2014-09-26 ENCOUNTER — Ambulatory Visit: Payer: Medicare Other | Admitting: Physical Therapy

## 2014-09-26 DIAGNOSIS — R296 Repeated falls: Secondary | ICD-10-CM

## 2014-09-26 DIAGNOSIS — M25511 Pain in right shoulder: Secondary | ICD-10-CM | POA: Diagnosis not present

## 2014-09-26 NOTE — Therapy (Signed)
[place], [place], [address] Phone: [phone]   Fax:  [phone]  Physical Therapy Treatment  Patient Details  Name: Alice Hayes MRN: [id number] Date of Birth: 10-31-1922 Referring Provider:  Gavin Pound, MD  Encounter Date: 09/26/2014      PT End of Session - 09/26/14 1114    Visit Number 2   PT Start Time 1010   PT Stop Time 1111   PT Time Calculation (min) 61 min      Past Medical History  Diagnosis Date  . HTN (hypertension)   . Melanoma   . CAD (coronary artery disease)     nonobstructive cath 2006  . Cholelithiases   . Osteoporosis   . COPD (chronic obstructive pulmonary disease)   . Hyperlipidemia   . Osteoarthritis   . Diastolic heart failure     Past Surgical History  Procedure Laterality Date  . Appendectomy    . Breast lumpectomy    . Tonsillectomy and adenoidectomy    . Parotidectomy      1959  . Bladder suspension      2006  . Carpal tunnel release      2007  . Melanoma excision      2008  . Lymph node biopsy    . Cataract extraction      There were no vitals filed for this visit.  Visit Diagnosis:  Right shoulder pain  Falls frequently      Subjective Assessment - 09/26/14 1011    Subjective I'm here, it is hot, pain in the right shoulder 3-4/10, no falls   Currently in Pain? Yes   Pain Score 4    Pain Location Shoulder   Pain Orientation Right   Pain Descriptors / Indicators Aching   Pain Type Chronic pain                         [facility] Adult PT Treatment/Exercise - 09/26/14 0001    High Level Balance   High Level Balance Activities Side stepping;Backward walking;Turns;Head turns;Tandem walking;Marching forwards   High Level Balance Comments ball tosses, airex, head turns   Knee/Hip Exercises: Aerobic   Isokinetic NuStep L2 x 5 minutes   Knee/Hip Exercises: Seated   Long Arc Quad 2 sets;Both;10 reps   Other  Seated Knee Exercises seated marches 3#   Other Seated Knee Exercises ball b/n knees squeeze, green tband seated scapular stabilization exercises   Moist Heat Therapy   Number Minutes Moist Heat 15 Minutes   Moist Heat Location Shoulder   Electrical Stimulation   Electrical Stimulation Location right sholulder   Electrical Stimulation Parameters IFC   Electrical Stimulation Goals Pain                  PT Short Term Goals - 09/19/14 1057    PT SHORT TERM GOAL #1   Title independent with initial HEP   Time 2   Period Weeks   Status New           PT Long Term Goals - 09/19/14 1058    PT LONG TERM GOAL #1   Title independent with advance HEP to include balance   Time 8   Period Weeks   Status New   PT LONG TERM GOAL #2   Title decrease pain in the right shoulder 50%   Time 8   Period Weeks   Status New  PT LONG TERM GOAL #3   Title increase IR to better dress herself to 66 degrees   Time 8   Period Weeks   Status New   PT LONG TERM GOAL #5   Title no falls over a 4 week period   Time 8   Period Weeks   Status New               Plan - 09/26/14 1115    Clinical Impression Statement Has some balance issues, difficulty with head turns and difficulty with airex   PT Next Visit Plan work on balance and functional activity   Consulted and Agree with Plan of Care Patient        Problem List Patient Active Problem List   Diagnosis Date Noted  . Dyspnea 11/23/2013    Lum Babe W,PT 09/26/2014, 11:17 AM  [place] [place], [place], [address] Phone: [phone]   Fax:  [phone]

## 2014-10-02 DIAGNOSIS — H53419 Scotoma involving central area, unspecified eye: Secondary | ICD-10-CM | POA: Diagnosis not present

## 2014-10-02 DIAGNOSIS — H542 Low vision, both eyes: Secondary | ICD-10-CM | POA: Diagnosis not present

## 2014-10-03 ENCOUNTER — Ambulatory Visit: Payer: Medicare Other | Attending: Family Medicine | Admitting: Rehabilitation

## 2014-10-03 DIAGNOSIS — M25511 Pain in right shoulder: Secondary | ICD-10-CM | POA: Insufficient documentation

## 2014-10-03 DIAGNOSIS — R296 Repeated falls: Secondary | ICD-10-CM | POA: Diagnosis not present

## 2014-10-03 NOTE — Therapy (Signed)
[place], [place], [address] Phone: [phone]   Fax:  [phone]  Physical Therapy Treatment  Patient Details  Name: Alice Hayes MRN: [id number] Date of Birth: Jul 31, 1922 Referring Provider:  Gavin Pound, MD  Encounter Date: 10/03/2014      PT End of Session - 10/03/14 1105    Visit Number 3   Date for PT Re-Evaluation 11/19/14   PT Start Time 1104   PT Stop Time 1200   PT Time Calculation (min) 56 min   Activity Tolerance Patient tolerated treatment well;Patient limited by pain   Behavior During Therapy [facility] for tasks assessed/performed      Past Medical History  Diagnosis Date  . HTN (hypertension)   . Melanoma   . CAD (coronary artery disease)     nonobstructive cath 2006  . Cholelithiases   . Osteoporosis   . COPD (chronic obstructive pulmonary disease)   . Hyperlipidemia   . Osteoarthritis   . Diastolic heart failure     Past Surgical History  Procedure Laterality Date  . Appendectomy    . Breast lumpectomy    . Tonsillectomy and adenoidectomy    . Parotidectomy      1959  . Bladder suspension      2006  . Carpal tunnel release      2007  . Melanoma excision      2008  . Lymph node biopsy    . Cataract extraction      There were no vitals filed for this visit.  Visit Diagnosis:  Right shoulder pain  Falls frequently      Subjective Assessment - 10/03/14 1107    Subjective My right shoulder hurts, no falls   Currently in Pain? Yes   Pain Score 6    Pain Location Shoulder   Pain Orientation Right   Pain Descriptors / Indicators Aching   Pain Type Chronic pain   Aggravating Factors  reaching behind and up   Pain Relieving Factors coming out of that position            [facility] PT Assessment - 10/03/14 0001    AROM   Right Shoulder Internal Rotation 70 Degrees  supine                     [facility] Adult PT Treatment/Exercise -  10/03/14 0001    Exercises   Exercises Shoulder   Knee/Hip Exercises: Aerobic   Isokinetic NuStep L2 x 5 minutes   Knee/Hip Exercises: Seated   Other Seated Knee Exercises seated marching 3# on dyna disc and arms in flexion 3x10   Shoulder Exercises: Supine   Horizontal ABduction 20 reps;Theraband  green band laying on 1/2 foam roller   Flexion 10 reps  on 1/2 foam roller hurts shoulder   Theraband Level (Shoulder Flexion) Level 3 (Green)   Other Supine Exercises Rythmic stab attempted but basically did isometrics for directions   Other Supine Exercises attempted D1/D2 pnf patterns but painful   Shoulder Exercises: Seated   Theraband Level (Shoulder Row) Level 3 (Green)  x30 on dynadisc   Horizontal ABduction 20 reps  on dyna disc   Theraband Level (Shoulder Horizontal ABduction) Level 3 (Green)   Other Seated Exercises Robber x 20 (bil ER ) on dynadisc   Modalities   Modalities Electrical Stimulation;Moist Heat   Moist Heat Therapy   Number Minutes Moist Heat 15 Minutes   Moist Heat  Location Shoulder   Electrical Stimulation   Electrical Stimulation Location Rt shoulder   Electrical Stimulation Parameters IFC to tolerance   Electrical Stimulation Goals Pain                  PT Short Term Goals - 09/19/14 1057    PT SHORT TERM GOAL #1   Title independent with initial HEP   Time 2   Period Weeks   Status New           PT Long Term Goals - 10/03/14 1203    PT LONG TERM GOAL #1   Title independent with advance HEP to include balance   Period Weeks   Status On-going   PT LONG TERM GOAL #2   Title decrease pain in the right shoulder 50%   Time 8   Status On-going   PT LONG TERM GOAL #3   Title increase IR to better dress herself to 66 degrees   Time 8   Period Weeks   Status Achieved               Plan - 10/03/14 1150    Clinical Impression Statement Good tolerance to core exercises in sitting and supine. Patient limited by shoulder pain for  some scapular stabilization exercises on foam roller. ROM improving. LTG #3 met.   Pt will benefit from skilled therapeutic intervention in order to improve on the following deficits Abnormal gait;Decreased balance;Difficulty walking;Decreased range of motion;Decreased strength;Impaired flexibility;Increased muscle spasms;Impaired UE functional use;Pain   Rehab Potential Good   PT Duration 8 weeks   PT Treatment/Interventions Ultrasound;Cryotherapy;Moist Heat;Electrical Stimulation;Gait training;Therapeutic exercise;Balance training;Neuromuscular re-education;Patient/family education;Manual techniques   PT Next Visit Plan  work on balance, functional activity and core strength and add HEP   Consulted and Agree with Plan of Care Patient        Problem List Patient Active Problem List   Diagnosis Date Noted  . Dyspnea 11/23/2013    Madelyn Flavors PT  10/03/2014, 12:18 PM  [place] [address] [place], [place], [address] Phone: [phone]   Fax:  [phone]

## 2014-10-04 ENCOUNTER — Ambulatory Visit: Payer: Medicare Other | Admitting: Physical Therapy

## 2014-10-04 ENCOUNTER — Encounter: Payer: Self-pay | Admitting: Physical Therapy

## 2014-10-04 DIAGNOSIS — M25511 Pain in right shoulder: Secondary | ICD-10-CM

## 2014-10-04 DIAGNOSIS — R296 Repeated falls: Secondary | ICD-10-CM | POA: Diagnosis not present

## 2014-10-04 NOTE — Therapy (Signed)
[place], [place], [address] Phone: [phone]   Fax:  [phone]  Physical Therapy Treatment  Patient Details  Name: Alice Hayes MRN: [id number] Date of Birth: 27-Aug-1922 Referring Provider:  Gavin Pound, MD  Encounter Date: 10/04/2014      PT End of Session - 10/04/14 1139    Visit Number 4   PT Start Time 1100   PT Stop Time 0175   PT Time Calculation (min) 55 min      Past Medical History  Diagnosis Date  . HTN (hypertension)   . Melanoma   . CAD (coronary artery disease)     nonobstructive cath 2006  . Cholelithiases   . Osteoporosis   . COPD (chronic obstructive pulmonary disease)   . Hyperlipidemia   . Osteoarthritis   . Diastolic heart failure     Past Surgical History  Procedure Laterality Date  . Appendectomy    . Breast lumpectomy    . Tonsillectomy and adenoidectomy    . Parotidectomy      1959  . Bladder suspension      2006  . Carpal tunnel release      2007  . Melanoma excision      2008  . Lymph node biopsy    . Cataract extraction      There were no vitals filed for this visit.  Visit Diagnosis:  Right shoulder pain      Subjective Assessment - 10/04/14 1109    Subjective my shoulder felt really good after last session   Currently in Pain? Yes   Pain Score 3    Pain Location Shoulder   Pain Orientation Right            [facility] PT Assessment - 10/04/14 0001    AROM   Right Shoulder Flexion 130 Degrees   Right Shoulder ABduction 115 Degrees                     [facility] Adult PT Treatment/Exercise - 10/04/14 0001    High Level Balance   High Level Balance Activities --  OH ball toss in standing with red physio ball   High Level Balance Comments ball tosses, airex, head turns   Knee/Hip Exercises: Aerobic   Isokinetic NuStep L5 x 6 minutes   Shoulder Exercises: Standing   Other Standing Exercises red tband scap 3 way  (ext,retraction, abd), red tband ER 15 reps, 2# flex,abd, ER,. standing IR 3# behind back 15 times   Other Standing Exercises wall angles 2 sets 10, 3# backward shoulder rolls and shruggs   Shoulder Exercises: Power Warden/ranger Exercises lat pull 15 # 2 sets 10, seated row 15# 2 sets 10   Moist Heat Therapy   Number Minutes Moist Heat 15 Minutes   Moist Heat Location Shoulder  supine   Electrical Stimulation   Electrical Stimulation Location Rt shoulder   Electrical Stimulation Parameters IFC   Electrical Stimulation Goals Pain                  PT Short Term Goals - 10/04/14 1141    PT SHORT TERM GOAL #1   Title independent with initial HEP   Status Achieved           PT Long Term Goals - 10/03/14 1203    PT LONG TERM GOAL #1   Title independent with advance HEP to include  balance   Period Weeks   Status On-going   PT LONG TERM GOAL #2   Title decrease pain in the right shoulder 50%   Time 8   Status On-going   PT LONG TERM GOAL #3   Title increase IR to better dress herself to 66 degrees   Time 8   Period Weeks   Status Achieved               Plan - 10/04/14 1139    Clinical Impression Statement Good tolerance to UE ther ex with resistance. Posture limitations with UE ther ex, very kyphotic. Pt tends to lean backwards on hip flexors to achieve upright posture and maintain balance.   PT Next Visit Plan  work on balance, functional activity and core strength and add HEP        Problem List Patient Active Problem List   Diagnosis Date Noted  . Dyspnea 11/23/2013    PAYSEUR,ANGIE PTA 10/04/2014, 11:42 AM  [address] [address] [place], [place], [address] Phone: [phone]   Fax:  [phone]

## 2014-10-09 DIAGNOSIS — H53419 Scotoma involving central area, unspecified eye: Secondary | ICD-10-CM | POA: Diagnosis not present

## 2014-10-09 DIAGNOSIS — H542 Low vision, both eyes: Secondary | ICD-10-CM | POA: Diagnosis not present

## 2014-10-11 ENCOUNTER — Ambulatory Visit: Payer: Medicare Other | Admitting: Physical Therapy

## 2014-10-12 ENCOUNTER — Ambulatory Visit: Payer: Medicare Other | Admitting: Physical Therapy

## 2014-10-17 DIAGNOSIS — H542 Low vision, both eyes: Secondary | ICD-10-CM | POA: Diagnosis not present

## 2014-10-17 DIAGNOSIS — H53419 Scotoma involving central area, unspecified eye: Secondary | ICD-10-CM | POA: Diagnosis not present

## 2014-10-24 DIAGNOSIS — H53419 Scotoma involving central area, unspecified eye: Secondary | ICD-10-CM | POA: Diagnosis not present

## 2014-10-24 DIAGNOSIS — H542 Low vision, both eyes: Secondary | ICD-10-CM | POA: Diagnosis not present

## 2014-10-25 DIAGNOSIS — L84 Corns and callosities: Secondary | ICD-10-CM | POA: Diagnosis not present

## 2014-10-25 DIAGNOSIS — L603 Nail dystrophy: Secondary | ICD-10-CM | POA: Diagnosis not present

## 2014-10-25 DIAGNOSIS — I739 Peripheral vascular disease, unspecified: Secondary | ICD-10-CM | POA: Diagnosis not present

## 2014-10-31 DIAGNOSIS — H3532 Exudative age-related macular degeneration: Secondary | ICD-10-CM | POA: Diagnosis not present

## 2014-10-31 DIAGNOSIS — Z961 Presence of intraocular lens: Secondary | ICD-10-CM | POA: Diagnosis not present

## 2014-10-31 DIAGNOSIS — H43813 Vitreous degeneration, bilateral: Secondary | ICD-10-CM | POA: Diagnosis not present

## 2014-11-01 DIAGNOSIS — H53419 Scotoma involving central area, unspecified eye: Secondary | ICD-10-CM | POA: Diagnosis not present

## 2014-11-01 DIAGNOSIS — H542 Low vision, both eyes: Secondary | ICD-10-CM | POA: Diagnosis not present

## 2014-11-05 ENCOUNTER — Ambulatory Visit: Payer: Medicare Other | Attending: Family Medicine | Admitting: Physical Therapy

## 2014-11-05 DIAGNOSIS — R296 Repeated falls: Secondary | ICD-10-CM

## 2014-11-05 DIAGNOSIS — M25511 Pain in right shoulder: Secondary | ICD-10-CM | POA: Diagnosis not present

## 2014-11-05 NOTE — Therapy (Signed)
[place], [place], [address] Phone: [phone]   Fax:  [phone]  Physical Therapy Treatment  Patient Details  Name: CHYAN CARNERO MRN: [id number] Date of Birth: 05/14/1923 Referring Provider:  Gavin Pound, MD  Encounter Date: 11/05/2014      PT End of Session - 11/05/14 1137    Visit Number 5   Date for PT Re-Evaluation 11/19/14   PT Start Time 1100   PT Stop Time 1151   PT Time Calculation (min) 51 min   Activity Tolerance Patient tolerated treatment well   Behavior During Therapy [facility] for tasks assessed/performed      Past Medical History  Diagnosis Date  . HTN (hypertension)   . Melanoma   . CAD (coronary artery disease)     nonobstructive cath 2006  . Cholelithiases   . Osteoporosis   . COPD (chronic obstructive pulmonary disease)   . Hyperlipidemia   . Osteoarthritis   . Diastolic heart failure     Past Surgical History  Procedure Laterality Date  . Appendectomy    . Breast lumpectomy    . Tonsillectomy and adenoidectomy    . Parotidectomy      1959  . Bladder suspension      2006  . Carpal tunnel release      2007  . Melanoma excision      2008  . Lymph node biopsy    . Cataract extraction      There were no vitals filed for this visit.  Visit Diagnosis:  Right shoulder pain  Falls frequently      Subjective Assessment - 11/05/14 1108    Subjective always has some pain in shoulder; now in neck too    Patient Stated Goals no pain and no difficulty with dressing   Currently in Pain? Yes   Pain Score 4    Pain Location Shoulder   Pain Orientation Right   Pain Descriptors / Indicators Aching   Pain Type Chronic pain   Pain Onset More than a month ago   Pain Frequency Intermittent   Aggravating Factors  reaching behind and up   Pain Relieving Factors avoiding provoking positions            [facility] PT Assessment - 11/05/14 1113    AROM   Right Shoulder Flexion 135 Degrees   Right Shoulder ABduction 125 Degrees   Right Shoulder Internal Rotation 84 Degrees                     [facility] Adult PT Treatment/Exercise - 11/05/14 1109    Shoulder Exercises: Supine   Horizontal ABduction Both;20 reps;Theraband   Theraband Level (Shoulder Horizontal ABduction) Level 3 (Green)   Horizontal ABduction Limitations mod cues for technique; on towel roll   Flexion Both;20 reps;Theraband   Theraband Level (Shoulder Flexion) Level 3 (Green)   Flexion Limitations on towel roll with cues for technique   Shoulder Exercises: ROM/Strengthening   UBE (Upper Arm Bike) Level 1.5 x 6 min   Shoulder Exercises: Stretch   Other Shoulder Stretches supine on towel roll x 5 min for posture and chest stretch   Modalities   Modalities Electrical Stimulation;Moist Heat   Moist Heat Therapy   Number Minutes Moist Heat 12 Minutes   Moist Heat Location Shoulder   Electrical Stimulation   Electrical Stimulation Location Rt shoulder   Electrical Stimulation Parameters premod   Electrical Stimulation Goals Pain  PT Short Term Goals - 10/04/14 1141    PT SHORT TERM GOAL #1   Title independent with initial HEP   Status Achieved           PT Long Term Goals - 11/05/14 1139    PT LONG TERM GOAL #1   Title independent with advance HEP to include balance   Status On-going   PT LONG TERM GOAL #2   Title decrease pain in the right shoulder 50%   Status On-going   PT LONG TERM GOAL #3   Title increase IR to better dress herself to 66 degrees   Status Achieved               Plan - 11/05/14 1138    Clinical Impression Statement Pt returns after 1 month without PT.  Overall ROM R shoulder improved without significant c/o pain.  Pt very concerned about posture and requests to focus on postural exercises.  Will continue to benefit from PT to maximize function.     PT Next Visit Plan  work on balance,  functional activity and core strength and add HEP; balance exercises   Consulted and Agree with Plan of Care Patient        Problem List Patient Active Problem List   Diagnosis Date Noted  . Dyspnea 11/23/2013   Laureen Abrahams, PT, DPT 11/05/2014 11:53 AM  [place] [address] [place], [place], [address] Phone: [phone]   Fax:  [phone]

## 2014-11-07 ENCOUNTER — Ambulatory Visit: Payer: Medicare Other | Admitting: Physical Therapy

## 2014-11-07 DIAGNOSIS — R296 Repeated falls: Secondary | ICD-10-CM

## 2014-11-07 DIAGNOSIS — H53419 Scotoma involving central area, unspecified eye: Secondary | ICD-10-CM | POA: Diagnosis not present

## 2014-11-07 DIAGNOSIS — H542 Low vision, both eyes: Secondary | ICD-10-CM | POA: Diagnosis not present

## 2014-11-07 DIAGNOSIS — M25511 Pain in right shoulder: Secondary | ICD-10-CM

## 2014-11-07 NOTE — Therapy (Signed)
[place], [place], [address] Phone: [phone]   Fax:  [phone]  Physical Therapy Treatment  Patient Details  Name: Alice Hayes MRN: [id number] Date of Birth: Nov 04, 1922 Referring Provider:  Gavin Pound, MD  Encounter Date: 11/07/2014      PT End of Session - 11/07/14 1136    Visit Number 6   Date for PT Re-Evaluation 11/19/14   PT Start Time 1100   PT Stop Time 1151   PT Time Calculation (min) 51 min   Activity Tolerance Patient tolerated treatment well   Behavior During Therapy [facility] for tasks assessed/performed      Past Medical History  Diagnosis Date  . HTN (hypertension)   . Melanoma   . CAD (coronary artery disease)     nonobstructive cath 2006  . Cholelithiases   . Osteoporosis   . COPD (chronic obstructive pulmonary disease)   . Hyperlipidemia   . Osteoarthritis   . Diastolic heart failure     Past Surgical History  Procedure Laterality Date  . Appendectomy    . Breast lumpectomy    . Tonsillectomy and adenoidectomy    . Parotidectomy      1959  . Bladder suspension      2006  . Carpal tunnel release      2007  . Melanoma excision      2008  . Lymph node biopsy    . Cataract extraction      There were no vitals filed for this visit.  Visit Diagnosis:  Right shoulder pain  Falls frequently      Subjective Assessment - 11/07/14 1057    Subjective felt better after last session.  had a really good day yesterday.   Patient Stated Goals no pain and no difficulty with dressing   Currently in Pain? Yes   Pain Score 4    Pain Location Shoulder   Pain Orientation Right   Pain Type Chronic pain   Pain Onset More than a month ago   Pain Frequency Intermittent                         [facility] Adult PT Treatment/Exercise - 11/07/14 1103    Shoulder Exercises: Supine   Horizontal ABduction Both;20 reps;Theraband   Theraband Level  (Shoulder Horizontal ABduction) Level 3 (Green)   Horizontal ABduction Limitations mod cues for technique; on towel roll   Flexion Both;20 reps;Theraband   Theraband Level (Shoulder Flexion) Level 3 (Green)   Flexion Limitations on towel roll with cues for technique   Shoulder Exercises: Standing   Extension Both;15 reps;Theraband   Theraband Level (Shoulder Extension) Level 2 (Red)   Retraction Both;15 reps;Theraband   Theraband Level (Shoulder Retraction) Level 2 (Red)   Shoulder Exercises: ROM/Strengthening   UBE (Upper Arm Bike) Level 2.5 x 6 min   Cybex Row Limitations 5# 2x10   Other ROM/Strengthening Exercises lat pull down 5# 2x10   Shoulder Exercises: Stretch   Other Shoulder Stretches supine on towel roll x 5 min for posture and chest stretch   Modalities   Modalities Electrical Stimulation;Moist Heat   Moist Heat Therapy   Number Minutes Moist Heat 15 Minutes   Moist Heat Location Shoulder   Electrical Stimulation   Electrical Stimulation Location Rt shoulder   Electrical Stimulation Parameters premod   Electrical Stimulation Goals Pain  PT Short Term Goals - 10/04/14 1141    PT SHORT TERM GOAL #1   Title independent with initial HEP   Status Achieved           PT Long Term Goals - 11/05/14 1139    PT LONG TERM GOAL #1   Title independent with advance HEP to include balance   Status On-going   PT LONG TERM GOAL #2   Title decrease pain in the right shoulder 50%   Status On-going   PT LONG TERM GOAL #3   Title increase IR to better dress herself to 66 degrees   Status Achieved               Plan - 11/07/14 1136    Clinical Impression Statement Pt reports improved symptoms in shoulder despite rating pain exactly the same.  Will cont to benefit from PT to maximize function.   PT Next Visit Plan  work on balance, functional activity and core strength and add HEP; balance exercises   Consulted and Agree with Plan of Care  Patient        Problem List Patient Active Problem List   Diagnosis Date Noted  . Dyspnea 11/23/2013   Laureen Abrahams, PT, DPT 11/07/2014 11:52 AM  [place] [address] [place], [place], [address] Phone: [phone]   Fax:  [phone]

## 2014-11-14 ENCOUNTER — Ambulatory Visit: Payer: Medicare Other | Admitting: Physical Therapy

## 2014-11-14 DIAGNOSIS — M25511 Pain in right shoulder: Secondary | ICD-10-CM | POA: Diagnosis not present

## 2014-11-14 DIAGNOSIS — R296 Repeated falls: Secondary | ICD-10-CM | POA: Diagnosis not present

## 2014-11-14 NOTE — Patient Instructions (Signed)
Pectoralis Stretch: Supine (Towel Roll)   Lie with rolled towel under spine, letting shoulders relax toward floor. Lie _5__ minutes. Do _1-3__ times per day.  http://ss.exer.us/355   Copyright  VHI. All rights reserved.    Resisted Horizontal Abduction: Bilateral   Lie on back with towel roll, tubing in both hands, arms out in front. Keeping arms straight, pinch shoulder blades together and stretch arms out. Repeat __20__ times per set. Do __1__ sets per session. Do __1-3__ sessions per day.  http://orth.exer.us/969   Copyright  VHI. All rights reserved.   Strengthening: Resisted Flexion   Lie on back with towel roll.  Hold tubing in both hands at waist.  Keep one arm still and lift other arm forward and up. Move shoulder through pain-free range of motion. Repeat _20___ times per set. Do __1__ sets per session. Do __1-3__ sessions per day. Repeat with other arm.  http://orth.exer.us/825   Copyright  VHI. All rights reserved.   Resisted External Rotation: in Neutral - Bilateral   Lie on back with towel roll, tubing in both hands, elbows at sides, bent to 90, forearms forward. Pinch shoulder blades together and rotate forearms out. Keep elbows at sides. Repeat _20___ times per set. Do _1___ sets per session. Do __1-3__ sessions per day.  http://orth.exer.us/967   Copyright  VHI. All rights reserved.    [place], [place] [address]  [phone] (office) [phone] (fax)

## 2014-11-14 NOTE — Therapy (Signed)
[place], [place], [address] Phone: [phone]   Fax:  [phone]  Physical Therapy Treatment  Patient Details  Name: Alice Hayes MRN: [id number] Date of Birth: 1922-10-27 Referring Provider:  Gavin Pound, MD  Encounter Date: 11/14/2014      PT End of Session - 11/14/14 1140    Visit Number 7   Date for PT Re-Evaluation 11/19/14   PT Start Time 1100   PT Stop Time 1150   PT Time Calculation (min) 50 min   Activity Tolerance Patient tolerated treatment well   Behavior During Therapy [facility] for tasks assessed/performed      Past Medical History  Diagnosis Date  . HTN (hypertension)   . Melanoma   . CAD (coronary artery disease)     nonobstructive cath 2006  . Cholelithiases   . Osteoporosis   . COPD (chronic obstructive pulmonary disease)   . Hyperlipidemia   . Osteoarthritis   . Diastolic heart failure     Past Surgical History  Procedure Laterality Date  . Appendectomy    . Breast lumpectomy    . Tonsillectomy and adenoidectomy    . Parotidectomy      1959  . Bladder suspension      2006  . Carpal tunnel release      2007  . Melanoma excision      2008  . Lymph node biopsy    . Cataract extraction      There were no vitals filed for this visit.  Visit Diagnosis:  Right shoulder pain  Falls frequently      Subjective Assessment - 11/14/14 1103    Subjective feels good; had one episode of pain in axilla but it went away   Pertinent History frequent falls   Limitations Standing;Walking;Lifting;House hold activities   Diagnostic tests x-rays show arthritic changes   Patient Stated Goals no pain and no difficulty with dressing   Currently in Pain? Yes   Pain Score 5    Pain Location Shoulder   Pain Orientation Right   Pain Descriptors / Indicators Aching;Constant;Discomfort   Pain Type Chronic pain   Pain Onset More than a month ago   Pain Frequency  Intermittent   Aggravating Factors  reaching behind and up   Pain Relieving Factors avoiding provoking positions                         [facility] Adult PT Treatment/Exercise - 11/14/14 1105    Shoulder Exercises: Supine   Horizontal ABduction Both;20 reps;Theraband   Theraband Level (Shoulder Horizontal ABduction) Level 3 (Green)   Horizontal ABduction Limitations mod cues for technique; on towel roll   External Rotation Strengthening;Both;20 reps;Theraband   Theraband Level (Shoulder External Rotation) Level 3 (Green)   External Rotation Limitations on towel roll with min cues for technique   Flexion Both;20 reps;Theraband   Theraband Level (Shoulder Flexion) Level 3 (Green)   Flexion Limitations on towel roll with cues for technique   Shoulder Exercises: Seated   Retraction Strengthening;Both;20 reps;Theraband   Theraband Level (Shoulder Retraction) Level 3 (Green)   Shoulder Exercises: Standing   Extension Both;20 reps;Theraband   Theraband Level (Shoulder Extension) Level 3 (Green)   Retraction Both;20 reps;Theraband   Theraband Level (Shoulder Retraction) Level 3 (Green)   Shoulder Exercises: ROM/Strengthening   UBE (Upper Arm Bike) Level 1 x 6 min   Shoulder Exercises: Stretch   Other  Shoulder Stretches supine on towel roll x 5 min for posture and chest stretch   Modalities   Modalities Electrical Stimulation;Moist Heat   Moist Heat Therapy   Number Minutes Moist Heat 15 Minutes   Moist Heat Location Shoulder   Electrical Stimulation   Electrical Stimulation Location Rt shoulder   Electrical Stimulation Parameters premod x 15 min   Electrical Stimulation Goals Pain                PT Education - 11/14/14 1140    Education provided Yes   Education Details HEP   Person(s) Educated Patient   Methods Explanation;Demonstration;Handout   Comprehension Verbalized understanding;Returned demonstration;Need further instruction          PT Short Term  Goals - 10/04/14 1141    PT SHORT TERM GOAL #1   Title independent with initial HEP   Status Achieved           PT Long Term Goals - 11/05/14 1139    PT LONG TERM GOAL #1   Title independent with advance HEP to include balance   Status On-going   PT LONG TERM GOAL #2   Title decrease pain in the right shoulder 50%   Status On-going   PT LONG TERM GOAL #3   Title increase IR to better dress herself to 66 degrees   Status Achieved               Plan - 11/14/14 1141    Clinical Impression Statement Pt states she feels shoulder is improving and continues to reinforce focus on postural exercises.  Pt states she is still receiving low vision treatment and may need to "take the next 1-2 weeks off."  Will plan to reassess and complete renewal if/when pt returns.   PT Next Visit Plan will need renewal; pt wanting to focus on posture   Consulted and Agree with Plan of Care Patient        Problem List Patient Active Problem List   Diagnosis Date Noted  . Dyspnea 11/23/2013   Laureen Abrahams, PT, DPT 11/14/2014 11:51 AM  [place] [address] [employer] [address] [place], [place], [address] Phone: [phone]   Fax:  [phone]

## 2014-11-19 ENCOUNTER — Ambulatory Visit: Payer: Medicare Other | Admitting: Physical Therapy

## 2014-11-19 DIAGNOSIS — M25511 Pain in right shoulder: Secondary | ICD-10-CM | POA: Diagnosis not present

## 2014-11-19 DIAGNOSIS — R296 Repeated falls: Secondary | ICD-10-CM | POA: Diagnosis not present

## 2014-11-19 NOTE — Therapy (Addendum)
[place] [address] [place], [place], [address] Phone: [phone]   Fax:  [phone]  Physical Therapy Treatment/Recertification  Patient Details  Name: Alice Hayes MRN: [id number] Date of Birth: 1923-05-02 Referring Provider:  Gavin Pound, MD  Encounter Date: 11/19/2014      PT End of Session - 11/19/14 1013    Visit Number 8   Date for PT Re-Evaluation 12/17/14   PT Start Time 0930   PT Stop Time 1018   PT Time Calculation (min) 48 min   Activity Tolerance Patient tolerated treatment well   Behavior During Therapy [facility] for tasks assessed/performed      Past Medical History  Diagnosis Date  . HTN (hypertension)   . Melanoma   . CAD (coronary artery disease)     nonobstructive cath 2006  . Cholelithiases   . Osteoporosis   . COPD (chronic obstructive pulmonary disease)   . Hyperlipidemia   . Osteoarthritis   . Diastolic heart failure     Past Surgical History  Procedure Laterality Date  . Appendectomy    . Breast lumpectomy    . Tonsillectomy and adenoidectomy    . Parotidectomy      1959  . Bladder suspension      2006  . Carpal tunnel release      2007  . Melanoma excision      2008  . Lymph node biopsy    . Cataract extraction      There were no vitals filed for this visit.  Visit Diagnosis:  Right shoulder pain - Plan: PT plan of care cert/re-cert  Falls frequently - Plan: PT plan of care cert/re-cert      Subjective Assessment - 11/19/14 0934    Subjective tried to do some exercises over the weekend; reports she is doing better   Pertinent History frequent falls   Limitations Standing;Walking;Lifting;House hold activities   Diagnostic tests x-rays show arthritic changes   Patient Stated Goals no pain and no difficulty with dressing   Currently in Pain? Yes   Pain Score 5    Pain Location Shoulder   Pain Orientation Right   Pain Descriptors / Indicators  Aching;Constant;Discomfort   Pain Type Chronic pain   Aggravating Factors  "certain motions that I do"  unable to state which motions "I don't know it's hard to say"                         [facility] Adult PT Treatment/Exercise - 11/19/14 0937    Shoulder Exercises: Supine   Horizontal ABduction Both;20 reps;Theraband   Theraband Level (Shoulder Horizontal ABduction) Level 3 (Green)   Horizontal ABduction Limitations on towel roll   Flexion Both;20 reps;Theraband   Theraband Level (Shoulder Flexion) Level 3 (Green)   Flexion Limitations on towel roll with cues for technique   Shoulder Exercises: Standing   Retraction Both;20 reps;Theraband   Theraband Level (Shoulder Retraction) Level 3 (Green)   Shoulder Exercises: ROM/Strengthening   UBE (Upper Arm Bike) Level 1 x 6 min   Shoulder Exercises: Stretch   Other Shoulder Stretches supine on towel roll x 5 min for posture and chest stretch   Modalities   Modalities Electrical Stimulation;Moist Heat   Moist Heat Therapy   Number Minutes Moist Heat 15 Minutes   Moist Heat Location Shoulder   Electrical Stimulation   Electrical Stimulation Location Rt shoulder   Electrical Stimulation Parameters premod x  15 min   Electrical Stimulation Goals Pain                  PT Short Term Goals - 10/04/14 1141    PT SHORT TERM GOAL #1   Title independent with initial HEP   Status Achieved           PT Long Term Goals - 11/19/14 1019    PT LONG TERM GOAL #1   Title independent with advance HEP to include balance (12/17/14)   Time 4   Period Weeks   Status On-going   PT LONG TERM GOAL #2   Title decrease pain in the right shoulder 50% (12/17/14)   Time 4   Period Weeks   Status On-going   PT LONG TERM GOAL #3   Title increase IR to better dress herself to 66 degrees   Status Achieved               Plan - 11/19/14 1014    Clinical Impression Statement Pt slowly progessing towards goals and will plan  to see additional 4 weeks to focus on posture and R shoulder pain.     Pt will benefit from skilled therapeutic intervention in order to improve on the following deficits Abnormal gait;Decreased balance;Difficulty walking;Decreased range of motion;Decreased strength;Impaired flexibility;Increased muscle spasms;Impaired UE functional use;Pain   Rehab Potential Good   PT Frequency 2x / week   PT Duration 4 weeks   PT Treatment/Interventions Ultrasound;Cryotherapy;Moist Heat;Electrical Stimulation;Gait training;Therapeutic exercise;Balance training;Neuromuscular re-education;Patient/family education;Manual techniques   PT Next Visit Plan continue posture exercises   Consulted and Agree with Plan of Care Patient        Problem List Patient Active Problem List   Diagnosis Date Noted  . Dyspnea 11/23/2013   Laureen Abrahams, PT, DPT 11/19/2014 10:58 AM  [place] [address] [place], [place], [address] Phone: [phone]   Fax:  [phone]

## 2014-11-21 DIAGNOSIS — H542 Low vision, both eyes: Secondary | ICD-10-CM | POA: Diagnosis not present

## 2014-11-21 DIAGNOSIS — H53419 Scotoma involving central area, unspecified eye: Secondary | ICD-10-CM | POA: Diagnosis not present

## 2014-11-28 DIAGNOSIS — H53419 Scotoma involving central area, unspecified eye: Secondary | ICD-10-CM | POA: Diagnosis not present

## 2014-11-28 DIAGNOSIS — H542 Low vision, both eyes: Secondary | ICD-10-CM | POA: Diagnosis not present

## 2014-12-07 DIAGNOSIS — H542 Low vision, both eyes: Secondary | ICD-10-CM | POA: Diagnosis not present

## 2014-12-07 DIAGNOSIS — H53419 Scotoma involving central area, unspecified eye: Secondary | ICD-10-CM | POA: Diagnosis not present

## 2014-12-13 DIAGNOSIS — H542 Low vision, both eyes: Secondary | ICD-10-CM | POA: Diagnosis not present

## 2014-12-13 DIAGNOSIS — H53419 Scotoma involving central area, unspecified eye: Secondary | ICD-10-CM | POA: Diagnosis not present

## 2014-12-20 DIAGNOSIS — H542 Low vision, both eyes: Secondary | ICD-10-CM | POA: Diagnosis not present

## 2014-12-20 DIAGNOSIS — H53419 Scotoma involving central area, unspecified eye: Secondary | ICD-10-CM | POA: Diagnosis not present

## 2014-12-27 DIAGNOSIS — H542 Low vision, both eyes: Secondary | ICD-10-CM | POA: Diagnosis not present

## 2014-12-27 DIAGNOSIS — H53419 Scotoma involving central area, unspecified eye: Secondary | ICD-10-CM | POA: Diagnosis not present

## 2014-12-28 DIAGNOSIS — R300 Dysuria: Secondary | ICD-10-CM | POA: Diagnosis not present

## 2014-12-31 DIAGNOSIS — R300 Dysuria: Secondary | ICD-10-CM | POA: Diagnosis not present

## 2015-01-06 DIAGNOSIS — S80812A Abrasion, left lower leg, initial encounter: Secondary | ICD-10-CM | POA: Diagnosis not present

## 2015-01-10 DIAGNOSIS — I739 Peripheral vascular disease, unspecified: Secondary | ICD-10-CM | POA: Diagnosis not present

## 2015-01-10 DIAGNOSIS — L603 Nail dystrophy: Secondary | ICD-10-CM | POA: Diagnosis not present

## 2015-01-17 DIAGNOSIS — H53419 Scotoma involving central area, unspecified eye: Secondary | ICD-10-CM | POA: Diagnosis not present

## 2015-01-17 DIAGNOSIS — H542 Low vision, both eyes: Secondary | ICD-10-CM | POA: Diagnosis not present

## 2015-01-18 DIAGNOSIS — T148 Other injury of unspecified body region: Secondary | ICD-10-CM | POA: Diagnosis not present

## 2015-01-29 DIAGNOSIS — R351 Nocturia: Secondary | ICD-10-CM | POA: Diagnosis not present

## 2015-01-29 DIAGNOSIS — R350 Frequency of micturition: Secondary | ICD-10-CM | POA: Diagnosis not present

## 2015-01-29 DIAGNOSIS — R3915 Urgency of urination: Secondary | ICD-10-CM | POA: Diagnosis not present

## 2015-02-18 DIAGNOSIS — H3532 Exudative age-related macular degeneration: Secondary | ICD-10-CM | POA: Diagnosis not present

## 2015-02-18 DIAGNOSIS — H43813 Vitreous degeneration, bilateral: Secondary | ICD-10-CM | POA: Diagnosis not present

## 2015-02-25 DIAGNOSIS — K219 Gastro-esophageal reflux disease without esophagitis: Secondary | ICD-10-CM | POA: Diagnosis not present

## 2015-02-25 DIAGNOSIS — L03116 Cellulitis of left lower limb: Secondary | ICD-10-CM | POA: Diagnosis not present

## 2015-02-25 DIAGNOSIS — I10 Essential (primary) hypertension: Secondary | ICD-10-CM | POA: Diagnosis not present

## 2015-02-25 DIAGNOSIS — E559 Vitamin D deficiency, unspecified: Secondary | ICD-10-CM | POA: Diagnosis not present

## 2015-02-25 DIAGNOSIS — M810 Age-related osteoporosis without current pathological fracture: Secondary | ICD-10-CM | POA: Diagnosis not present

## 2015-02-25 DIAGNOSIS — E039 Hypothyroidism, unspecified: Secondary | ICD-10-CM | POA: Diagnosis not present

## 2015-02-25 DIAGNOSIS — M159 Polyosteoarthritis, unspecified: Secondary | ICD-10-CM | POA: Diagnosis not present

## 2015-02-25 DIAGNOSIS — G629 Polyneuropathy, unspecified: Secondary | ICD-10-CM | POA: Diagnosis not present

## 2015-02-25 DIAGNOSIS — E780 Pure hypercholesterolemia: Secondary | ICD-10-CM | POA: Diagnosis not present

## 2015-03-06 DIAGNOSIS — L570 Actinic keratosis: Secondary | ICD-10-CM | POA: Diagnosis not present

## 2015-03-06 DIAGNOSIS — Z85828 Personal history of other malignant neoplasm of skin: Secondary | ICD-10-CM | POA: Diagnosis not present

## 2015-03-06 DIAGNOSIS — D239 Other benign neoplasm of skin, unspecified: Secondary | ICD-10-CM | POA: Diagnosis not present

## 2015-03-07 DIAGNOSIS — L03116 Cellulitis of left lower limb: Secondary | ICD-10-CM | POA: Diagnosis not present

## 2015-03-12 ENCOUNTER — Emergency Department (HOSPITAL_COMMUNITY): Payer: Medicare Other

## 2015-03-12 ENCOUNTER — Encounter (HOSPITAL_COMMUNITY): Payer: Self-pay | Admitting: Emergency Medicine

## 2015-03-12 ENCOUNTER — Emergency Department (HOSPITAL_COMMUNITY)
Admission: EM | Admit: 2015-03-12 | Discharge: 2015-03-12 | Disposition: A | Discharge status: Home / Self Care | Payer: Medicare Other | Attending: Emergency Medicine | Admitting: Emergency Medicine

## 2015-03-12 DIAGNOSIS — R413 Other amnesia: Secondary | ICD-10-CM | POA: Diagnosis not present

## 2015-03-12 DIAGNOSIS — Y998 Other external cause status: Secondary | ICD-10-CM | POA: Diagnosis not present

## 2015-03-12 DIAGNOSIS — S0093XA Contusion of unspecified part of head, initial encounter: Secondary | ICD-10-CM | POA: Diagnosis not present

## 2015-03-12 DIAGNOSIS — Z85820 Personal history of malignant melanoma of skin: Secondary | ICD-10-CM | POA: Diagnosis not present

## 2015-03-12 DIAGNOSIS — W19XXXA Unspecified fall, initial encounter: Secondary | ICD-10-CM

## 2015-03-12 DIAGNOSIS — R4182 Altered mental status, unspecified: Secondary | ICD-10-CM | POA: Diagnosis present

## 2015-03-12 DIAGNOSIS — Z8719 Personal history of other diseases of the digestive system: Secondary | ICD-10-CM | POA: Diagnosis not present

## 2015-03-12 DIAGNOSIS — Z8739 Personal history of other diseases of the musculoskeletal system and connective tissue: Secondary | ICD-10-CM | POA: Diagnosis not present

## 2015-03-12 DIAGNOSIS — Z79899 Other long term (current) drug therapy: Secondary | ICD-10-CM | POA: Diagnosis not present

## 2015-03-12 DIAGNOSIS — I10 Essential (primary) hypertension: Secondary | ICD-10-CM | POA: Diagnosis not present

## 2015-03-12 DIAGNOSIS — Z23 Encounter for immunization: Secondary | ICD-10-CM | POA: Diagnosis not present

## 2015-03-12 DIAGNOSIS — I5030 Unspecified diastolic (congestive) heart failure: Secondary | ICD-10-CM | POA: Diagnosis not present

## 2015-03-12 DIAGNOSIS — Y92002 Bathroom of unspecified non-institutional (private) residence single-family (private) house as the place of occurrence of the external cause: Secondary | ICD-10-CM | POA: Diagnosis not present

## 2015-03-12 DIAGNOSIS — R410 Disorientation, unspecified: Secondary | ICD-10-CM | POA: Diagnosis not present

## 2015-03-12 DIAGNOSIS — I2510 Atherosclerotic heart disease of native coronary artery without angina pectoris: Secondary | ICD-10-CM | POA: Insufficient documentation

## 2015-03-12 DIAGNOSIS — Z043 Encounter for examination and observation following other accident: Secondary | ICD-10-CM | POA: Diagnosis not present

## 2015-03-12 DIAGNOSIS — Y9301 Activity, walking, marching and hiking: Secondary | ICD-10-CM | POA: Diagnosis not present

## 2015-03-12 DIAGNOSIS — J449 Chronic obstructive pulmonary disease, unspecified: Secondary | ICD-10-CM | POA: Diagnosis not present

## 2015-03-12 DIAGNOSIS — R296 Repeated falls: Secondary | ICD-10-CM | POA: Diagnosis not present

## 2015-03-12 DIAGNOSIS — Z8659 Personal history of other mental and behavioral disorders: Secondary | ICD-10-CM | POA: Diagnosis not present

## 2015-03-12 LAB — CBC WITH DIFFERENTIAL/PLATELET
BASOS ABS: 0.1 10*3/uL (ref 0.0–0.1)
BASOS PCT: 1 %
Eosinophils Absolute: 0.3 10*3/uL (ref 0.0–0.7)
Eosinophils Relative: 3 %
HEMATOCRIT: 38 % (ref 36.0–46.0)
HEMOGLOBIN: 12.5 g/dL (ref 12.0–15.0)
LYMPHS PCT: 19 %
Lymphs Abs: 1.8 10*3/uL (ref 0.7–4.0)
MCH: 30.2 pg (ref 26.0–34.0)
MCHC: 32.9 g/dL (ref 30.0–36.0)
MCV: 91.8 fL (ref 78.0–100.0)
MONO ABS: 0.8 10*3/uL (ref 0.1–1.0)
Monocytes Relative: 8 %
NEUTROS ABS: 6.8 10*3/uL (ref 1.7–7.7)
NEUTROS PCT: 69 %
Platelets: 200 10*3/uL (ref 150–400)
RBC: 4.14 MIL/uL (ref 3.87–5.11)
RDW: 17.6 % — ABNORMAL HIGH (ref 11.5–15.5)
WBC: 9.9 10*3/uL (ref 4.0–10.5)

## 2015-03-12 LAB — COMPREHENSIVE METABOLIC PANEL
ALBUMIN: 4.4 g/dL (ref 3.5–5.0)
ALK PHOS: 73 U/L (ref 38–126)
ALT: 21 U/L (ref 14–54)
AST: 34 U/L (ref 15–41)
Anion gap: 7 (ref 5–15)
BILIRUBIN TOTAL: 0.7 mg/dL (ref 0.3–1.2)
BUN: 21 mg/dL — AB (ref 6–20)
CALCIUM: 9.4 mg/dL (ref 8.9–10.3)
CO2: 25 mmol/L (ref 22–32)
CREATININE: 1.09 mg/dL — AB (ref 0.44–1.00)
Chloride: 105 mmol/L (ref 101–111)
GFR calc Af Amer: 51 mL/min — ABNORMAL LOW (ref >60)
GFR, EST NON AFRICAN AMERICAN: 44 mL/min — AB (ref >60)
GLUCOSE: 104 mg/dL — AB (ref 65–99)
Potassium: 4.3 mmol/L (ref 3.5–5.1)
Sodium: 137 mmol/L (ref 135–145)
TOTAL PROTEIN: 7.5 g/dL (ref 6.5–8.1)

## 2015-03-12 LAB — URINALYSIS, ROUTINE W REFLEX MICROSCOPIC
Bilirubin Urine: NEGATIVE
GLUCOSE, UA: NEGATIVE mg/dL
Hgb urine dipstick: NEGATIVE
KETONES UR: NEGATIVE mg/dL
LEUKOCYTES UA: NEGATIVE
NITRITE: NEGATIVE
PH: 6.5 (ref 5.0–8.0)
PROTEIN: NEGATIVE mg/dL
Specific Gravity, Urine: 1.008 (ref 1.005–1.030)
Urobilinogen, UA: 0.2 mg/dL (ref 0.0–1.0)

## 2015-03-12 LAB — MAGNESIUM: MAGNESIUM: 2.5 mg/dL — AB (ref 1.7–2.4)

## 2015-03-12 LAB — PHOSPHORUS: Phosphorus: 3.5 mg/dL (ref 2.5–4.6)

## 2015-03-12 NOTE — Discharge Instructions (Signed)
Return without fail for worsening symptoms, including worsening mental status changes, recurrent falls, or any other symptoms concerning to you.  Confusion Confusion is the inability to think with your usual speed or clarity. Confusion may come on quickly or slowly over time. How quickly the confusion comes on depends on the cause. Confusion can be due to any number of causes. CAUSES   Concussion, head injury, or head trauma.  Seizures.  Stroke.  Fever.  Brain tumor.  Age related decreased brain function (dementia).  Heightened emotional states like rage or terror.  Mental illness in which the person loses the ability to determine what is real and what is not (hallucinations).  Infections such as a urinary tract infection (UTI).  Toxic effects from alcohol, drugs, or prescription medicines.  Dehydration and an imbalance of salts in the body (electrolytes).  Lack of sleep.  Low blood sugar (diabetes).  Low levels of oxygen from conditions such as chronic lung disorders.  Drug interactions or other medicine side effects.  Nutritional deficiencies, especially niacin, thiamine, vitamin C, or vitamin B.  Sudden drop in body temperature (hypothermia).  Change in routine, such as when traveling or hospitalized. SIGNS AND SYMPTOMS  People often describe their thinking as cloudy or unclear when they are confused. Confusion can also include feeling disoriented. That means you are unaware of where or who you are. You may also not know what the date or time is. If confused, you may also have difficulty paying attention, remembering, and making decisions. Some people also act aggressively when they are confused.  DIAGNOSIS  The medical evaluation of confusion may include:  Blood and urine tests.  X-rays.  Brain and nervous system tests.  Analyzing your brain waves (electroencephalogram or EEG).  Magnetic resonance imaging (MRI) of your head.  Computed tomography (CT) scan  of your head.  Mental status tests in which your health care provider may ask many questions. Some of these questions may seem silly or strange, but they are a very important test to help diagnose and treat confusion. TREATMENT  An admission to the hospital may not be needed, but a person with confusion should not be left alone. Stay with a family member or friend until the confusion clears. Avoid alcohol, pain relievers, or sedative drugs until you have fully recovered. Do not drive until directed by your health care provider. HOME CARE INSTRUCTIONS  What family and friends can do:  To find out if someone is confused, ask the person to state his or her name, age, and the date. If the person is unsure or answers incorrectly, he or she is confused.  Always introduce yourself, no matter how well the person knows you.  Often remind the person of his or her location.  Place a calendar and clock near the confused person.  Help the person with his or her medicines. You may want to use a pill box, an alarm as a reminder, or give the person each dose as prescribed.  Talk about current events and plans for the day.  Try to keep the environment calm, quiet, and peaceful.  Make sure the person keeps follow-up visits with his or her health care provider. PREVENTION  Ways to prevent confusion:  Avoid alcohol.  Eat a balanced diet.  Get enough sleep.  Take medicine only as directed by your health care provider.  Do not become isolated. Spend time with other people and make plans for your days.  Keep careful watch on your blood sugar  levels if you are diabetic. SEEK IMMEDIATE MEDICAL CARE IF:   You develop severe headaches, repeated vomiting, seizures, blackouts, or slurred speech.  There is increasing confusion, weakness, numbness, restlessness, or personality changes.  You develop a loss of balance, have marked dizziness, feel uncoordinated, or fall.  You have delusions,  hallucinations, or develop severe anxiety.  Your family members think you need to be rechecked.   This information is not intended to replace advice given to you by your health care provider. Make sure you discuss any questions you have with your health care provider.   Document Released: 06/25/2004 Document Revised: 06/08/2014 Document Reviewed: 06/23/2013 Elsevier Interactive Patient Education [employer].

## 2015-03-12 NOTE — ED Notes (Addendum)
Pt states that she fell today.  She had an unwitnessed fall this morning.  Hit rt parietal area.  Called her daughter.  Daughter took her to the doctor.  Pt was able to remember the fall pt was acting her normal self so doctor did not seem concerned.  Pt went home, took a nap, woke up and did not remember any part of the day earlier and thought it was a new day (got up, made breakfast, etc).  Denies NVD, denies pain.  Now is A&O x 4.    Pt is now saying that she has fallen 4-5 times today.

## 2015-03-12 NOTE — ED Notes (Addendum)
Tech in rm doing Pt care. Unable to collect lab at this time

## 2015-03-12 NOTE — ED Provider Notes (Signed)
CSN: [id number]     Arrival date & time 03/12/15  1732 History   First MD Initiated Contact with Patient 03/12/15 1757     Chief Complaint  Patient presents with  . Fall  . Altered Mental Status     (Consider location/radiation/quality/duration/timing/severity/associated sxs/prior Treatment) HPI 81 year old female who presents with altered mental status after fall. History of hypertension, CAD, hyperlipidemia, and diastolic heart failure. Also history of peripheral vertigo, and patient reports that she woke up this morning feeling dizzy. She states that this was consistent with her episode of vertigo in the past. Upon walking into the bathroom, she lost her balance and fell forward. She try to get up and then subsequently fell backward. Visiting nurses subsequently came and found her and initially was brought to her PCPs office. She was cleared from a traumatic standpoint, and discharged home. Upon arriving at home, her daughter noticed that she was more confused. She became disoriented in time, thinking that her accident had happened the day prior. It was recommended by her PCP that she came to the ED for evaluation. Patient does report that she has been noticing some dysuria and urinary frequency. Denies headaches, vision changes, speech changes, new numbness or weakness, vomiting, nausea, chest pain, shortness of breath, syncope or loss of consciousness, cold or flulike symptoms. Her daughter says that she has since ambulated with a baseline steadiness, and her vertigo has resolved. Past Medical History  Diagnosis Date  . HTN (hypertension)   . Melanoma ([place])   . CAD (coronary artery disease)     nonobstructive cath 2006  . Cholelithiases   . Osteoporosis   . COPD (chronic obstructive pulmonary disease) ([facility])   . Hyperlipidemia   . Osteoarthritis   . Diastolic heart failure [facility])    Past Surgical History  Procedure Laterality Date  . Appendectomy    . Breast lumpectomy    .  Tonsillectomy and adenoidectomy    . Parotidectomy      1959  . Bladder suspension      2006  . Carpal tunnel release      2007  . Melanoma excision      2008  . Lymph node biopsy    . Cataract extraction     Family History  Problem Relation Age of Onset  . CAD Father     Sudden death age 92  . CAD Son 75    MI.  Alive in his 11s  . Cancer Daughter     Breast  . Pancreatitis Daughter     Severe episode   Social History  Substance Use Topics  . Smoking status: Never Smoker   . Smokeless tobacco: None  . Alcohol Use: None   OB History    No data available     Review of Systems 10/14 systems reviewed and are negative other than those stated in the HPI    Allergies  Voltaren ophthalmic  Home Medications   Prior to Admission medications   Medication Sig Start Date End Date Taking? Authorizing Provider  AMINO ACIDS COMPLEX PO Take 1 tablet by mouth daily.    Yes Historical Provider, MD  amLODipine (NORVASC) 5 MG tablet Take 5 mg by mouth daily.   Yes Historical Provider, MD  gabapentin (NEURONTIN) 600 MG tablet Take 600 mg by mouth 2 (two) times daily.   Yes Historical Provider, MD  loratadine (CLARITIN) 10 MG tablet Take 10 mg by mouth daily.   Yes Historical Provider, MD  meclizine Johnathan Hausen)  25 MG tablet Take 25 mg by mouth 3 (three) times daily as needed for dizziness.   Yes Historical Provider, MD  Multiple Vitamin (MULTIVITAMIN WITH MINERALS) TABS tablet Take 1 tablet by mouth daily.   Yes Historical Provider, MD  Multiple Vitamins-Minerals (ICAPS) CAPS Take 1 capsule by mouth daily.    Yes Historical Provider, MD  omega-3 acid ethyl esters (LOVAZA) 1 G capsule Take 1 g by mouth daily.    Yes Historical Provider, MD  oxybutynin (DITROPAN) 5 MG tablet Take 5 mg by mouth daily.  02/28/15  Yes Historical Provider, MD  oxybutynin (DITROPAN-XL) 5 MG 24 hr tablet Take 5 mg by mouth at bedtime.   Yes Historical Provider, MD  thyroid (ARMOUR THYROID) 60 MG tablet Take 90  mg by mouth daily before breakfast.    Yes Historical Provider, MD  hydrochlorothiazide (HYDRODIURIL) 25 MG tablet Take 25 mg by mouth daily.    Historical Provider, MD   BP 109/54 mmHg  Pulse 64  Temp(Src) 98 F (36.7 C) (Oral)  Resp 20  SpO2 97% Physical Exam Physical Exam  Nursing note and vitals reviewed. Constitutional: Elderly woman, well developed, well nourished, non-toxic, and in no acute distress Head: Normocephalic and atraumatic.  Mouth/Throat: Oropharynx is clear and moist.  Neck: Normal range of motion. Neck supple. No cervical spine tenderness. Cardiovascular: Normal rate and regular rhythm.  No edema. +2 radial pulses bilaterally Pulmonary/Chest: Effort normal and breath sounds normal. No chest wall tenderness. Abdominal: Soft. There is no tenderness. There is no rebound and no guarding. No distension. Musculoskeletal: Normal range of motion. No deformities. Skin: Skin is warm and dry.  Psychiatric: Cooperative Neurological:  Alert, oriented to person, place, and situation. No fully oriented to time or timing of situation. Memory grossly in tact. Fluent speech. No dysarthria or aphasia.  Cranial nerves: VF are full. Fundoscopic exam-unable to get good visualization of the discs. Pupils are symmetric, and reactive to light. EOMI without nystagmus. No gaze deviation. Facial muscles symmetric with activation. Sensation to light touch over face in tact bilaterally. Hearing grossly in tact. Palate elevates symmetrically. Head turn and shoulder shrug are intact. Tongue midline.  Muscle bulk and tone normal. No pronator drift. Moves all extremities symmetrically. Sensation to light touch is in tact throughout in bilateral upper and lower extremities. No dysmetria with finger to nose.   ED Course  Procedures (including critical care time) Labs Review Labs Reviewed  CBC WITH DIFFERENTIAL/PLATELET - Abnormal; Notable for the following:    RDW 17.6 (*)    All other components  within normal limits  COMPREHENSIVE METABOLIC PANEL - Abnormal; Notable for the following:    Glucose, Bld 104 (*)    BUN 21 (*)    Creatinine, Ser 1.09 (*)    GFR calc non Af Amer 44 (*)    GFR calc Af Amer 51 (*)    All other components within normal limits  MAGNESIUM - Abnormal; Notable for the following:    Magnesium 2.5 (*)    All other components within normal limits  URINALYSIS, ROUTINE W REFLEX MICROSCOPIC (NOT AT [facility])  PHOSPHORUS    Imaging Review Ct Head Wo Contrast  03/12/2015  CLINICAL DATA:  81 year old female with unwitnessed fall. Subsequent memory loss. Initial encounter. EXAM: CT HEAD WITHOUT CONTRAST CT CERVICAL SPINE WITHOUT CONTRAST TECHNIQUE: Multidetector CT imaging of the head and cervical spine was performed following the standard protocol without intravenous contrast. Multiplanar CT image reconstructions of the cervical spine were also  generated. COMPARISON:  Cervical spine radiographs 06/07/2014. FINDINGS: CT HEAD FINDINGS Visualized paranasal sinuses and mastoids are clear. No scalp hematoma. Negative orbits soft tissues. Calvarium hyperostosis. No calvarium fracture identified. Calcified atherosclerosis at the skull base. Age congruent cerebral volume loss with ex vacuo ventricle prominence. Patchy and confluent cerebral white matter hypodensity. No midline shift, mass effect, or evidence of intracranial mass lesion. No acute intracranial hemorrhage identified. No cortically based acute infarct identified. No suspicious intracranial vascular hyperdensity. CT CERVICAL SPINE FINDINGS Osteopenia. Straightening of cervical lordosis. Multilevel spondylolisthesis, most pronounced at C6-C7 and C7-T1 where anterolisthesis measures 2-3 mm. Associated facet hypertrophy at both levels. Visualized skull base is intact. No atlanto-occipital dissociation. Bilateral posterior element alignment is within normal limits. There is C2-C3 posterior element ankylosis on the left. Multilevel  advanced cervical disc space loss with endplate degeneration maximal at C5-C6. No cervical spine fracture identified. Grossly intact visualized upper thoracic levels. Negative lung apices. Negative noncontrast paraspinal soft tissues, tortuous great vessels and retropharyngeal course of the carotid arteries. IMPRESSION: 1. No acute intracranial abnormality. No acute traumatic injury identified. Cerebral white matter disease most commonly due to chronic small vessel ischemia. 2. No acute fracture or listhesis identified in the cervical spine. Ligamentous injury is not excluded. Multilevel cervical spine degeneration with spondylolisthesis. Electronically Signed   By: Genevie Ann M.D.   On: 03/12/2015 19:25   Ct Cervical Spine Wo Contrast  03/12/2015  CLINICAL DATA:  81 year old female with unwitnessed fall. Subsequent memory loss. Initial encounter. EXAM: CT HEAD WITHOUT CONTRAST CT CERVICAL SPINE WITHOUT CONTRAST TECHNIQUE: Multidetector CT imaging of the head and cervical spine was performed following the standard protocol without intravenous contrast. Multiplanar CT image reconstructions of the cervical spine were also generated. COMPARISON:  Cervical spine radiographs 06/07/2014. FINDINGS: CT HEAD FINDINGS Visualized paranasal sinuses and mastoids are clear. No scalp hematoma. Negative orbits soft tissues. Calvarium hyperostosis. No calvarium fracture identified. Calcified atherosclerosis at the skull base. Age congruent cerebral volume loss with ex vacuo ventricle prominence. Patchy and confluent cerebral white matter hypodensity. No midline shift, mass effect, or evidence of intracranial mass lesion. No acute intracranial hemorrhage identified. No cortically based acute infarct identified. No suspicious intracranial vascular hyperdensity. CT CERVICAL SPINE FINDINGS Osteopenia. Straightening of cervical lordosis. Multilevel spondylolisthesis, most pronounced at C6-C7 and C7-T1 where anterolisthesis measures 2-3  mm. Associated facet hypertrophy at both levels. Visualized skull base is intact. No atlanto-occipital dissociation. Bilateral posterior element alignment is within normal limits. There is C2-C3 posterior element ankylosis on the left. Multilevel advanced cervical disc space loss with endplate degeneration maximal at C5-C6. No cervical spine fracture identified. Grossly intact visualized upper thoracic levels. Negative lung apices. Negative noncontrast paraspinal soft tissues, tortuous great vessels and retropharyngeal course of the carotid arteries. IMPRESSION: 1. No acute intracranial abnormality. No acute traumatic injury identified. Cerebral white matter disease most commonly due to chronic small vessel ischemia. 2. No acute fracture or listhesis identified in the cervical spine. Ligamentous injury is not excluded. Multilevel cervical spine degeneration with spondylolisthesis. Electronically Signed   By: Genevie Ann M.D.   On: 03/12/2015 19:25   I have personally reviewed and evaluated these images and lab results as part of my medical decision-making.   EKG Interpretation   Date/Time:  Tuesday March 12 2015 18:49:37 EDT Ventricular Rate:  77 PR Interval:  151 QRS Duration: 86 QT Interval:  363 QTC Calculation: 411 R Axis:     Text Interpretation:  Sinus rhythm Low voltage, precordial leads T wave  flattening in the anterior leads Confirmed by Emauri Krygier MD, Kamarri Lovvorn [phone]) on  03/12/2015 7:05:30 PM      MDM   Final diagnoses:  Fall, initial encounter  Confusion    81 year old female who had mechanical fall in setting of vertigo who was brought in for disorientation. Vertiginous symptoms now fully resolved and was ambulating at baseline per family. VS stable. She is well appearing. Disoriented to timing of events but remainder of neuro exam is in tact. No electrolyte or metabolic derangements on blood work. No evidence of UTI. CT head and cervical spine negative for acute traumatic injuries.  Unclear of etiology of disorientation at this time. No other symptoms suggestive of CVA and felt unlikely. Daughter states time disorientation clearing during ED course and she is otherwise at her baseline. She preferred to take patient home to observe. She will follow-up closely with PCP. Discussed strict return instructions with patient's daughter and patient. They expressed understanding of all discharge instructions and felt comfortable with the plan of care.     Forde Dandy, MD 03/13/15 1249

## 2015-03-12 NOTE — ED Notes (Signed)
Off floor for testing 

## 2015-03-18 DIAGNOSIS — R42 Dizziness and giddiness: Secondary | ICD-10-CM | POA: Diagnosis not present

## 2015-03-18 DIAGNOSIS — I959 Hypotension, unspecified: Secondary | ICD-10-CM | POA: Diagnosis not present

## 2015-03-18 DIAGNOSIS — R296 Repeated falls: Secondary | ICD-10-CM | POA: Diagnosis not present

## 2015-03-18 DIAGNOSIS — E039 Hypothyroidism, unspecified: Secondary | ICD-10-CM | POA: Diagnosis not present

## 2015-03-28 DIAGNOSIS — L84 Corns and callosities: Secondary | ICD-10-CM | POA: Diagnosis not present

## 2015-03-28 DIAGNOSIS — I739 Peripheral vascular disease, unspecified: Secondary | ICD-10-CM | POA: Diagnosis not present

## 2015-03-28 DIAGNOSIS — L603 Nail dystrophy: Secondary | ICD-10-CM | POA: Diagnosis not present

## 2015-04-03 DIAGNOSIS — L989 Disorder of the skin and subcutaneous tissue, unspecified: Secondary | ICD-10-CM | POA: Diagnosis not present

## 2015-04-03 DIAGNOSIS — E039 Hypothyroidism, unspecified: Secondary | ICD-10-CM | POA: Diagnosis not present

## 2015-04-03 DIAGNOSIS — R42 Dizziness and giddiness: Secondary | ICD-10-CM | POA: Diagnosis not present

## 2015-04-03 DIAGNOSIS — I959 Hypotension, unspecified: Secondary | ICD-10-CM | POA: Diagnosis not present

## 2015-04-03 DIAGNOSIS — R296 Repeated falls: Secondary | ICD-10-CM | POA: Diagnosis not present

## 2015-04-06 ENCOUNTER — Encounter (HOSPITAL_COMMUNITY): Payer: Self-pay

## 2015-04-06 ENCOUNTER — Emergency Department (HOSPITAL_COMMUNITY): Payer: Medicare Other

## 2015-04-06 ENCOUNTER — Emergency Department (HOSPITAL_COMMUNITY)
Admission: EM | Admit: 2015-04-06 | Discharge: 2015-04-06 | Disposition: A | Discharge status: Home / Self Care | Payer: Medicare Other | Attending: Emergency Medicine | Admitting: Emergency Medicine

## 2015-04-06 DIAGNOSIS — Z85820 Personal history of malignant melanoma of skin: Secondary | ICD-10-CM | POA: Diagnosis not present

## 2015-04-06 DIAGNOSIS — I2510 Atherosclerotic heart disease of native coronary artery without angina pectoris: Secondary | ICD-10-CM | POA: Insufficient documentation

## 2015-04-06 DIAGNOSIS — Z8739 Personal history of other diseases of the musculoskeletal system and connective tissue: Secondary | ICD-10-CM | POA: Insufficient documentation

## 2015-04-06 DIAGNOSIS — Z8639 Personal history of other endocrine, nutritional and metabolic disease: Secondary | ICD-10-CM | POA: Diagnosis not present

## 2015-04-06 DIAGNOSIS — I5030 Unspecified diastolic (congestive) heart failure: Secondary | ICD-10-CM | POA: Diagnosis not present

## 2015-04-06 DIAGNOSIS — Z8719 Personal history of other diseases of the digestive system: Secondary | ICD-10-CM | POA: Diagnosis not present

## 2015-04-06 DIAGNOSIS — Z79899 Other long term (current) drug therapy: Secondary | ICD-10-CM | POA: Diagnosis not present

## 2015-04-06 DIAGNOSIS — J449 Chronic obstructive pulmonary disease, unspecified: Secondary | ICD-10-CM | POA: Insufficient documentation

## 2015-04-06 DIAGNOSIS — R4182 Altered mental status, unspecified: Secondary | ICD-10-CM | POA: Diagnosis not present

## 2015-04-06 DIAGNOSIS — I679 Cerebrovascular disease, unspecified: Secondary | ICD-10-CM | POA: Diagnosis not present

## 2015-04-06 DIAGNOSIS — R410 Disorientation, unspecified: Secondary | ICD-10-CM | POA: Diagnosis not present

## 2015-04-06 DIAGNOSIS — I10 Essential (primary) hypertension: Secondary | ICD-10-CM | POA: Insufficient documentation

## 2015-04-06 LAB — URINE MICROSCOPIC-ADD ON

## 2015-04-06 LAB — COMPREHENSIVE METABOLIC PANEL
ALT: 18 U/L (ref 14–54)
AST: 28 U/L (ref 15–41)
Albumin: 4 g/dL (ref 3.5–5.0)
Alkaline Phosphatase: 67 U/L (ref 38–126)
Anion gap: 10 (ref 5–15)
BUN: 13 mg/dL (ref 6–20)
CO2: 28 mmol/L (ref 22–32)
Calcium: 9.5 mg/dL (ref 8.9–10.3)
Chloride: 102 mmol/L (ref 101–111)
Creatinine, Ser: 0.74 mg/dL (ref 0.44–1.00)
GFR calc Af Amer: >60 mL/min (ref >60)
GFR calc non Af Amer: >60 mL/min (ref >60)
Glucose, Bld: 134 mg/dL — ABNORMAL HIGH (ref 65–99)
Potassium: 3.4 mmol/L — ABNORMAL LOW (ref 3.5–5.1)
Sodium: 140 mmol/L (ref 135–145)
Total Bilirubin: 1.3 mg/dL — ABNORMAL HIGH (ref 0.3–1.2)
Total Protein: 7.2 g/dL (ref 6.5–8.1)

## 2015-04-06 LAB — CBC WITH DIFFERENTIAL/PLATELET
Basophils Absolute: 0 10*3/uL (ref 0.0–0.1)
Basophils Relative: 0 %
Eosinophils Absolute: 0.1 10*3/uL (ref 0.0–0.7)
Eosinophils Relative: 1 %
HCT: 41.3 % (ref 36.0–46.0)
Hemoglobin: 13.7 g/dL (ref 12.0–15.0)
Lymphocytes Relative: 23 %
Lymphs Abs: 2.2 10*3/uL (ref 0.7–4.0)
MCH: 30.6 pg (ref 26.0–34.0)
MCHC: 33.2 g/dL (ref 30.0–36.0)
MCV: 92.2 fL (ref 78.0–100.0)
Monocytes Absolute: 0.7 10*3/uL (ref 0.1–1.0)
Monocytes Relative: 8 %
Neutro Abs: 6.7 10*3/uL (ref 1.7–7.7)
Neutrophils Relative %: 68 %
Platelets: 227 10*3/uL (ref 150–400)
RBC: 4.48 MIL/uL (ref 3.87–5.11)
RDW: 17.4 % — ABNORMAL HIGH (ref 11.5–15.5)
WBC: 9.7 10*3/uL (ref 4.0–10.5)

## 2015-04-06 LAB — URINALYSIS, ROUTINE W REFLEX MICROSCOPIC
Bilirubin Urine: NEGATIVE
Glucose, UA: NEGATIVE mg/dL
Ketones, ur: NEGATIVE mg/dL
Nitrite: NEGATIVE
Protein, ur: NEGATIVE mg/dL
Specific Gravity, Urine: 1.006 (ref 1.005–1.030)
Urobilinogen, UA: 0.2 mg/dL (ref 0.0–1.0)
pH: 6 (ref 5.0–8.0)

## 2015-04-06 LAB — I-STAT CG4 LACTIC ACID, ED: Lactic Acid, Venous: 1.23 mmol/L (ref 0.5–2.0)

## 2015-04-06 MED ORDER — SODIUM CHLORIDE 0.9 % IV BOLUS (SEPSIS)
500.0000 mL | Freq: Once | INTRAVENOUS | Status: AC
  Administered 2015-04-06: 500 mL via INTRAVENOUS

## 2015-04-06 NOTE — ED Provider Notes (Signed)
CSN: [id number]     Arrival date & time 04/06/15  1536 History   First MD Initiated Contact with Patient 04/06/15 1634     Chief Complaint  Patient presents with  . Altered Mental Status     (Consider location/radiation/quality/duration/timing/severity/associated sxs/prior Treatment) HPI Patient presents to the emergency department with some intermittent altered mental status over the last few weeks that is Worse over the last 2 days.  Percent in by her primary doctor for evaluation to rule out stroke and other abnormalities in because when her confusion.  Patient has intermittent confusion about where she is and events that are going on.  Patient states she does not have any chest pain, shortness of breath, weakness, numbness, dizziness, headache, blurred vision, back pain, neck pain, fever, cough, runny nose, sore throat, rash, right headedness, anorexia, or syncope.  The patient states that nothing seemed to make her condition better or worse Past Medical History  Diagnosis Date  . HTN (hypertension)   . Melanoma ([place])   . CAD (coronary artery disease)     nonobstructive cath 2006  . Cholelithiases   . Osteoporosis   . COPD (chronic obstructive pulmonary disease) ([place])   . Hyperlipidemia   . Osteoarthritis   . Diastolic heart failure [facility])    Past Surgical History  Procedure Laterality Date  . Appendectomy    . Breast lumpectomy    . Tonsillectomy and adenoidectomy    . Parotidectomy      1959  . Bladder suspension      2006  . Carpal tunnel release      2007  . Melanoma excision      2008  . Lymph node biopsy    . Cataract extraction     Family History  Problem Relation Age of Onset  . CAD Father     Sudden death age 40  . CAD Son 52    MI.  Alive in his 57s  . Cancer Daughter     Breast  . Pancreatitis Daughter     Severe episode   Social History  Substance Use Topics  . Smoking status: Never Smoker   . Smokeless tobacco: None  . Alcohol Use: None   OB  History    No data available     Review of Systems  All other systems negative except as documented in the HPI. All pertinent positives and negatives as reviewed in the HPI.  Allergies  Voltaren ophthalmic  Home Medications   Prior to Admission medications   Medication Sig Start Date End Date Taking? Authorizing Provider  gabapentin (NEURONTIN) 600 MG tablet Take 600 mg by mouth 2 (two) times daily.   Yes Historical Provider, MD  loratadine (CLARITIN) 10 MG tablet Take 10 mg by mouth daily.   Yes Historical Provider, MD  meclizine (ANTIVERT) 25 MG tablet Take 25 mg by mouth 3 (three) times daily as needed for dizziness.   Yes Historical Provider, MD  Multiple Vitamin (MULTIVITAMIN WITH MINERALS) TABS tablet Take 1 tablet by mouth daily.   Yes Historical Provider, MD  Multiple Vitamins-Minerals (ICAPS) CAPS Take 1 capsule by mouth daily.    Yes Historical Provider, MD  omega-3 acid ethyl esters (LOVAZA) 1 G capsule Take 1 g by mouth daily.    Yes Historical Provider, MD  oxybutynin (DITROPAN) 5 MG tablet Take 5 mg by mouth daily.  02/28/15  Yes Historical Provider, MD  thyroid (ARMOUR THYROID) 60 MG tablet Take 90 mg by mouth daily before  breakfast.    Yes Historical Provider, MD   BP 156/79 mmHg  Pulse 60  Temp(Src) 98.2 F (36.8 C) (Oral)  Resp 16  SpO2 92% Physical Exam  Constitutional: She appears well-developed and well-nourished. No distress.  HENT:  Head: Normocephalic and atraumatic.  Mouth/Throat: Oropharynx is clear and moist.  Eyes: Pupils are equal, round, and reactive to light.  Neck: Normal range of motion. Neck supple.  Cardiovascular: Normal rate, regular rhythm and normal heart sounds.  Exam reveals no gallop and no friction rub.   No murmur heard. Pulmonary/Chest: Effort normal and breath sounds normal. No respiratory distress. She has no wheezes.  Abdominal: Soft. Bowel sounds are normal. She exhibits no distension. There is no tenderness.  Neurological: She  is alert. She exhibits normal muscle tone. Coordination normal.  Patient is oriented to person, place, but cannot tell me the day of the week, the month or the year  Skin: Skin is warm and dry. No rash noted. No erythema.  Psychiatric: She has a normal mood and affect. Her behavior is normal.  Nursing note and vitals reviewed.   ED Course  Procedures (including critical care time) Labs Review Labs Reviewed  COMPREHENSIVE METABOLIC PANEL - Abnormal; Notable for the following:    Potassium 3.4 (*)    Glucose, Bld 134 (*)    Total Bilirubin 1.3 (*)    All other components within normal limits  CBC WITH DIFFERENTIAL/PLATELET - Abnormal; Notable for the following:    RDW 17.4 (*)    All other components within normal limits  URINALYSIS, ROUTINE W REFLEX MICROSCOPIC (NOT AT [facility]) - Abnormal; Notable for the following:    Hgb urine dipstick SMALL (*)    Leukocytes, UA SMALL (*)    All other components within normal limits  URINE CULTURE  URINE MICROSCOPIC-ADD ON  I-STAT CG4 LACTIC ACID, ED    Imaging Review Dg Chest 2 View  04/06/2015  CLINICAL DATA:  AMS. Her daughter states pt. has been "confused a lot recently". She saw her pcp, who performed testing and "ruled out stroke". Daughter also stated pt has had several falls in the last 3 weeks since MD changed BP meds. EXAM: CHEST  2 VIEW COMPARISON:  05/31/2013 FINDINGS: Moderate enlargement of the cardiopericardial silhouette, mildly increased in size when compared to the prior chest radiographs. No mediastinal or hilar masses or evidence of adenopathy. Lungs are hyperexpanded. Lungs are clear. No pleural effusion or pneumothorax. Stable area of sclerosis in the left humeral head consistent with a healed enchondroma or old area of infarction. Bony thorax is diffusely demineralized. There is a stable compression deformity at the thoracolumbar junction. IMPRESSION: 1. No acute cardiopulmonary disease. 2. Moderate enlargement of the  cardiopericardial silhouette mildly increased in size from the prior study. Electronically Signed   By: Lajean Manes M.D.   On: 04/06/2015 19:04   Ct Head Wo Contrast  04/06/2015  CLINICAL DATA:  Confused, altered mental status EXAM: CT HEAD WITHOUT CONTRAST TECHNIQUE: Contiguous axial images were obtained from the base of the skull through the vertex without intravenous contrast. COMPARISON:  03/12/2015 FINDINGS: There is no evidence of mass effect, midline shift, or extra-axial fluid collections. There is no evidence of a space-occupying lesion or intracranial hemorrhage. There is no evidence of a cortical-based area of acute infarction. There is generalized cerebral atrophy. There is periventricular white matter low attenuation likely secondary to microangiopathy. The ventricles and sulci are appropriate for the patient's age. The basal cisterns are patent.  Visualized portions of the orbits are unremarkable. The visualized portions of the paranasal sinuses and mastoid air cells are unremarkable. Cerebrovascular atherosclerotic calcifications are noted. The osseous structures are unremarkable. IMPRESSION: 1. No acute intracranial pathology. 2. Chronic microvascular disease and cerebral atrophy. Electronically Signed   By: Kathreen Devoid   On: 04/06/2015 18:19   Mr Brain Wo Contrast (neuro Protocol)  04/06/2015  CLINICAL DATA:  81 year old female with altered mental status, confusion, fall. Initial encounter. EXAM: MRI HEAD WITHOUT CONTRAST TECHNIQUE: Multiplanar, multiecho pulse sequences of the brain and surrounding structures were obtained without intravenous contrast. COMPARISON:  Head CT without contrast 04/06/2015. FINDINGS: No restricted diffusion to suggest acute infarction. No midline shift, mass effect, evidence of mass lesion, ventriculomegaly, extra-axial collection or acute intracranial hemorrhage. Cervicomedullary junction and pituitary are within normal limits. Major intracranial vascular flow  voids are preserved. Confluent cerebral white matter T2 and FLAIR hyperintensity. Involvement of the deep white matter capsules bilaterally. No cortical encephalomalacia. Solitary chronic micro hemorrhage in the left parietal lobe (series 8, image 15). T2 heterogeneity in the deep gray matter appears mostly due to perivascular spaces. Patchy T2 hyperintensity in the pons. Cerebellum within normal limits for age. Visible internal auditory structures appear normal. Negative paranasal sinuses and mastoids. Postoperative changes to the globes. Negative orbit and scalp soft tissues. Visualized bone marrow signal is within normal limits. Negative visualized cervical spine. IMPRESSION: 1.  No acute intracranial abnormality. 2. Signal changes in the brain most compatible with chronic small vessel disease. Electronically Signed   By: Genevie Ann M.D.   On: 04/06/2015 21:40   I have personally reviewed and evaluated these images and lab results as part of my medical decision-making.    There is somewhat of a chronic component to this.  I did have her speak with neurology who felt that this could be further evaluated as an outpatient.  It seems atypical, as it is waxing and waning symptoms for TIA, but still felt that an outpatient workup would be advisable.  Patient and family are given the plan and all questions were answered  Dalia Heading, PA-C 04/08/15 0230  Leo Grosser, MD 04/12/15 [phone]

## 2015-04-06 NOTE — ED Notes (Signed)
Her daughter states pt. Has been "confused a lot recently".  She saw her pcp, who performed testing and "ruled out stroke".

## 2015-04-06 NOTE — ED Notes (Signed)
Patient transported to X-ray 

## 2015-04-06 NOTE — Discharge Instructions (Signed)
I spoke with the neurologist and they felt like follow-up with her primary care doctor would be sufficient.  Return here as needed.  Follow-up with her primary care doctor

## 2015-04-06 NOTE — ED Notes (Signed)
Nurse in rm collecting labs

## 2015-04-06 NOTE — ED Notes (Signed)
Pt transported to MRI via stretcher with tech

## 2015-04-08 LAB — URINE CULTURE: Culture: 9000

## 2015-04-15 DIAGNOSIS — I2510 Atherosclerotic heart disease of native coronary artery without angina pectoris: Secondary | ICD-10-CM | POA: Diagnosis not present

## 2015-04-15 DIAGNOSIS — R410 Disorientation, unspecified: Secondary | ICD-10-CM | POA: Diagnosis not present

## 2015-04-15 DIAGNOSIS — F419 Anxiety disorder, unspecified: Secondary | ICD-10-CM | POA: Diagnosis not present

## 2015-04-15 DIAGNOSIS — R3915 Urgency of urination: Secondary | ICD-10-CM | POA: Diagnosis not present

## 2015-04-15 DIAGNOSIS — G629 Polyneuropathy, unspecified: Secondary | ICD-10-CM | POA: Diagnosis not present

## 2015-04-15 DIAGNOSIS — I10 Essential (primary) hypertension: Secondary | ICD-10-CM | POA: Diagnosis not present

## 2015-04-15 DIAGNOSIS — E559 Vitamin D deficiency, unspecified: Secondary | ICD-10-CM | POA: Diagnosis not present

## 2015-04-15 DIAGNOSIS — E039 Hypothyroidism, unspecified: Secondary | ICD-10-CM | POA: Diagnosis not present

## 2015-04-15 DIAGNOSIS — N3941 Urge incontinence: Secondary | ICD-10-CM | POA: Diagnosis not present

## 2015-04-15 DIAGNOSIS — E876 Hypokalemia: Secondary | ICD-10-CM | POA: Diagnosis not present

## 2015-04-30 ENCOUNTER — Other Ambulatory Visit: Payer: Self-pay | Admitting: Dermatology

## 2015-04-30 DIAGNOSIS — C44722 Squamous cell carcinoma of skin of right lower limb, including hip: Secondary | ICD-10-CM | POA: Diagnosis not present

## 2015-04-30 DIAGNOSIS — I872 Venous insufficiency (chronic) (peripheral): Secondary | ICD-10-CM | POA: Diagnosis not present

## 2015-05-02 DIAGNOSIS — H353222 Exudative age-related macular degeneration, left eye, with inactive choroidal neovascularization: Secondary | ICD-10-CM | POA: Diagnosis not present

## 2015-05-02 DIAGNOSIS — H43813 Vitreous degeneration, bilateral: Secondary | ICD-10-CM | POA: Diagnosis not present

## 2015-05-20 DIAGNOSIS — M40209 Unspecified kyphosis, site unspecified: Secondary | ICD-10-CM | POA: Diagnosis not present

## 2015-05-20 DIAGNOSIS — R5381 Other malaise: Secondary | ICD-10-CM | POA: Diagnosis not present

## 2015-05-20 DIAGNOSIS — R634 Abnormal weight loss: Secondary | ICD-10-CM | POA: Diagnosis not present

## 2015-05-20 DIAGNOSIS — G629 Polyneuropathy, unspecified: Secondary | ICD-10-CM | POA: Diagnosis not present

## 2015-05-30 ENCOUNTER — Encounter: Payer: Self-pay | Admitting: Physical Therapy

## 2015-05-30 ENCOUNTER — Ambulatory Visit: Payer: Medicare Other | Attending: Family Medicine | Admitting: Physical Therapy

## 2015-05-30 DIAGNOSIS — R296 Repeated falls: Secondary | ICD-10-CM | POA: Diagnosis not present

## 2015-05-30 DIAGNOSIS — R5381 Other malaise: Secondary | ICD-10-CM | POA: Diagnosis not present

## 2015-05-30 DIAGNOSIS — R262 Difficulty in walking, not elsewhere classified: Secondary | ICD-10-CM | POA: Diagnosis not present

## 2015-05-30 DIAGNOSIS — M542 Cervicalgia: Secondary | ICD-10-CM | POA: Diagnosis not present

## 2015-05-30 NOTE — Therapy (Signed)
[address] [place], [place], [address] Phone: [phone]   Fax:  [phone]  Physical Therapy Evaluation  Patient Details  Name: LOUEEN ELM MRN: [id number] Date of Birth: 09-22-1922 Referring Provider: Yaakov Guthrie  Encounter Date: 05/30/2015      PT End of Session - 05/30/15 1143    Visit Number 1   Date for PT Re-Evaluation 07/30/15   PT Start Time 1115   PT Stop Time 1200   PT Time Calculation (min) 45 min   Activity Tolerance Patient tolerated treatment well   Behavior During Therapy [facility] for tasks assessed/performed      Past Medical History  Diagnosis Date  . HTN (hypertension)   . Melanoma ([place])   . CAD (coronary artery disease)     nonobstructive cath 2006  . Cholelithiases   . Osteoporosis   . COPD (chronic obstructive pulmonary disease) ([place])   . Hyperlipidemia   . Osteoarthritis   . Diastolic heart failure [facility])     Past Surgical History  Procedure Laterality Date  . Appendectomy    . Breast lumpectomy    . Tonsillectomy and adenoidectomy    . Parotidectomy      1959  . Bladder suspension      2006  . Carpal tunnel release      2007  . Melanoma excision      2008  . Lymph node biopsy    . Cataract extraction      There were no vitals filed for this visit.  Visit Diagnosis:  Neck pain - Plan: PT plan of care cert/re-cert  Difficulty walking - Plan: PT plan of care cert/re-cert  Debility - Plan: PT plan of care cert/re-cert  Falls frequently - Plan: PT plan of care cert/re-cert      Subjective Assessment - 05/30/15 1117    Subjective Patient hsa been having some neck pain and postureal issues for a number of years.  She continues with these, has diagnosis of physical deconditioning from the MD and reports that she has had a bout 4 falls in the past 6 months, she reports that her blood pressure was low.   Patient Stated Goals have less pain and be able  to hold my posture better   Currently in Pain? Yes   Pain Score 7    Pain Location Neck   Pain Orientation Mid;Upper   Pain Descriptors / Indicators Aching;Tightness;Dull   Pain Type Chronic pain   Pain Radiating Towards some pain into the right shoulder   Pain Onset More than a month ago   Pain Frequency Constant   Aggravating Factors  looking up, turning head, reaching with right arm, pain up to 9/10   Pain Relieving Factors gentle easy exercises that you gave me the last time I was here does seem to help   Effect of Pain on Daily Activities difficulty with ADL's due to pain and difficulty with activities            [facility] PT Assessment - 05/30/15 0001    Assessment   Medical Diagnosis debility, neck pain, kyphosis   Referring Provider Yaakov Guthrie   Onset Date/Surgical Date 05/20/15   Hand Dominance Right   Prior Therapy in the past yes   Balance Screen   Has the patient fallen in the past 6 months Yes   How many times? 4   Has the patient had a decrease in activity level because of  a fear of falling?  Yes   Is the patient reluctant to leave their home because of a fear of falling?  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 rounded shoulders, kyphotic at the C/T area   AROM   Overall AROM Comments cervical ROM was decreased 50% for flexion and rotation, decreased 100% for extension and sidebending,  right shoulder flexion 130, ER 60, IR 60 degrees with some pain in the right shoulder   Strength   Overall Strength Comments 3+/5 for the shoulder, with pain in the right  shoulder   Palpation   Palpation comment tender and very tight in the cervical paraspinals, tight in the upper traps   Transfers   Comments needs hands to stand   Ambulation/Gait   Gait Comments uses a cane, very slow, shuffles feet, unsteady, at times she uses the walls to steady herself   Standardized Balance Assessment   Standardized Balance Assessment Timed Up and Go Test;Berg Balance Test   Berg Balance Test   Sit to Stand Able to stand using hands after several tries   Standing Unsupported Able to stand 30 seconds unsupported   Sitting with Back Unsupported but Feet Supported on Floor or Stool Able to sit safely and securely 2 minutes   Stand to Sit Uses backs of legs against chair to control descent   Transfers Able to transfer safely, definite need of hands   Standing Unsupported with Eyes Closed Able to stand 10 seconds with supervision   Standing Ubsupported with Feet Together Able to place feet together independently and stand for 1 minute with supervision   From Standing, Reach Forward with Outstretched Arm Can reach forward >5 cm safely (2")   From Standing Position, Pick up Object from Floor Unable to pick up and needs supervision   From Standing Position, Turn to Look Behind Over each Shoulder Turn sideways only but maintains balance   Turn 360 Degrees Able to turn 360 degrees safely but slowly   Standing Unsupported, Alternately Place Feet on Step/Stool Needs assistance to keep from falling or unable to try   Standing Unsupported, One Foot in [employer] balance while stepping or standing   Standing on One Leg Unable to try or needs assist to prevent fall   Total Score 26   Timed Up and Go Test   Normal TUG (seconds) 25                   [facility] Adult PT Treatment/Exercise - 05/30/15 0001    Modalities   Modalities Electrical Stimulation;Moist Heat   Moist Heat Therapy   Number Minutes Moist Heat 15 Minutes   Moist Heat Location Cervical   Electrical  Stimulation   Electrical Stimulation Location cervical   Electrical Stimulation Action IFC   Electrical Stimulation Parameters tolerance   Electrical Stimulation Goals Pain                  PT Short Term Goals - 05/30/15 1147    PT SHORT TERM GOAL #1   Title independent  with initial HEP   Time 2   Period Weeks   Status New           PT Long Term Goals - 06-08-15 1147    PT LONG TERM GOAL #1   Title independent with advance HEP to include balance    Time 8   Period Weeks   Status New   PT LONG TERM GOAL #2   Title decrease pain in the right shoulder 50%    Time 8   Period Weeks   Status New   PT LONG TERM GOAL #3   Title increase IR to better dress herself to 72 degrees   Time 8   Period Weeks   Status New   PT LONG TERM GOAL #4   Title decrease neck and right shoulder pain 50%   Time 8   Period Weeks   Status New   PT LONG TERM GOAL #5   Title no falls over a 4 week period   Time 8   Period Weeks   Status New               Plan - 08-Jun-2015 1144    Clinical Impression Statement Patient with long standing neck and right shoulder pain with significant postural issues.  She also reports that she had 4 falls in the past 6 months, MD order mentions her deconditioning and posture.  She currently lives alone but has help from family and aides at least 3 days a week.   Pt will benefit from skilled therapeutic intervention in order to improve on the following deficits Abnormal gait;Cardiopulmonary status limiting activity;Decreased activity tolerance;Decreased balance;Decreased mobility;Decreased range of motion;Decreased safety awareness;Decreased strength;Difficulty walking;Increased muscle spasms;Improper body mechanics;Postural dysfunction;Pain   Rehab Potential Good   PT Frequency 2x / week   PT Duration 8 weeks   PT Treatment/Interventions ADLs/Self Care Home Management;Electrical Stimulation;Moist Heat;Therapeutic exercise;Therapeutic  activities;Functional mobility training;Gait training;Ultrasound;Traction;Patient/family education;Manual techniques;Passive range of motion   PT Next Visit Plan Slowly add exercises for posture, neck pain and work on balance for better and safer gait   Consulted and Agree with Plan of Care Patient          G-Codes - 2015/06/08 1148    Functional Assessment Tool Used foto 63% limitation   Functional Limitation Other PT primary   Other PT Primary Current Status [id number]) At least 60 percent but less than 80 percent impaired, limited or restricted   Other PT Primary Goal Status [id number]) At least 40 percent but less than 60 percent impaired, limited or restricted       Problem List Patient Active Problem List   Diagnosis Date Noted  . Dyspnea 11/23/2013    Sumner Boast., PT 2015/06/08, 11:56 AM  [address] [place], [place], [address] Phone: [phone]   Fax:  [phone]  Name: TRAMIKA GRANGE MRN: [id number] Date of Birth: Jul 26, 1922

## 2015-06-05 ENCOUNTER — Encounter: Payer: Self-pay | Admitting: Physical Therapy

## 2015-06-05 ENCOUNTER — Ambulatory Visit: Payer: Medicare Other | Attending: Family Medicine | Admitting: Physical Therapy

## 2015-06-05 DIAGNOSIS — L84 Corns and callosities: Secondary | ICD-10-CM | POA: Diagnosis not present

## 2015-06-05 DIAGNOSIS — R5381 Other malaise: Secondary | ICD-10-CM | POA: Diagnosis not present

## 2015-06-05 DIAGNOSIS — R262 Difficulty in walking, not elsewhere classified: Secondary | ICD-10-CM

## 2015-06-05 DIAGNOSIS — M25511 Pain in right shoulder: Secondary | ICD-10-CM | POA: Diagnosis not present

## 2015-06-05 DIAGNOSIS — M542 Cervicalgia: Secondary | ICD-10-CM | POA: Insufficient documentation

## 2015-06-05 DIAGNOSIS — I739 Peripheral vascular disease, unspecified: Secondary | ICD-10-CM | POA: Diagnosis not present

## 2015-06-05 DIAGNOSIS — R296 Repeated falls: Secondary | ICD-10-CM | POA: Diagnosis not present

## 2015-06-05 DIAGNOSIS — L603 Nail dystrophy: Secondary | ICD-10-CM | POA: Diagnosis not present

## 2015-06-05 NOTE — Therapy (Signed)
[address] [place], [place], [address] Phone: [phone]   Fax:  [phone]  Physical Therapy Treatment  Patient Details  Name: Alice Hayes MRN: [id number] Date of Birth: 1923-03-26 Referring Provider: Yaakov Guthrie  Encounter Date: 06/05/2015      PT End of Session - 06/05/15 1134    Visit Number 2   Date for PT Re-Evaluation 07/30/15   PT Start Time 1050   PT Stop Time 1133   PT Time Calculation (min) 43 min   Activity Tolerance Patient tolerated treatment well   Behavior During Therapy [facility] for tasks assessed/performed      Past Medical History  Diagnosis Date  . HTN (hypertension)   . Melanoma ([place])   . CAD (coronary artery disease)     nonobstructive cath 2006  . Cholelithiases   . Osteoporosis   . COPD (chronic obstructive pulmonary disease) ([place])   . Hyperlipidemia   . Osteoarthritis   . Diastolic heart failure [facility])     Past Surgical History  Procedure Laterality Date  . Appendectomy    . Breast lumpectomy    . Tonsillectomy and adenoidectomy    . Parotidectomy      1959  . Bladder suspension      2006  . Carpal tunnel release      2007  . Melanoma excision      2008  . Lymph node biopsy    . Cataract extraction      There were no vitals filed for this visit.  Visit Diagnosis:  Right shoulder pain  Falls frequently  Debility  Difficulty walking  Neck pain      Subjective Assessment - 06/05/15 1050    Subjective "I think it is doing better, I didn't have as much pain"   Currently in Pain? Yes   Pain Score 3    Pain Location Shoulder   Pain Orientation Left   Pain Descriptors / Indicators Discomfort                         [facility] Adult PT Treatment/Exercise - 06/05/15 0001    High Level Balance   High Level Balance Activities Side stepping;Tandem walking  All with HHA   High Level Balance Comments 10 sec stands on airex x2   Lumbar Exercises: Standing   Other Standing Lumbar Exercises Standing forward reaches with red ball; standing march 2x10    Other Standing Lumbar Exercises Standing Tband rows yellow 2x10   Lumbar Exercises: Seated   Long Arc Quad on Chair 2 sets;10 reps   Sit to Stand 5 reps  2 sets   Other Seated Lumbar Exercises Tband rows Red 2x10   Other Seated Lumbar Exercises seated march 2x10; seated froward reach with red ball x10   Knee/Hip Exercises: Aerobic   Nustep L2 x5 min   Knee/Hip Exercises: Seated   Hamstring Curl 2 sets;10 reps   Hamstring Limitations yellow Tband   Abduction/Adduction  1 set;10 reps;Both  manual resistance                  PT Short Term Goals - 05/30/15 1147    PT SHORT TERM GOAL #1   Title independent with initial HEP   Time 2   Period Weeks   Status New           PT Long Term Goals - 05/30/15 1147    PT LONG  TERM GOAL #1   Title independent with advance HEP to include balance    Time 8   Period Weeks   Status New   PT LONG TERM GOAL #2   Title decrease pain in the right shoulder 50%    Time 8   Period Weeks   Status New   PT LONG TERM GOAL #3   Title increase IR to better dress herself to 72 degrees   Time 8   Period Weeks   Status New   PT LONG TERM GOAL #4   Title decrease neck and right shoulder pain 50%   Time 8   Period Weeks   Status New   PT LONG TERM GOAL #5   Title no falls over a 4 week period   Time 8   Period Weeks   Status New               Plan - 06/05/15 1134    Clinical Impression Statement Pt progress to some low level gym interventions this date. Pt tolerated all balance interventions with 2 HHA. Pt does reports that she think she had a fall since eval but couldn't remember. Pt does have a wound on her anterior shin right above her foot. Forward head and shoulders VC's to erect posture.   Pt will benefit from skilled therapeutic intervention in order to improve on the following deficits Abnormal  gait;Cardiopulmonary status limiting activity;Decreased activity tolerance;Decreased balance;Decreased mobility;Decreased range of motion;Decreased safety awareness;Decreased strength;Difficulty walking;Increased muscle spasms;Improper body mechanics;Postural dysfunction;Pain   Rehab Potential Good   PT Frequency 2x / week   PT Duration 8 weeks   PT Treatment/Interventions ADLs/Self Care Home Management;Electrical Stimulation;Moist Heat;Therapeutic exercise;Therapeutic activities;Functional mobility training;Gait training;Ultrasound;Traction;Patient/family education;Manual techniques;Passive range of motion   PT Next Visit Plan Posture strengthening and balance.        Problem List Patient Active Problem List   Diagnosis Date Noted  . Dyspnea 11/23/2013    Scot Jun, PTA  06/05/2015, 11:37 AM  [address] [place], [place], [address] Phone: [phone]   Fax:  [phone]  Name: Alice Hayes MRN: [id number] Date of Birth: Oct 11, 1922

## 2015-06-12 ENCOUNTER — Encounter: Payer: Self-pay | Admitting: Physical Therapy

## 2015-06-12 ENCOUNTER — Ambulatory Visit: Payer: Medicare Other | Admitting: Physical Therapy

## 2015-06-12 DIAGNOSIS — M25511 Pain in right shoulder: Secondary | ICD-10-CM | POA: Diagnosis not present

## 2015-06-12 DIAGNOSIS — R262 Difficulty in walking, not elsewhere classified: Secondary | ICD-10-CM | POA: Diagnosis not present

## 2015-06-12 DIAGNOSIS — R5381 Other malaise: Secondary | ICD-10-CM | POA: Diagnosis not present

## 2015-06-12 DIAGNOSIS — M542 Cervicalgia: Secondary | ICD-10-CM

## 2015-06-12 DIAGNOSIS — R296 Repeated falls: Secondary | ICD-10-CM

## 2015-06-12 NOTE — Therapy (Addendum)
[facility] [address] [place], [place], [address] Phone: [phone]   Fax:  [phone]  Physical Therapy Treatment  Patient Details  Name: Alice Hayes MRN: [id number] Date of Birth: 04/16/1923 Referring Provider: Francis Wong  Encounter Date: 06/12/2015    Past Medical History  Diagnosis Date  . HTN (hypertension)   . Melanoma ([facility])   . CAD (coronary artery disease)     nonobstructive cath 2006  . Cholelithiases   . Osteoporosis   . COPD (chronic obstructive pulmonary disease) ([facility])   . Hyperlipidemia   . Osteoarthritis   . Diastolic heart failure ([facility])     Past Surgical History  Procedure Laterality Date  . Appendectomy    . Breast lumpectomy    . Tonsillectomy and adenoidectomy    . Parotidectomy      1959  . Bladder suspension      2006  . Carpal tunnel release      2007  . Melanoma excision      2008  . Lymph node biopsy    . Cataract extraction      There were no vitals filed for this visit.  Visit Diagnosis:  Difficulty walking  Debility  Falls frequently  Neck pain  Right shoulder pain      Subjective Assessment - 06/12/15 1021    Subjective Pt reports that the main reason she come is because of her neck and shoulders pain, "I really want to work on my posture"   Currently in Pain? Yes   Pain Score 6    Pain Location Shoulder   Pain Orientation Other (Comment);Right   Pain Descriptors / Indicators Discomfort                         [facility] Adult PT Treatment/Exercise - 06/12/15 0001    Lumbar Exercises: Standing   Other Standing Lumbar Exercises OHP with green weighted ball 2x10; standing forward reaches with red weighted ball    Other Standing Lumbar Exercises Tband rows and horizontal abduction red 2x10    Lumbar Exercises: Seated   Sit to Stand 10 reps  2 sets, no UE use    Lumbar Exercises: Supine   Clam 10 reps  red Tband, 2 sets   Bridge  10 reps;2 seconds  2 sets    Straight Leg Raise 10 reps;2 seconds   Knee/Hip Exercises: Aerobic   Nustep L3 x min                  PT Short Term Goals - 05/30/15 1147    PT SHORT TERM GOAL #1   Title independent with initial HEP   Time 2   Period Weeks   Status New           PT Long Term Goals - 05/30/15 1147    PT LONG TERM GOAL #1   Title independent with advance HEP to include balance    Time 8   Period Weeks   Status New   PT LONG TERM GOAL #2   Title decrease pain in the right shoulder 50%    Time 8   Period Weeks   Status New   PT LONG TERM GOAL #3   Title increase IR to better dress herself to 72 degrees   Time 8   Period Weeks   Status New   PT LONG TERM GOAL #4   Title decrease neck and   right shoulder pain 50%   Time 8   Period Weeks   Status New   PT LONG TERM GOAL #5   Title no falls over a 4 week period   Time 8   Period Weeks   Status New               Plan - 06/12/15 1059    Clinical Impression Statement Pt expressed that she would like to focus on her posture more. Pt tolerated postural exercises well that included standing scapular strengthen as well as supine core interventions. No pain reported during treatment.   Pt will benefit from skilled therapeutic intervention in order to improve on the following deficits Abnormal gait;Cardiopulmonary status limiting activity;Decreased activity tolerance;Decreased balance;Decreased mobility;Decreased range of motion;Decreased safety awareness;Decreased strength;Difficulty walking;Increased muscle spasms;Improper body mechanics;Postural dysfunction;Pain   Rehab Potential Good   PT Frequency 2x / week   PT Duration 8 weeks   PT Treatment/Interventions ADLs/Self Care Home Management;Electrical Stimulation;Moist Heat;Therapeutic exercise;Therapeutic activities;Functional mobility training;Gait training;Ultrasound;Traction;Patient/family education;Manual techniques;Passive range of motion    PT Next Visit Plan Posture strengthening and balance.        Problem List Patient Active Problem List   Diagnosis Date Noted  . Dyspnea 11/23/2013   PHYSICAL THERAPY DISCHARGE SUMMARY  Visits from Start of Care: 3   Plan: Patient agrees to discharge.  Patient goals were partially met. Patient is being discharged due to being pleased with the current functional level.  ?????   Patient stopped coming.  Michael Albright, PT    ALBRIGHT,MICHAEL W, PTA  07/16/2015, 4:17 PM  [facility] [address] [place], [place], [address] Phone: [phone]   Fax:  [phone]  Name: Alice Hayes MRN: [id number] Date of Birth: 06/04/1922     

## 2015-06-17 DIAGNOSIS — C44722 Squamous cell carcinoma of skin of right lower limb, including hip: Secondary | ICD-10-CM | POA: Diagnosis not present

## 2015-06-19 ENCOUNTER — Ambulatory Visit: Payer: Medicare Other | Admitting: Physical Therapy

## 2015-06-26 ENCOUNTER — Ambulatory Visit: Payer: Medicare Other | Admitting: Physical Therapy

## 2015-07-16 DIAGNOSIS — H353232 Exudative age-related macular degeneration, bilateral, with inactive choroidal neovascularization: Secondary | ICD-10-CM | POA: Diagnosis not present

## 2015-07-16 DIAGNOSIS — H43813 Vitreous degeneration, bilateral: Secondary | ICD-10-CM | POA: Diagnosis not present

## 2015-07-25 DIAGNOSIS — E039 Hypothyroidism, unspecified: Secondary | ICD-10-CM | POA: Diagnosis not present

## 2015-07-25 DIAGNOSIS — M159 Polyosteoarthritis, unspecified: Secondary | ICD-10-CM | POA: Diagnosis not present

## 2015-07-25 DIAGNOSIS — E7800 Pure hypercholesterolemia, unspecified: Secondary | ICD-10-CM | POA: Diagnosis not present

## 2015-07-25 DIAGNOSIS — I2510 Atherosclerotic heart disease of native coronary artery without angina pectoris: Secondary | ICD-10-CM | POA: Diagnosis not present

## 2015-07-25 DIAGNOSIS — E559 Vitamin D deficiency, unspecified: Secondary | ICD-10-CM | POA: Diagnosis not present

## 2015-07-25 DIAGNOSIS — G629 Polyneuropathy, unspecified: Secondary | ICD-10-CM | POA: Diagnosis not present

## 2015-07-25 DIAGNOSIS — I10 Essential (primary) hypertension: Secondary | ICD-10-CM | POA: Diagnosis not present

## 2015-07-25 DIAGNOSIS — M810 Age-related osteoporosis without current pathological fracture: Secondary | ICD-10-CM | POA: Diagnosis not present

## 2015-08-19 DIAGNOSIS — L84 Corns and callosities: Secondary | ICD-10-CM | POA: Diagnosis not present

## 2015-08-19 DIAGNOSIS — L603 Nail dystrophy: Secondary | ICD-10-CM | POA: Diagnosis not present

## 2015-08-19 DIAGNOSIS — I739 Peripheral vascular disease, unspecified: Secondary | ICD-10-CM | POA: Diagnosis not present

## 2015-09-02 DIAGNOSIS — M810 Age-related osteoporosis without current pathological fracture: Secondary | ICD-10-CM | POA: Diagnosis not present

## 2015-09-02 DIAGNOSIS — R269 Unspecified abnormalities of gait and mobility: Secondary | ICD-10-CM | POA: Diagnosis not present

## 2015-09-02 DIAGNOSIS — E039 Hypothyroidism, unspecified: Secondary | ICD-10-CM | POA: Diagnosis not present

## 2015-09-02 DIAGNOSIS — I10 Essential (primary) hypertension: Secondary | ICD-10-CM | POA: Diagnosis not present

## 2015-09-02 DIAGNOSIS — E559 Vitamin D deficiency, unspecified: Secondary | ICD-10-CM | POA: Diagnosis not present

## 2015-09-02 DIAGNOSIS — R296 Repeated falls: Secondary | ICD-10-CM | POA: Diagnosis not present

## 2015-09-02 DIAGNOSIS — I2510 Atherosclerotic heart disease of native coronary artery without angina pectoris: Secondary | ICD-10-CM | POA: Diagnosis not present

## 2015-09-02 DIAGNOSIS — M159 Polyosteoarthritis, unspecified: Secondary | ICD-10-CM | POA: Diagnosis not present

## 2015-09-02 DIAGNOSIS — G629 Polyneuropathy, unspecified: Secondary | ICD-10-CM | POA: Diagnosis not present

## 2015-09-02 DIAGNOSIS — Z0000 Encounter for general adult medical examination without abnormal findings: Secondary | ICD-10-CM | POA: Diagnosis not present

## 2015-09-02 DIAGNOSIS — E7800 Pure hypercholesterolemia, unspecified: Secondary | ICD-10-CM | POA: Diagnosis not present

## 2015-09-03 DIAGNOSIS — H53419 Scotoma involving central area, unspecified eye: Secondary | ICD-10-CM | POA: Diagnosis not present

## 2015-09-03 DIAGNOSIS — H542 Low vision, both eyes: Secondary | ICD-10-CM | POA: Diagnosis not present

## 2015-09-10 DIAGNOSIS — H542 Low vision, both eyes: Secondary | ICD-10-CM | POA: Diagnosis not present

## 2015-09-10 DIAGNOSIS — H53419 Scotoma involving central area, unspecified eye: Secondary | ICD-10-CM | POA: Diagnosis not present

## 2015-09-11 DIAGNOSIS — M810 Age-related osteoporosis without current pathological fracture: Secondary | ICD-10-CM | POA: Diagnosis not present

## 2015-09-16 ENCOUNTER — Ambulatory Visit: Payer: Medicare Other | Attending: Family Medicine | Admitting: Physical Therapy

## 2015-09-16 ENCOUNTER — Encounter: Payer: Self-pay | Admitting: Physical Therapy

## 2015-09-16 DIAGNOSIS — R293 Abnormal posture: Secondary | ICD-10-CM

## 2015-09-16 DIAGNOSIS — Z9181 History of falling: Secondary | ICD-10-CM | POA: Diagnosis not present

## 2015-09-16 DIAGNOSIS — M542 Cervicalgia: Secondary | ICD-10-CM | POA: Diagnosis not present

## 2015-09-16 DIAGNOSIS — R296 Repeated falls: Secondary | ICD-10-CM | POA: Insufficient documentation

## 2015-09-16 NOTE — Therapy (Signed)
[address] [place], [place], [address] Phone: [phone]   Fax:  [phone]  Physical Therapy Evaluation  Patient Details  Name: Alice Hayes MRN: [id number] Date of Birth: March 19, 1923 Referring Provider: Yaakov Guthrie  Encounter Date: 09/16/2015      PT End of Session - 09/16/15 1007    Visit Number 1   Date for PT Re-Evaluation 11/16/15   PT Start Time 0944   PT Stop Time 1033   PT Time Calculation (min) 49 min   Activity Tolerance Patient tolerated treatment well   Behavior During Therapy [facility] for tasks assessed/performed      Past Medical History  Diagnosis Date  . HTN (hypertension)   . Melanoma ([place])   . CAD (coronary artery disease)     nonobstructive cath 2006  . Cholelithiases   . Osteoporosis   . COPD (chronic obstructive pulmonary disease) ([place])   . Hyperlipidemia   . Osteoarthritis   . Diastolic heart failure [facility])     Past Surgical History  Procedure Laterality Date  . Appendectomy    . Breast lumpectomy    . Tonsillectomy and adenoidectomy    . Parotidectomy      1959  . Bladder suspension      2006  . Carpal tunnel release      2007  . Melanoma excision      2008  . Lymph node biopsy    . Cataract extraction      There were no vitals filed for this visit.       Subjective Assessment - 09/16/15 0947    Subjective Patient has neck and shoulder pain that is long standing, she has been seen here previously and has gotten good results with this.  She also reports two recent falls.    Patient Stated Goals have less pain and be able to hold my posture better, no falls   Currently in Pain? Yes   Pain Score 6    Pain Location Neck   Pain Orientation Left;Right   Pain Descriptors / Indicators Aching;Sore   Pain Type Chronic pain   Pain Onset More than a month ago   Pain Frequency Constant   Aggravating Factors  looking up, turning head, reaching pain can be up  to 9/10   Pain Relieving Factors she reports that hte treatment that we provided in the past helped the pain   Effect of Pain on Daily Activities falls, difficulty with ADL's            [facility] PT Assessment - 09/16/15 0001    Assessment   Medical Diagnosis neck pain   Referring Provider Yaakov Guthrie   Onset Date/Surgical Date 09/09/15   Hand Dominance Right   Prior Therapy in the past yes   Balance Screen   Has the patient fallen in the past 6 months Yes   How many times? 4   Has the patient had a decrease in activity level because of a fear of falling?  Yes   Is the patient reluctant to leave their home because of a fear of falling?  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  rounded shoulders, kyphotic at the C/T area   AROM   Overall AROM Comments Cervical ROM was decreased 100% for extension, decreased 50% for flexion and rotation, decreased 100% for sidebending, right shoulder AROM flexion 100, abduction 100, ER 80, IR 50 degrees   Strength   Overall Strength Comments 3+/5 for the shoulder, with pain in the right shoulder   Palpation   Palpation comment very tight and tender in the cervical parapsinals, some tightness in the upper traps   Ambulation/Gait   Gait Comments she uses a SPC, very slow gait, very forward head posture, slow gait, tends to want to use the walls to balance self, due to the severe kyphosis with forward head she has back extension and seems to hang on the hip flexors so she can see where she is going.   Berg Balance Test   Sit to Stand Able to stand  independently using hands   Standing Unsupported Able to stand 30 seconds unsupported   Sitting with Back  Unsupported but Feet Supported on Floor or Stool Able to sit safely and securely 2 minutes   Stand to Sit Controls descent by using hands   Transfers Able to transfer safely, definite need of hands   Standing Unsupported with Eyes Closed Able to stand 10 seconds with supervision   Standing Ubsupported with Feet Together Able to place feet together independently and stand for 1 minute with supervision   From Standing, Reach Forward with Outstretched Arm Can reach forward >5 cm safely (2")   From Standing Position, Pick up Object from Floor Unable to pick up shoe, but reaches 2-5 cm (1-2") from shoe and balances independently   From Standing Position, Turn to Look Behind Over each Shoulder Turn sideways only but maintains balance   Turn 360 Degrees Able to turn 360 degrees safely but slowly   Standing Unsupported, Alternately Place Feet on Step/Stool Able to complete 4 steps without aid or supervision   Standing Unsupported, One Foot in Front Needs help to step but can hold 15 seconds   Standing on One Leg Tries to lift leg/unable to hold 3 seconds but remains standing independently   Total Score 33   Timed Up and Go Test   Normal TUG (seconds) 28                   [facility] Adult PT Treatment/Exercise - 09/16/15 0001    Modalities   Modalities Electrical Stimulation;Moist Heat   Moist Heat Therapy   Number Minutes Moist Heat 15 Minutes   Moist Heat Location Cervical   Electrical Stimulation   Electrical Stimulation Location cervical   Electrical Stimulation Action IFC   Electrical Stimulation Parameters supine   Electrical Stimulation Goals Pain                  PT Short Term Goals - 09/16/15 1009    PT SHORT TERM GOAL #1   Title independent with initial HEP   Time 2   Period Weeks   Status New           PT Long Term Goals - 09/16/15 1009    PT LONG TERM GOAL #1   Title independent with advance HEP to include balance    Time 8   Period Weeks   Status  New   PT LONG TERM GOAL #2   Title decrease pain in the right shoulder 50%    Time 8   Period Weeks   Status New  PT LONG TERM GOAL #3   Title increase Berg balance score to 43/56   Time 8   Period Weeks   Status New   PT LONG TERM GOAL #4   Title decrease TUG time to 19 seconds   Time 8   Period Weeks   Status New   PT LONG TERM GOAL #5   Title no falls over a 4 week period   Time 8   Period Weeks   Status New               Plan - 09/16/15 1007    Clinical Impression Statement Patient returns to [place] with 2 falls recently, she also continues to have neck and shoulder pain, she gained some in the Berg balance score since December but was slower witht he TUG score.     Rehab Potential Good   PT Frequency 2x / week   PT Duration 8 weeks   PT Treatment/Interventions ADLs/Self Care Home Management;Electrical Stimulation;Moist Heat;Therapeutic exercise;Therapeutic activities;Functional mobility training;Gait training;Ultrasound;Traction;Patient/family education;Manual techniques;Passive range of motion   PT Next Visit Plan Posture strengthening and balance.   Consulted and Agree with Plan of Care Patient      Patient will benefit from skilled therapeutic intervention in order to improve the following deficits and impairments:  Abnormal gait, Cardiopulmonary status limiting activity, Decreased activity tolerance, Decreased balance, Decreased mobility, Decreased range of motion, Decreased safety awareness, Decreased strength, Difficulty walking, Increased muscle spasms, Improper body mechanics, Postural dysfunction, Pain  Visit Diagnosis: Cervicalgia - Plan: PT plan of care cert/re-cert  Abnormal posture - Plan: PT plan of care cert/re-cert  History of falling - Plan: PT plan of care cert/re-cert      G-Codes - [id number]    Functional Assessment Tool Used foto 64% limitaiton   Functional Limitation Other PT primary   Other PT Primary Current Status [id number]) At least  60 percent but less than 80 percent impaired, limited or restricted   Other PT Primary Goal Status [id number]) At least 40 percent but less than 60 percent impaired, limited or restricted       Problem List Patient Active Problem List   Diagnosis Date Noted  . Dyspnea 11/23/2013    Sumner Boast., PT 09/16/2015, 10:18 AM  [address] [place], [place], [address] Phone: [phone]   Fax:  [phone]  Name: DESTYNI OGG MRN: [id number] Date of Birth: [birth year]

## 2015-09-17 DIAGNOSIS — H53419 Scotoma involving central area, unspecified eye: Secondary | ICD-10-CM | POA: Diagnosis not present

## 2015-09-17 DIAGNOSIS — H542 Low vision, both eyes: Secondary | ICD-10-CM | POA: Diagnosis not present

## 2015-09-18 ENCOUNTER — Ambulatory Visit: Payer: Medicare Other | Admitting: Physical Therapy

## 2015-09-18 ENCOUNTER — Encounter: Payer: Self-pay | Admitting: Physical Therapy

## 2015-09-18 DIAGNOSIS — R296 Repeated falls: Secondary | ICD-10-CM

## 2015-09-18 DIAGNOSIS — R293 Abnormal posture: Secondary | ICD-10-CM | POA: Diagnosis not present

## 2015-09-18 DIAGNOSIS — M542 Cervicalgia: Secondary | ICD-10-CM

## 2015-09-18 DIAGNOSIS — Z9181 History of falling: Secondary | ICD-10-CM | POA: Diagnosis not present

## 2015-09-18 NOTE — Therapy (Signed)
[place] [address] [place], [place], [address] Phone: [phone]   Fax:  [phone]  Physical Therapy Treatment  Patient Details  Name: Alice Hayes MRN: [id number] Date of Birth: Nov 05, 1922 Referring Provider: Yaakov Guthrie  Encounter Date: 09/18/2015      PT End of Session - 09/18/15 1435    Visit Number 2   Date for PT Re-Evaluation 11/16/15   PT Start Time 1350   PT Stop Time 1448   PT Time Calculation (min) 58 min   Activity Tolerance Patient tolerated treatment well   Behavior During Therapy [facility] for tasks assessed/performed      Past Medical History  Diagnosis Date  . HTN (hypertension)   . Melanoma ([place])   . CAD (coronary artery disease)     nonobstructive cath 2006  . Cholelithiases   . Osteoporosis   . COPD (chronic obstructive pulmonary disease) ([place])   . Hyperlipidemia   . Osteoarthritis   . Diastolic heart failure [facility])     Past Surgical History  Procedure Laterality Date  . Appendectomy    . Breast lumpectomy    . Tonsillectomy and adenoidectomy    . Parotidectomy      1959  . Bladder suspension      2006  . Carpal tunnel release      2007  . Melanoma excision      2008  . Lymph node biopsy    . Cataract extraction      There were no vitals filed for this visit.      Subjective Assessment - 09/18/15 1351    Subjective Pt reports no pain since evaluation   Currently in Pain? Yes   Pain Score 5    Pain Location --  neck, R shoulder                         [facility] Adult PT Treatment/Exercise - 09/18/15 0001    Posture/Postural Control   Posture Comments significant forward head and rounded shoulders, kyphotic at the C/T area   High Level Balance   High Level Balance Activities Side stepping;Backward walking   Lumbar Exercises: Aerobic   UBE (Upper Arm Bike) NuStep L3 x7 min    Lumbar Exercises: Standing   Other Standing Lumbar Exercises  Standing shoulder hori abd 2x10 yellow Tband; Standing bilat shoulder flex with red ball 2x10   Lumbar Exercises: Seated   Long Arc Quad on Chair 2 sets;10 reps   LAQ on Chair Weights (lbs) 2   Sit to Stand 10 reps  2 sets   Other Seated Lumbar Exercises Tband rows yellow 2x15   Other Seated Lumbar Exercises seated march 2x10, Seated hip abd 2x10 (manual resistance )   Modalities   Modalities Electrical Stimulation;Moist Heat   Moist Heat Therapy   Number Minutes Moist Heat 15 Minutes   Moist Heat Location Cervical   Electrical Stimulation   Electrical Stimulation Location cervical   Electrical Stimulation Action IFC   Electrical Stimulation Parameters supine   Electrical Stimulation Goals Pain                  PT Short Term Goals - 09/16/15 1009    PT SHORT TERM GOAL #1   Title independent with initial HEP   Time 2   Period Weeks   Status New           PT Long Term Goals -  09/16/15 1009    PT LONG TERM GOAL #1   Title independent with advance HEP to include balance    Time 8   Period Weeks   Status New   PT LONG TERM GOAL #2   Title decrease pain in the right shoulder 50%    Time 8   Period Weeks   Status New   PT LONG TERM GOAL #3   Title increase Berg balance score to 43/56   Time 8   Period Weeks   Status New   PT LONG TERM GOAL #4   Title decrease TUG time to 19 seconds   Time 8   Period Weeks   Status New   PT LONG TERM GOAL #5   Title no falls over a 4 week period   Time 8   Period Weeks   Status New               Plan - 09/18/15 1436    Clinical Impression Statement Pt reports neck and R shoulder pain. Despite pain does complete all exercises without reports of increase pain. Mild unsteadiness with backwards walking. She reports that she thinks her fall are coming from her floor not being level at home.   Rehab Potential Good   PT Frequency 2x / week   PT Duration 8 weeks   PT Treatment/Interventions ADLs/Self Care Home  Management;Electrical Stimulation;Moist Heat;Therapeutic exercise;Therapeutic activities;Functional mobility training;Gait training;Ultrasound;Traction;Patient/family education;Manual techniques;Passive range of motion   PT Next Visit Plan Posture strengthening and balance.      Patient will benefit from skilled therapeutic intervention in order to improve the following deficits and impairments:  Abnormal gait, Cardiopulmonary status limiting activity, Decreased activity tolerance, Decreased balance, Decreased mobility, Decreased range of motion, Decreased safety awareness, Decreased strength, Difficulty walking, Increased muscle spasms, Improper body mechanics, Postural dysfunction, Pain  Visit Diagnosis: Cervicalgia  Abnormal posture  Falls frequently     Problem List Patient Active Problem List   Diagnosis Date Noted  . Dyspnea 11/23/2013    Scot Jun, PTA  09/18/2015, 2:41 PM  [address] [place], [place], [address] Phone: [phone]   Fax:  [phone]  Name: Alice Hayes MRN: [id number] Date of Birth: 1922-10-19

## 2015-09-23 ENCOUNTER — Ambulatory Visit: Payer: Medicare Other | Admitting: Physical Therapy

## 2015-09-23 ENCOUNTER — Encounter: Payer: Self-pay | Admitting: Physical Therapy

## 2015-09-23 DIAGNOSIS — R296 Repeated falls: Secondary | ICD-10-CM

## 2015-09-23 DIAGNOSIS — M542 Cervicalgia: Secondary | ICD-10-CM | POA: Diagnosis not present

## 2015-09-23 DIAGNOSIS — R293 Abnormal posture: Secondary | ICD-10-CM | POA: Diagnosis not present

## 2015-09-23 DIAGNOSIS — Z9181 History of falling: Secondary | ICD-10-CM | POA: Diagnosis not present

## 2015-09-23 NOTE — Therapy (Signed)
[address] [place], [place], [address] Phone: [phone]   Fax:  [phone]  Physical Therapy Treatment  Patient Details  Name: Alice Hayes MRN: [id number] Date of Birth: 1922-07-18 Referring Provider: Yaakov Guthrie  Encounter Date: 09/23/2015      PT End of Session - 09/23/15 1015    Visit Number 3   Date for PT Re-Evaluation 11/16/15   PT Start Time 0930   PT Stop Time 1025   PT Time Calculation (min) 55 min   Activity Tolerance Patient tolerated treatment well   Behavior During Therapy [facility] for tasks assessed/performed      Past Medical History  Diagnosis Date  . HTN (hypertension)   . Melanoma ([place])   . CAD (coronary artery disease)     nonobstructive cath 2006  . Cholelithiases   . Osteoporosis   . COPD (chronic obstructive pulmonary disease) ([place])   . Hyperlipidemia   . Osteoarthritis   . Diastolic heart failure [facility])     Past Surgical History  Procedure Laterality Date  . Appendectomy    . Breast lumpectomy    . Tonsillectomy and adenoidectomy    . Parotidectomy      1959  . Bladder suspension      2006  . Carpal tunnel release      2007  . Melanoma excision      2008  . Lymph node biopsy    . Cataract extraction      There were no vitals filed for this visit.      Subjective Assessment - 09/23/15 0937    Subjective Pt reports that she always have shoulder pain and she lives with it. Pt also reports that she hasn't noticed but some people have told her that she veers to the right when walking   Currently in Pain? Yes   Pain Score 5    Pain Location Shoulder   Pain Orientation Right                         [facility] Adult PT Treatment/Exercise - 09/23/15 0001    High Level Balance   High Level Balance Activities Backward walking;Side stepping;Marching forwards;Negotiating over obstacles;Negotitating around obstacles  HHA required with marching  over obstacles   Lumbar Exercises: Aerobic   UBE (Upper Arm Bike) NuStep L3 x6 min    Lumbar Exercises: Standing   Row 10 reps;Theraband   Theraband Level (Row) Level 2 (Red)   Shoulder Extension 10 reps;Theraband  2 sets    Theraband Level (Shoulder Extension) Level 2 (Red)   Other Standing Lumbar Exercises Standind shoulder hori abd 2x10 yellow Tband; Standing bilat shoulder flex with red ball 2x10   Knee/Hip Exercises: Seated   Long Arc Quad 2 sets;10 reps   Long Arc Quad Weight 3 lbs.   Modalities   Modalities Moist Heat   Moist Heat Therapy   Number Minutes Moist Heat 10 Minutes   Moist Heat Location Cervical;Lumbar Spine                  PT Short Term Goals - 09/23/15 1017    PT SHORT TERM GOAL #1   Title independent with initial HEP   Status Achieved           PT Long Term Goals - 09/16/15 1009    PT LONG TERM GOAL #1   Title independent with advance HEP to include balance  Time 8   Period Weeks   Status New   PT LONG TERM GOAL #2   Title decrease pain in the right shoulder 50%    Time 8   Period Weeks   Status New   PT LONG TERM GOAL #3   Title increase Berg balance score to 43/56   Time 8   Period Weeks   Status New   PT LONG TERM GOAL #4   Title decrease TUG time to 19 seconds   Time 8   Period Weeks   Status New   PT LONG TERM GOAL #5   Title no falls over a 4 week period   Time 8   Period Weeks   Status New               Plan - 09/23/15 1016    Clinical Impression Statement More balance interventions this day. Pt requires CGA to SBA to complete all interventions. Some issues walking over objects with foot clearance. Completed all shoulder interventions in standing.   Rehab Potential Good   PT Frequency 2x / week   PT Duration 8 weeks   PT Treatment/Interventions ADLs/Self Care Home Management;Electrical Stimulation;Moist Heat;Therapeutic exercise;Therapeutic activities;Functional mobility training;Gait  training;Ultrasound;Traction;Patient/family education;Manual techniques;Passive range of motion   PT Next Visit Plan Posture strengthening and balance.      Patient will benefit from skilled therapeutic intervention in order to improve the following deficits and impairments:  Abnormal gait, Cardiopulmonary status limiting activity, Decreased activity tolerance, Decreased balance, Decreased mobility, Decreased range of motion, Decreased safety awareness, Decreased strength, Difficulty walking, Increased muscle spasms, Improper body mechanics, Postural dysfunction, Pain  Visit Diagnosis: Cervicalgia  Falls frequently  Abnormal posture     Problem List Patient Active Problem List   Diagnosis Date Noted  . Dyspnea 11/23/2013    Scot Jun, PTA  09/23/2015, 10:18 AM  [address] [place], [place], [address] Phone: [phone]   Fax:  [phone]  Name: KOHARU BIBIAN MRN: [id number] Date of Birth: 1922/07/21

## 2015-09-25 ENCOUNTER — Ambulatory Visit: Payer: Medicare Other | Admitting: Physical Therapy

## 2015-09-26 DIAGNOSIS — H53419 Scotoma involving central area, unspecified eye: Secondary | ICD-10-CM | POA: Diagnosis not present

## 2015-09-26 DIAGNOSIS — H542 Low vision, both eyes: Secondary | ICD-10-CM | POA: Diagnosis not present

## 2015-09-30 ENCOUNTER — Ambulatory Visit: Payer: Medicare Other | Attending: Family Medicine | Admitting: Physical Therapy

## 2015-09-30 ENCOUNTER — Encounter: Payer: Self-pay | Admitting: Physical Therapy

## 2015-09-30 DIAGNOSIS — R293 Abnormal posture: Secondary | ICD-10-CM

## 2015-09-30 DIAGNOSIS — R296 Repeated falls: Secondary | ICD-10-CM | POA: Diagnosis not present

## 2015-09-30 DIAGNOSIS — M542 Cervicalgia: Secondary | ICD-10-CM | POA: Diagnosis not present

## 2015-09-30 NOTE — Therapy (Signed)
[place] [address] [place], [place], [address] Phone: [phone]   Fax:  [phone]  Physical Therapy Treatment  Patient Details  Name: Alice Hayes MRN: [id number] Date of Birth: March 19, 1923 Referring Provider: Yaakov Guthrie  Encounter Date: 09/30/2015      PT End of Session - 09/30/15 1014    Visit Number 4   Date for PT Re-Evaluation 11/16/15   PT Start Time 0940   PT Stop Time 1024   PT Time Calculation (min) 44 min   Activity Tolerance Patient tolerated treatment well   Behavior During Therapy [facility] for tasks assessed/performed      Past Medical History  Diagnosis Date  . HTN (hypertension)   . Melanoma ([place])   . CAD (coronary artery disease)     nonobstructive cath 2006  . Cholelithiases   . Osteoporosis   . COPD (chronic obstructive pulmonary disease) ([place])   . Hyperlipidemia   . Osteoarthritis   . Diastolic heart failure [facility])     Past Surgical History  Procedure Laterality Date  . Appendectomy    . Breast lumpectomy    . Tonsillectomy and adenoidectomy    . Parotidectomy      1959  . Bladder suspension      2006  . Carpal tunnel release      2007  . Melanoma excision      2008  . Lymph node biopsy    . Cataract extraction      There were no vitals filed for this visit.      Subjective Assessment - 09/30/15 0942    Subjective "After the exercises I seem better"   Currently in Pain? Yes   Pain Score 7    Pain Location --  neck and shoulder             [facility] PT Assessment - 09/30/15 0001    Berg Balance Test   Sit to Stand Able to stand without using hands and stabilize independently   Standing Unsupported Able to stand safely 2 minutes   Sitting with Back Unsupported but Feet Supported on Floor or Stool Able to sit safely and securely 2 minutes   Stand to Sit Sits safely with minimal use of hands   Transfers Able to transfer safely, definite need of hands   Standing Unsupported with Eyes Closed Able to stand 10 seconds safely   Standing Ubsupported with Feet Together Able to place feet together independently and stand 1 minute safely   From Standing, Reach Forward with Outstretched Arm Can reach forward >12 cm safely (5")   From Standing Position, Pick up Object from Floor Able to pick up shoe, needs supervision   From Standing Position, Turn to Look Behind Over each Shoulder Looks behind one side only/other side shows less weight shift   Turn 360 Degrees Able to turn 360 degrees safely but slowly   Standing Unsupported, Alternately Place Feet on Step/Stool Able to complete 4 steps without aid or supervision   Standing Unsupported, One Foot in Front Able to take small step independently and hold 30 seconds   Standing on One Leg Tries to lift leg/unable to hold 3 seconds but remains standing independently   Total Score 43   Timed Up and Go Test   TUG Normal TUG   Normal TUG (seconds) 11.11                     [facility]  Adult PT Treatment/Exercise - 09/30/15 0001    Lumbar Exercises: Aerobic   UBE (Upper Arm Bike) NuStep L3 x6 min    Lumbar Exercises: Seated   Other Seated Lumbar Exercises Tband rows red 2x15   Other Seated Lumbar Exercises seated march 2x10, Seated hip abd 2x10 (manual resistance )   Modalities   Modalities Moist Heat   Moist Heat Therapy   Number Minutes Moist Heat 10 Minutes   Moist Heat Location Cervical;Lumbar Spine                  PT Short Term Goals - 09/23/15 1017    PT SHORT TERM GOAL #1   Title independent with initial HEP   Status Achieved           PT Long Term Goals - 09/30/15 1004    PT LONG TERM GOAL #3   Title increase Berg balance score to 43/56   Status Achieved   PT LONG TERM GOAL #4   Title decrease TUG time to 19 seconds   Status Achieved               Plan - 09/30/15 1015    Clinical Impression Statement Pt 10 minutes late. Pt has progressed and completed  two of her goals. Pt does require repeated instructions to complete all interventions   PT Frequency 2x / week   PT Duration 8 weeks   PT Treatment/Interventions ADLs/Self Care Home Management;Electrical Stimulation;Moist Heat;Therapeutic exercise;Therapeutic activities;Functional mobility training;Gait training;Ultrasound;Traction;Patient/family education;Manual techniques;Passive range of motion   PT Next Visit Plan Posture strengthening and balance.      Patient will benefit from skilled therapeutic intervention in order to improve the following deficits and impairments:  Abnormal gait, Cardiopulmonary status limiting activity, Decreased activity tolerance, Decreased balance, Decreased mobility, Decreased range of motion, Decreased safety awareness, Decreased strength, Difficulty walking, Increased muscle spasms, Improper body mechanics, Postural dysfunction, Pain  Visit Diagnosis: Cervicalgia  Falls frequently  Abnormal posture     Problem List Patient Active Problem List   Diagnosis Date Noted  . Dyspnea 11/23/2013    Scot Jun, PTA  09/30/2015, 10:21 AM  [address] [place], [place], [address] Phone: [phone]   Fax:  [phone]  Name: Alice Hayes MRN: [id number] Date of Birth: 08/19/1922

## 2015-10-04 DIAGNOSIS — H542 Low vision, both eyes: Secondary | ICD-10-CM | POA: Diagnosis not present

## 2015-10-04 DIAGNOSIS — H53419 Scotoma involving central area, unspecified eye: Secondary | ICD-10-CM | POA: Diagnosis not present

## 2015-10-07 ENCOUNTER — Encounter: Payer: Medicare Other | Admitting: Physical Therapy

## 2015-10-07 ENCOUNTER — Other Ambulatory Visit: Payer: Self-pay | Admitting: Dermatology

## 2015-10-07 DIAGNOSIS — L820 Inflamed seborrheic keratosis: Secondary | ICD-10-CM | POA: Diagnosis not present

## 2015-10-07 DIAGNOSIS — L570 Actinic keratosis: Secondary | ICD-10-CM | POA: Diagnosis not present

## 2015-10-07 DIAGNOSIS — D485 Neoplasm of uncertain behavior of skin: Secondary | ICD-10-CM | POA: Diagnosis not present

## 2015-10-09 ENCOUNTER — Encounter: Payer: Self-pay | Admitting: Physical Therapy

## 2015-10-09 ENCOUNTER — Ambulatory Visit: Payer: Medicare Other | Admitting: Physical Therapy

## 2015-10-09 DIAGNOSIS — R293 Abnormal posture: Secondary | ICD-10-CM | POA: Diagnosis not present

## 2015-10-09 DIAGNOSIS — R296 Repeated falls: Secondary | ICD-10-CM

## 2015-10-09 DIAGNOSIS — M542 Cervicalgia: Secondary | ICD-10-CM

## 2015-10-09 NOTE — Therapy (Signed)
[place] [address] [place], [place], [address] Phone: [phone]   Fax:  [phone]  Physical Therapy Treatment  Patient Details  Name: Alice Hayes MRN: [id number] Date of Birth: 11-25-1922 Referring Provider: Yaakov Guthrie  Encounter Date: 10/09/2015      PT End of Session - 10/09/15 1349    Visit Number 5   Date for PT Re-Evaluation 11/16/15   PT Start Time 1300   PT Stop Time 1400   PT Time Calculation (min) 60 min   Activity Tolerance Patient tolerated treatment well   Behavior During Therapy [facility] for tasks assessed/performed      Past Medical History  Diagnosis Date  . HTN (hypertension)   . Melanoma ([place])   . CAD (coronary artery disease)     nonobstructive cath 2006  . Cholelithiases   . Osteoporosis   . COPD (chronic obstructive pulmonary disease) ([place])   . Hyperlipidemia   . Osteoarthritis   . Diastolic heart failure [facility])     Past Surgical History  Procedure Laterality Date  . Appendectomy    . Breast lumpectomy    . Tonsillectomy and adenoidectomy    . Parotidectomy      1959  . Bladder suspension      2006  . Carpal tunnel release      2007  . Melanoma excision      2008  . Lymph node biopsy    . Cataract extraction      There were no vitals filed for this visit.      Subjective Assessment - 10/09/15 1256    Subjective "I was feeling better then it hit me" Pt reports that she had pain in her neck that went into her shoulders   Currently in Pain? Yes   Pain Score 4    Pain Location --  Neck and shoulders                         [facility] Adult PT Treatment/Exercise - 10/09/15 0001    High Level Balance   High Level Balance Activities Backward walking;Side stepping;Marching forwards;Negotiating over obstacles;Negotiating around obstacles   High Level Balance Comments Ambulated around clinic picking up objects from floor and various heights; March on  airex, Stan dinging on airex 3x10 sec   Lumbar Exercises: Aerobic   UBE (Upper Arm Bike) NuStep L3 x6 min    Lumbar Exercises: Standing   Row 15 reps;Theraband   Theraband Level (Row) Level 1 (Yellow)   Shoulder Extension 15 reps;Theraband   Theraband Level (Shoulder Extension) Level 1 (Yellow)   Lumbar Exercises: Supine   Bridge 15 reps;1 second   Straight Leg Raise 2 seconds;15 reps   Other Supine Lumbar Exercises Bilat SLR 2 x10   Modalities   Modalities Moist Heat   Moist Heat Therapy   Number Minutes Moist Heat 15 Minutes   Moist Heat Location Cervical;Lumbar Spine   Electrical Stimulation   Electrical Stimulation Location cervical   Electrical Stimulation Action pre mod   Electrical Stimulation Parameters supine   Electrical Stimulation Goals Pain                  PT Short Term Goals - 09/23/15 1017    PT SHORT TERM GOAL #1   Title independent with initial HEP   Status Achieved           PT Long Term Goals - 09/30/15 1004  PT LONG TERM GOAL #3   Title increase Berg balance score to 43/56   Status Achieved   PT LONG TERM GOAL #4   Title decrease TUG time to 19 seconds   Status Achieved               Plan - 10/09/15 1349    Clinical Impression Statement Difficulty with negotiation around objects. LOB x2 with walking march. Unable to maintain positioning when marching on airex. Posterior lean on airex unable to balance for a full 10 sec. Does perform standing lumbar interventions well.    Rehab Potential Good   PT Frequency 2x / week   PT Duration 8 weeks   PT Treatment/Interventions ADLs/Self Care Home Management;Electrical Stimulation;Moist Heat;Therapeutic exercise;Therapeutic activities;Functional mobility training;Gait training;Ultrasound;Traction;Patient/family education;Manual techniques;Passive range of motion   PT Next Visit Plan Posture strengthening and balance.      Patient will benefit from skilled therapeutic intervention in  order to improve the following deficits and impairments:  Abnormal gait, Cardiopulmonary status limiting activity, Decreased activity tolerance, Decreased balance, Decreased mobility, Decreased range of motion, Decreased safety awareness, Decreased strength, Difficulty walking, Increased muscle spasms, Improper body mechanics, Postural dysfunction, Pain  Visit Diagnosis: Cervicalgia  Falls frequently  Abnormal posture     Problem List Patient Active Problem List   Diagnosis Date Noted  . Dyspnea 11/23/2013    Scot Jun, PTA  10/09/2015, 1:53 PM  [address] [place], [place], [address] Phone: [phone]   Fax:  [phone]  Name: ZURA LANCTO MRN: [id number] Date of Birth: 1922/06/24

## 2015-10-16 DIAGNOSIS — H542 Low vision, both eyes: Secondary | ICD-10-CM | POA: Diagnosis not present

## 2015-10-16 DIAGNOSIS — H53419 Scotoma involving central area, unspecified eye: Secondary | ICD-10-CM | POA: Diagnosis not present

## 2015-10-17 DIAGNOSIS — S51019A Laceration without foreign body of unspecified elbow, initial encounter: Secondary | ICD-10-CM | POA: Diagnosis not present

## 2015-10-17 DIAGNOSIS — W19XXXA Unspecified fall, initial encounter: Secondary | ICD-10-CM | POA: Diagnosis not present

## 2015-10-17 DIAGNOSIS — Z23 Encounter for immunization: Secondary | ICD-10-CM | POA: Diagnosis not present

## 2015-10-23 ENCOUNTER — Ambulatory Visit: Payer: Medicare Other | Admitting: Physical Therapy

## 2015-10-24 DIAGNOSIS — H353232 Exudative age-related macular degeneration, bilateral, with inactive choroidal neovascularization: Secondary | ICD-10-CM | POA: Diagnosis not present

## 2015-10-30 ENCOUNTER — Encounter: Payer: Self-pay | Admitting: Physical Therapy

## 2015-10-30 ENCOUNTER — Ambulatory Visit: Payer: Medicare Other | Admitting: Physical Therapy

## 2015-10-30 DIAGNOSIS — R296 Repeated falls: Secondary | ICD-10-CM

## 2015-10-30 DIAGNOSIS — R293 Abnormal posture: Secondary | ICD-10-CM | POA: Diagnosis not present

## 2015-10-30 DIAGNOSIS — M542 Cervicalgia: Secondary | ICD-10-CM

## 2015-10-30 NOTE — Therapy (Signed)
[place] [address] [place], [place], [address] Phone: [phone]   Fax:  [phone]  Physical Therapy Treatment  Patient Details  Name: Alice Hayes MRN: [id number] Date of Birth: July 02, 1922 Referring Provider: Yaakov Guthrie  Encounter Date: 10/30/2015      PT End of Session - 10/30/15 1150    Visit Number 6   Date for PT Re-Evaluation 11/16/15   PT Start Time 1108   PT Stop Time 1159   PT Time Calculation (min) 51 min   Activity Tolerance Patient tolerated treatment well   Behavior During Therapy [facility] for tasks assessed/performed      Past Medical History  Diagnosis Date  . HTN (hypertension)   . Melanoma ([place])   . CAD (coronary artery disease)     nonobstructive cath 2006  . Cholelithiases   . Osteoporosis   . COPD (chronic obstructive pulmonary disease) ([place])   . Hyperlipidemia   . Osteoarthritis   . Diastolic heart failure [facility])     Past Surgical History  Procedure Laterality Date  . Appendectomy    . Breast lumpectomy    . Tonsillectomy and adenoidectomy    . Parotidectomy      1959  . Bladder suspension      2006  . Carpal tunnel release      2007  . Melanoma excision      2008  . Lymph node biopsy    . Cataract extraction      There were no vitals filed for this visit.      Subjective Assessment - 10/30/15 1112    Subjective "Ive had a few falls" Pt reports that she when to the MD and they concluded she wasnt drinking enough water.   Currently in Pain? Yes   Pain Score 7    Pain Location --  Neck and shoulders. "But im living with it, It gets worst in the afternoon"            [facility] PT Assessment - 10/30/15 0001    Berg Balance Test   Sit to Stand Able to stand without using hands and stabilize independently   Standing Unsupported Able to stand safely 2 minutes   Sitting with Back Unsupported but Feet Supported on Floor or Stool Able to sit safely and securely  2 minutes   Stand to Sit Sits safely with minimal use of hands   Transfers Able to transfer safely, minor use of hands   Standing Unsupported with Eyes Closed Able to stand 10 seconds safely   Standing Ubsupported with Feet Together Able to place feet together independently and stand 1 minute safely   From Standing, Reach Forward with Outstretched Arm Can reach confidently >25 cm (10")   From Standing Position, Pick up Object from Floor Able to pick up shoe safely and easily   From Standing Position, Turn to Look Behind Over each Shoulder Looks behind one side only/other side shows less weight shift   Turn 360 Degrees Able to turn 360 degrees safely but slowly   Standing Unsupported, Alternately Place Feet on Step/Stool Able to stand independently and complete 8 steps >20 seconds   Standing Unsupported, One Foot in Front Able to take small step independently and hold 30 seconds   Standing on One Leg Tries to lift leg/unable to hold 3 seconds but remains standing independently   Total Score 47  [facility] Adult PT Treatment/Exercise - 10/30/15 0001    High Level Balance   High Level Balance Activities Backward walking;Side stepping;Marching forwards;Negotiating over obstacles;Negotitating around obstacles   Lumbar Exercises: Stretches   Passive Hamstring Stretch 3 reps;10 seconds   Lumbar Exercises: Aerobic   UBE (Upper Arm Bike) NuStep L3 x6 min    Lumbar Exercises: Standing   Row 15 reps;Theraband   Theraband Level (Row) Level 1 (Yellow)   Shoulder Extension 15 reps;Theraband   Theraband Level (Shoulder Extension) Level 1 (Yellow)   Knee/Hip Exercises: Standing   Other Standing Knee Exercises Standing march 1.5 x10  heavy posterior lean.   Knee/Hip Exercises: Seated   Long Arc Quad 2 sets;15 reps   Long Arc Quad Weight 2 lbs.   Other Seated Knee/Hip Exercises Marching 1.5 x10   Modalities   Modalities Moist Heat   Moist Heat Therapy   Number Minutes  Moist Heat 10 Minutes   Moist Heat Location Cervical;Lumbar Spine                  PT Short Term Goals - 09/23/15 1017    PT SHORT TERM GOAL #1   Title independent with initial HEP   Status Achieved           PT Long Term Goals - 09/30/15 1004    PT LONG TERM GOAL #3   Title increase Berg balance score to 43/56   Status Achieved   PT LONG TERM GOAL #4   Title decrease TUG time to 19 seconds   Status Achieved               Plan - 10/30/15 1152    Clinical Impression Statement Pt ~ 10 minutes late for today's session. Pt returns to therapy after three weeks. Pt reports that she has had a few falls. Pt reports tripping over object for two falls. On the third fall pt reports transitioning into a dark room. Pt continues to have trouble negotiating over objects. Pt also with a heavy posterior lean with sanding march, more lean with R than L.    Rehab Potential Good   PT Frequency 2x / week   PT Duration 8 weeks   PT Treatment/Interventions ADLs/Self Care Home Management;Electrical Stimulation;Moist Heat;Therapeutic exercise;Therapeutic activities;Functional mobility training;Gait training;Ultrasound;Traction;Patient/family education;Manual techniques;Passive range of motion   PT Next Visit Plan Posture strengthening and balance.      Patient will benefit from skilled therapeutic intervention in order to improve the following deficits and impairments:  Abnormal gait, Cardiopulmonary status limiting activity, Decreased activity tolerance, Decreased balance, Decreased mobility, Decreased range of motion, Decreased safety awareness, Decreased strength, Difficulty walking, Increased muscle spasms, Improper body mechanics, Postural dysfunction, Pain  Visit Diagnosis: Cervicalgia  Falls frequently  Abnormal posture     Problem List Patient Active Problem List   Diagnosis Date Noted  . Dyspnea 11/23/2013    Scot Jun, PTA 10/30/2015, 12:04 PM  [address] [place], [place], [address] Phone: [phone]   Fax:  [phone]  Name: Alice Hayes MRN: [id number] Date of Birth: Mar 24, 1923

## 2015-11-04 ENCOUNTER — Ambulatory Visit: Payer: Medicare Other | Admitting: Physical Therapy

## 2015-11-04 DIAGNOSIS — S80811A Abrasion, right lower leg, initial encounter: Secondary | ICD-10-CM | POA: Diagnosis not present

## 2015-11-04 DIAGNOSIS — R296 Repeated falls: Secondary | ICD-10-CM | POA: Diagnosis not present

## 2015-11-10 DIAGNOSIS — H353232 Exudative age-related macular degeneration, bilateral, with inactive choroidal neovascularization: Secondary | ICD-10-CM | POA: Diagnosis not present

## 2015-11-15 DIAGNOSIS — R269 Unspecified abnormalities of gait and mobility: Secondary | ICD-10-CM | POA: Diagnosis not present

## 2015-11-15 DIAGNOSIS — R5381 Other malaise: Secondary | ICD-10-CM | POA: Diagnosis not present

## 2015-11-15 DIAGNOSIS — M40209 Unspecified kyphosis, site unspecified: Secondary | ICD-10-CM | POA: Diagnosis not present

## 2015-11-15 DIAGNOSIS — E559 Vitamin D deficiency, unspecified: Secondary | ICD-10-CM | POA: Diagnosis not present

## 2015-11-15 DIAGNOSIS — E039 Hypothyroidism, unspecified: Secondary | ICD-10-CM | POA: Diagnosis not present

## 2015-11-15 DIAGNOSIS — R296 Repeated falls: Secondary | ICD-10-CM | POA: Diagnosis not present

## 2015-11-18 ENCOUNTER — Encounter: Payer: Self-pay | Admitting: Physical Therapy

## 2015-11-18 ENCOUNTER — Ambulatory Visit: Payer: Medicare Other | Attending: Family Medicine | Admitting: Physical Therapy

## 2015-11-18 DIAGNOSIS — R296 Repeated falls: Secondary | ICD-10-CM | POA: Diagnosis not present

## 2015-11-18 DIAGNOSIS — R293 Abnormal posture: Secondary | ICD-10-CM | POA: Diagnosis not present

## 2015-11-18 DIAGNOSIS — M542 Cervicalgia: Secondary | ICD-10-CM | POA: Diagnosis not present

## 2015-11-18 NOTE — Therapy (Signed)
[place] [address] [place], [place], [address] Phone: [phone]   Fax:  [phone]  Physical Therapy Treatment  Patient Details  Name: Alice Hayes MRN: [id number] Date of Birth: 07/28/1922 Referring Provider: Yaakov Guthrie  Encounter Date: 11/18/2015      PT End of Session - 11/18/15 1429    Visit Number 7   PT Start Time 1344   PT Stop Time 1444   PT Time Calculation (min) 60 min   Activity Tolerance Patient tolerated treatment well   Behavior During Therapy [facility] for tasks assessed/performed      Past Medical History  Diagnosis Date  . HTN (hypertension)   . Melanoma ([place])   . CAD (coronary artery disease)     nonobstructive cath 2006  . Cholelithiases   . Osteoporosis   . COPD (chronic obstructive pulmonary disease) ([place])   . Hyperlipidemia   . Osteoarthritis   . Diastolic heart failure [facility])     Past Surgical History  Procedure Laterality Date  . Appendectomy    . Breast lumpectomy    . Tonsillectomy and adenoidectomy    . Parotidectomy      1959  . Bladder suspension      2006  . Carpal tunnel release      2007  . Melanoma excision      2008  . Lymph node biopsy    . Cataract extraction      There were no vitals filed for this visit.      Subjective Assessment - 11/18/15 1346    Subjective "I don't think my balance is getting better"   Currently in Pain? Yes   Pain Score 6    Pain Location Neck                         [facility] Adult PT Treatment/Exercise - 11/18/15 0001    High Level Balance   High Level Balance Activities Backward walking;Side stepping;Marching forwards;Negotiating over obstacles;Negotitating around obstacles   Lumbar Exercises: Aerobic   UBE (Upper Arm Bike) NuStep L4 x6 min    Lumbar Exercises: Standing   Row 15 reps;Theraband   Theraband Level (Row) Level 2 (Red)   Shoulder Extension 15 reps;Theraband   Theraband Level (Shoulder  Extension) Level 2 (Red)   Lumbar Exercises: Seated   Sit to Stand 10 reps   Knee/Hip Exercises: Seated   Long Arc Quad 2 sets;15 reps   Long Arc Quad Weight 2 lbs.   Other Seated Knee/Hip Exercises Marching 1.5 x10   Modalities   Modalities Moist Heat   Moist Heat Therapy   Number Minutes Moist Heat 15 Minutes   Moist Heat Location Cervical;Lumbar Spine   Electrical Stimulation   Electrical Stimulation Location cervical   Electrical Stimulation Action Pre Mod   Electrical Stimulation Parameters supine   Electrical Stimulation Goals Pain                  PT Short Term Goals - 09/23/15 1017    PT SHORT TERM GOAL #1   Title independent with initial HEP   Status Achieved           PT Long Term Goals - 09/30/15 1004    PT LONG TERM GOAL #3   Title increase Berg balance score to 43/56   Status Achieved   PT LONG TERM GOAL #4   Title decrease TUG time to 19 seconds  Status Achieved               Plan - 11/18/15 1430    Clinical Impression Statement majority of today's treatment focused on standing balance and mobility. Pt hs difficulty negotiation over and around obstacles, LOB x1 but able to catch herself. Decrease activity tolerance and needs frequent rest breaks. All other interventions performed well.   Rehab Potential Good   PT Frequency 2x / week   PT Duration 8 weeks   PT Next Visit Plan Posture strengthening and balance.      Patient will benefit from skilled therapeutic intervention in order to improve the following deficits and impairments:  Abnormal gait, Cardiopulmonary status limiting activity, Decreased activity tolerance, Decreased balance, Decreased mobility, Decreased range of motion, Decreased safety awareness, Decreased strength, Difficulty walking, Increased muscle spasms, Improper body mechanics, Postural dysfunction, Pain  Visit Diagnosis: Cervicalgia  Falls frequently  Abnormal posture     Problem List Patient Active  Problem List   Diagnosis Date Noted  . Dyspnea 11/23/2013    Scot Jun, PTA  11/18/2015, 2:36 PM  [place] [address] [place], [place], [address] Phone: [phone]   Fax:  [phone]  Name: Alice Hayes MRN: [id number] Date of Birth: [birth year]

## 2015-11-18 NOTE — Addendum Note (Signed)
Addended by: Sumner Boast on: 11/18/2015 05:08 PM   Modules accepted: Orders

## 2015-11-20 DIAGNOSIS — Z961 Presence of intraocular lens: Secondary | ICD-10-CM | POA: Diagnosis not present

## 2015-11-20 DIAGNOSIS — H353232 Exudative age-related macular degeneration, bilateral, with inactive choroidal neovascularization: Secondary | ICD-10-CM | POA: Diagnosis not present

## 2015-11-21 DIAGNOSIS — L603 Nail dystrophy: Secondary | ICD-10-CM | POA: Diagnosis not present

## 2015-11-21 DIAGNOSIS — I739 Peripheral vascular disease, unspecified: Secondary | ICD-10-CM | POA: Diagnosis not present

## 2015-11-21 DIAGNOSIS — L84 Corns and callosities: Secondary | ICD-10-CM | POA: Diagnosis not present

## 2015-11-25 ENCOUNTER — Ambulatory Visit: Payer: Medicare Other | Admitting: Physical Therapy

## 2015-11-25 ENCOUNTER — Encounter: Payer: Self-pay | Admitting: Physical Therapy

## 2015-11-25 DIAGNOSIS — R296 Repeated falls: Secondary | ICD-10-CM

## 2015-11-25 DIAGNOSIS — M542 Cervicalgia: Secondary | ICD-10-CM | POA: Diagnosis not present

## 2015-11-25 DIAGNOSIS — R293 Abnormal posture: Secondary | ICD-10-CM

## 2015-11-25 NOTE — Therapy (Signed)
[place] [address] [place], [place], [address] Phone: [phone]   Fax:  [phone]  Physical Therapy Treatment  Patient Details  Name: Alice Hayes MRN: [id number] Date of Birth: 02-28-1923 Referring Provider: Yaakov Guthrie  Encounter Date: 11/25/2015      PT End of Session - 11/25/15 1142    Visit Number 8   Date for PT Re-Evaluation 12/16/15   PT Start Time 1059   PT Stop Time 1156   PT Time Calculation (min) 57 min   Activity Tolerance Patient tolerated treatment well   Behavior During Therapy [facility] for tasks assessed/performed      Past Medical History  Diagnosis Date  . HTN (hypertension)   . Melanoma ([place])   . CAD (coronary artery disease)     nonobstructive cath 2006  . Cholelithiases   . Osteoporosis   . COPD (chronic obstructive pulmonary disease) ([place])   . Hyperlipidemia   . Osteoarthritis   . Diastolic heart failure [facility])     Past Surgical History  Procedure Laterality Date  . Appendectomy    . Breast lumpectomy    . Tonsillectomy and adenoidectomy    . Parotidectomy      1959  . Bladder suspension      2006  . Carpal tunnel release      2007  . Melanoma excision      2008  . Lymph node biopsy    . Cataract extraction      There were no vitals filed for this visit.      Subjective Assessment - 11/25/15 1058    Subjective "I think Im doing better, I want to go walking by myself, but Im afraid I will loose my balance  "   Currently in Pain? Yes   Pain Score 5    Pain Location Neck                         [facility] Adult PT Treatment/Exercise - 11/25/15 0001    High Level Balance   High Level Balance Activities Marching forwards;Side stepping;Negotiating over obstacles   High Level Balance Comments Standing on airex small march x10, on aires with forward reaches.   Lumbar Exercises: Aerobic   UBE (Upper Arm Bike) NuStep L4 x6 min    Lumbar Exercises:  Machines for Strengthening   Other Lumbar Machine Exercise Rows & Lats 15l 2x10   Lumbar Exercises: Standing   Other Standing Lumbar Exercises Standinf forward reaches with yellow ball 2x10   Knee/Hip Exercises: Standing   Forward Step Up 2 sets;10 reps;Both;Hand Hold: 2;Step Height: 6"   Modalities   Modalities Moist Heat   Moist Heat Therapy   Number Minutes Moist Heat 15 Minutes   Moist Heat Location Cervical;Lumbar Spine   Electrical Stimulation   Electrical Stimulation Location cervical   Electrical Stimulation Action IFC   Electrical Stimulation Parameters supine   Electrical Stimulation Goals Pain                  PT Short Term Goals - 09/23/15 1017    PT SHORT TERM GOAL #1   Title independent with initial HEP   Status Achieved           PT Long Term Goals - 09/30/15 1004    PT LONG TERM GOAL #3   Title increase Berg balance score to 43/56   Status Achieved   PT LONG TERM  GOAL #4   Title decrease TUG time to 19 seconds   Status Achieved               Plan - 11/25/15 1142    Clinical Impression Statement Pt completed all exercises this date, difficulty negotiation over obstacles and when standing on no compliant surfaces. Pt hesitant with step up relying on HHA x2 to complete.  Pt unwilling to attempt step ups without HHA.   Rehab Potential Good   PT Frequency 2x / week   PT Duration 8 weeks   PT Treatment/Interventions ADLs/Self Care Home Management;Electrical Stimulation;Moist Heat;Therapeutic exercise;Therapeutic activities;Functional mobility training;Gait training;Ultrasound;Traction;Patient/family education;Manual techniques;Passive range of motion   PT Next Visit Plan Posture strengthening and balance.      Patient will benefit from skilled therapeutic intervention in order to improve the following deficits and impairments:  Abnormal gait, Cardiopulmonary status limiting activity, Decreased activity tolerance, Decreased balance, Decreased  mobility, Decreased range of motion, Decreased safety awareness, Decreased strength, Difficulty walking, Increased muscle spasms, Improper body mechanics, Postural dysfunction, Pain  Visit Diagnosis: Cervicalgia  Falls frequently  Abnormal posture     Problem List Patient Active Problem List   Diagnosis Date Noted  . Dyspnea 11/23/2013    Scot Jun, PTA  11/25/2015, 11:46 AM  [address] [place], [place], [address] Phone: [phone]   Fax:  [phone]  Name: ALANDRIA ROUSEY MRN: [id number] Date of Birth: [birth year]

## 2015-12-02 ENCOUNTER — Ambulatory Visit: Payer: Medicare Other | Attending: Family Medicine | Admitting: Physical Therapy

## 2015-12-02 ENCOUNTER — Encounter: Payer: Self-pay | Admitting: Physical Therapy

## 2015-12-02 DIAGNOSIS — R296 Repeated falls: Secondary | ICD-10-CM | POA: Diagnosis not present

## 2015-12-02 DIAGNOSIS — R293 Abnormal posture: Secondary | ICD-10-CM | POA: Diagnosis not present

## 2015-12-02 DIAGNOSIS — M542 Cervicalgia: Secondary | ICD-10-CM | POA: Insufficient documentation

## 2015-12-02 NOTE — Therapy (Signed)
[place], [place], [address] Phone: [phone]   Fax:  [phone]  Physical Therapy Treatment  Patient Details  Name: Alice Hayes MRN: [id number] Date of Birth: May 13, 1923 Referring Provider: Yaakov Guthrie  Encounter Date: 12/02/2015      PT End of Session - 12/02/15 1137    Visit Number 9   Date for PT Re-Evaluation 12/16/15   PT Start Time 1102   PT Stop Time 1200   PT Time Calculation (min) 58 min      Past Medical History  Diagnosis Date  . HTN (hypertension)   . Melanoma ([place])   . CAD (coronary artery disease)     nonobstructive cath 2006  . Cholelithiases   . Osteoporosis   . COPD (chronic obstructive pulmonary disease) ([place])   . Hyperlipidemia   . Osteoarthritis   . Diastolic heart failure [facility])     Past Surgical History  Procedure Laterality Date  . Appendectomy    . Breast lumpectomy    . Tonsillectomy and adenoidectomy    . Parotidectomy      1959  . Bladder suspension      2006  . Carpal tunnel release      2007  . Melanoma excision      2008  . Lymph node biopsy    . Cataract extraction      There were no vitals filed for this visit.      Subjective Assessment - 12/02/15 1102    Subjective "I think Im getting better, your exercises are really helping me."   Currently in Pain? No/denies   Pain Score 0-No pain                         [facility] Adult PT Treatment/Exercise - 12/02/15 0001    High Level Balance   High Level Balance Activities Marching forwards;Side stepping;Negotiating over obstacles;Negotitating around obstacles   High Level Balance Comments Standing on airex 10sec x3, Standing on airex small marches x20   Lumbar Exercises: Aerobic   UBE (Upper Arm Bike) NuStep L4 x6 min    Lumbar Exercises: Machines for Strengthening   Other Lumbar Machine Exercise Rows & Lats 20 2x10   Modalities   Modalities Moist Heat   Moist  Heat Therapy   Number Minutes Moist Heat 15 Minutes   Moist Heat Location Cervical;Lumbar Spine   Electrical Stimulation   Electrical Stimulation Location cervical   Electrical Stimulation Action IFC   Electrical Stimulation Parameters pt tolerance   Electrical Stimulation Goals Pain                  PT Short Term Goals - 09/23/15 1017    PT SHORT TERM GOAL #1   Title independent with initial HEP   Status Achieved           PT Long Term Goals - 12/02/15 1108    PT LONG TERM GOAL #1   Title independent with advance HEP to include balance    Status Achieved   PT LONG TERM GOAL #2   Title decrease pain in the right shoulder 50%    Status Partially Met   PT LONG TERM GOAL #5   Title no falls over a 4 week period   Status Partially Met               Plan - 12/02/15 1138    Clinical Impression  Statement Pt remains challenges with balance interventions. Pt had a difficult time stepping over objects, but pt demos a better ability to walk over objects alternating LE. continues to have a posterior lean when standing on airex pad. Pt tents to look down when standing, looking up causes and increase in posterior lean while standing on non compliant surfaces.   Rehab Potential Good   PT Frequency 2x / week   PT Duration 8 weeks   PT Treatment/Interventions ADLs/Self Care Home Management;Electrical Stimulation;Moist Heat;Therapeutic exercise;Therapeutic activities;Functional mobility training;Gait training;Ultrasound;Traction;Patient/family education;Manual techniques;Passive range of motion   PT Next Visit Plan Posture strengthening and balance.      Patient will benefit from skilled therapeutic intervention in order to improve the following deficits and impairments:  Abnormal gait, Cardiopulmonary status limiting activity, Decreased activity tolerance, Decreased balance, Decreased mobility, Decreased range of motion, Decreased safety awareness, Decreased strength,  Difficulty walking, Increased muscle spasms, Improper body mechanics, Postural dysfunction, Pain  Visit Diagnosis: Cervicalgia  Falls frequently  Abnormal posture     Problem List Patient Active Problem List   Diagnosis Date Noted  . Dyspnea 11/23/2013    Scot Jun, PTA 12/02/2015, 11:47 AM  [address] [place], [place], [address] Phone: [phone]   Fax:  [phone]  Name: Alice Hayes MRN: [id number] Date of Birth: Oct 27, 1922

## 2015-12-11 ENCOUNTER — Ambulatory Visit: Payer: Medicare Other | Admitting: Physical Therapy

## 2015-12-11 ENCOUNTER — Encounter: Payer: Self-pay | Admitting: Physical Therapy

## 2015-12-11 DIAGNOSIS — R293 Abnormal posture: Secondary | ICD-10-CM

## 2015-12-11 DIAGNOSIS — R296 Repeated falls: Secondary | ICD-10-CM | POA: Diagnosis not present

## 2015-12-11 DIAGNOSIS — M542 Cervicalgia: Secondary | ICD-10-CM

## 2015-12-11 NOTE — Therapy (Signed)
[place], [place], [address] Phone: [phone]   Fax:  [phone]  Physical Therapy Treatment  Patient Details  Name: Alice Hayes MRN: [id number] Date of Birth: [birth year] Referring Provider: Yaakov Guthrie  Encounter Date: 12/11/2015      PT End of Session - 12/11/15 1145    Visit Number 10   Date for PT Re-Evaluation 12/16/15   PT Start Time 1100   PT Stop Time 1159   PT Time Calculation (min) 59 min   Activity Tolerance Patient tolerated treatment well   Behavior During Therapy [facility] for tasks assessed/performed      Past Medical History  Diagnosis Date  . HTN (hypertension)   . Melanoma ([place])   . CAD (coronary artery disease)     nonobstructive cath 2006  . Cholelithiases   . Osteoporosis   . COPD (chronic obstructive pulmonary disease) ([place])   . Hyperlipidemia   . Osteoarthritis   . Diastolic heart failure [facility])     Past Surgical History  Procedure Laterality Date  . Appendectomy    . Breast lumpectomy    . Tonsillectomy and adenoidectomy    . Parotidectomy      1959  . Bladder suspension      2006  . Carpal tunnel release      2007  . Melanoma excision      2008  . Lymph node biopsy    . Cataract extraction      There were no vitals filed for this visit.      Subjective Assessment - 12/11/15 1105    Subjective Pt reports that she has been having trouble with her vision recently. Pt stated that she is seeing colors that are not there.   Currently in Pain? Yes   Pain Score 6    Pain Location Neck                         [facility] Adult PT Treatment/Exercise - 12/11/15 0001    High Level Balance   High Level Balance Activities Side stepping;Backward walking;Tandem walking;Marching forwards   Lumbar Exercises: Aerobic   UBE (Upper Arm Bike) NuStep L4 x6 min    Lumbar Exercises: Machines for Strengthening   Cybex Knee Extension 5lb 2x10   Cybex Knee Flexion 20lb 2x10   Other Lumbar Machine Exercise Rows & Lats 20 2x10   Lumbar Exercises: Standing   Other Standing Lumbar Exercises Forward steps ups 4in step 2x10   Modalities   Modalities Moist Heat   Moist Heat Therapy   Number Minutes Moist Heat 15 Minutes   Moist Heat Location Cervical;Lumbar Spine   Electrical Stimulation   Electrical Stimulation Location cervical   Electrical Stimulation Action IFC   Electrical Stimulation Parameters pt tolerance    Electrical Stimulation Goals Pain                  PT Short Term Goals - 09/23/15 1017    PT SHORT TERM GOAL #1   Title independent with initial HEP   Status Achieved           PT Long Term Goals - 12/02/15 1108    PT LONG TERM GOAL #1   Title independent with advance HEP to include balance    Status Achieved   PT LONG TERM GOAL #2   Title decrease pain in the right shoulder 50%    Status Partially  Met   PT LONG TERM GOAL #5   Title no falls over a 4 week period   Status Partially Met               Plan - 12/11/15 1145    Clinical Impression Statement Pt enters clinic reporting vision issues so negotiating over objects avoided. Completed all machine level interventions well. Pt does have increase difficulty with tandem walking. pt also with frequent LOB with step ups due to foot clarence issues.   Rehab Potential Good   PT Frequency 2x / week   PT Duration 8 weeks   PT Treatment/Interventions ADLs/Self Care Home Management;Electrical Stimulation;Moist Heat;Therapeutic exercise;Therapeutic activities;Functional mobility training;Gait training;Ultrasound;Traction;Patient/family education;Manual techniques;Passive range of motion   PT Next Visit Plan check goals.      Patient will benefit from skilled therapeutic intervention in order to improve the following deficits and impairments:  Abnormal gait, Cardiopulmonary status limiting activity, Decreased activity tolerance, Decreased balance,  Decreased mobility, Decreased range of motion, Decreased safety awareness, Decreased strength, Difficulty walking, Increased muscle spasms, Improper body mechanics, Postural dysfunction, Pain  Visit Diagnosis: Cervicalgia  Falls frequently  Abnormal posture     Problem List Patient Active Problem List   Diagnosis Date Noted  . Dyspnea 11/23/2013    Scot Jun, PTA  12/11/2015, 11:59 AM  [address] [place] [address] [place], [place], [address] Phone: [phone]   Fax:  [phone]  Name: Alice Hayes MRN: [id number] Date of Birth: 1922/09/13

## 2015-12-18 ENCOUNTER — Encounter: Payer: Self-pay | Admitting: Physical Therapy

## 2015-12-18 ENCOUNTER — Ambulatory Visit: Payer: Medicare Other | Admitting: Physical Therapy

## 2015-12-18 DIAGNOSIS — R296 Repeated falls: Secondary | ICD-10-CM | POA: Diagnosis not present

## 2015-12-18 DIAGNOSIS — R293 Abnormal posture: Secondary | ICD-10-CM | POA: Diagnosis not present

## 2015-12-18 DIAGNOSIS — M542 Cervicalgia: Secondary | ICD-10-CM | POA: Diagnosis not present

## 2015-12-18 NOTE — Therapy (Signed)
[place], [place], [address] Phone: [phone]   Fax:  [phone]  Physical Therapy Treatment  Patient Details  Name: Alice Hayes MRN: [id number] Date of Birth: [birth year] Referring Provider: Yaakov Guthrie  Encounter Date: 12/18/2015      PT End of Session - 12/18/15 1012    Visit Number 11   Date for PT Re-Evaluation 12/16/15   PT Start Time 0930   PT Stop Time 1027   PT Time Calculation (min) 57 min   Activity Tolerance Patient tolerated treatment well   Behavior During Therapy [facility] for tasks assessed/performed      Past Medical History  Diagnosis Date  . HTN (hypertension)   . Melanoma ([place])   . CAD (coronary artery disease)     nonobstructive cath 2006  . Cholelithiases   . Osteoporosis   . COPD (chronic obstructive pulmonary disease) ([place])   . Hyperlipidemia   . Osteoarthritis   . Diastolic heart failure [facility])     Past Surgical History  Procedure Laterality Date  . Appendectomy    . Breast lumpectomy    . Tonsillectomy and adenoidectomy    . Parotidectomy      1959  . Bladder suspension      2006  . Carpal tunnel release      2007  . Melanoma excision      2008  . Lymph node biopsy    . Cataract extraction      There were no vitals filed for this visit.      Subjective Assessment - 12/18/15 0931    Subjective "Its been  busy, I went up to [place]" "I think my balance is better, I feel good considering my age."   Currently in Pain? Yes   Pain Score 6    Pain Location Neck   Pain Orientation Mid                         [facility] Adult PT Treatment/Exercise - 12/18/15 0001    High Level Balance   High Level Balance Activities Side stepping;Backward walking;Tandem walking;Marching forwards   Lumbar Exercises: Aerobic   UBE (Upper Arm Bike) NuStep L4 x7 min    Lumbar Exercises: Machines for Strengthening   Cybex Knee Extension 5lb 2x15    Cybex Knee Flexion 20lb 2x15   Other Lumbar Machine Exercise Rows & Lats 20 2x10   Lumbar Exercises: Seated   Other Seated Lumbar Exercises Seated overhead lift with yellow ball 2x10; seated trunk perturbations 2x10   Modalities   Modalities Moist Heat   Moist Heat Therapy   Number Minutes Moist Heat 15 Minutes   Moist Heat Location Cervical;Lumbar Spine   Electrical Stimulation   Electrical Stimulation Location cervical   Electrical Stimulation Action IFC   Electrical Stimulation Parameters pt tolerance    Electrical Stimulation Goals Pain                  PT Short Term Goals - 09/23/15 1017    PT SHORT TERM GOAL #1   Title independent with initial HEP   Status Achieved           PT Long Term Goals - 12/18/15 0935    PT LONG TERM GOAL #1   Title independent with advance HEP to include balance    Status Achieved   PT LONG TERM GOAL #3   Title increase Berg balance  score to 43/56   Status Achieved   PT LONG TERM GOAL #4   Status Achieved   PT LONG TERM GOAL #5   Title no falls over a 4 week period   Status Achieved               Plan - 12/18/15 1013    Clinical Impression Statement Pt has progressed and completed most goals, pt is pleased with her current functional status. No issues with today's interventions, pt expressed that she does not like tandem walking but performed well.   PT Frequency 2x / week   PT Duration 8 weeks   PT Treatment/Interventions ADLs/Self Care Home Management;Electrical Stimulation;Moist Heat;Therapeutic exercise;Therapeutic activities;Functional mobility training;Gait training;Ultrasound;Traction;Patient/family education;Manual techniques;Passive range of motion   PT Next Visit Plan D/C PT      Patient will benefit from skilled therapeutic intervention in order to improve the following deficits and impairments:  Abnormal gait, Cardiopulmonary status limiting activity, Decreased activity tolerance, Decreased balance,  Decreased mobility, Decreased range of motion, Decreased safety awareness, Decreased strength, Difficulty walking, Increased muscle spasms, Improper body mechanics, Postural dysfunction, Pain  Visit Diagnosis: Cervicalgia  Falls frequently  Abnormal posture     Problem List Patient Active Problem List   Diagnosis Date Noted  . Dyspnea 11/23/2013    PHYSICAL THERAPY DISCHARGE SUMMARY  Visits from Start of Care: 11   Plan: Patient agrees to discharge.  Patient goals were partially met. Patient is being discharged due to being pleased with the current functional level.  ?????       Scot Jun, PTA  12/18/2015, 10:18 AM  [place], [place], [address] Phone: [phone]   Fax:  [phone]  Name: Alice Hayes MRN: [id number] Date of Birth: 1922/10/16

## 2015-12-31 DIAGNOSIS — G629 Polyneuropathy, unspecified: Secondary | ICD-10-CM | POA: Diagnosis not present

## 2015-12-31 DIAGNOSIS — E7800 Pure hypercholesterolemia, unspecified: Secondary | ICD-10-CM | POA: Diagnosis not present

## 2015-12-31 DIAGNOSIS — E039 Hypothyroidism, unspecified: Secondary | ICD-10-CM | POA: Diagnosis not present

## 2015-12-31 DIAGNOSIS — M810 Age-related osteoporosis without current pathological fracture: Secondary | ICD-10-CM | POA: Diagnosis not present

## 2015-12-31 DIAGNOSIS — I10 Essential (primary) hypertension: Secondary | ICD-10-CM | POA: Diagnosis not present

## 2015-12-31 DIAGNOSIS — E0590 Thyrotoxicosis, unspecified without thyrotoxic crisis or storm: Secondary | ICD-10-CM | POA: Diagnosis not present

## 2015-12-31 DIAGNOSIS — M159 Polyosteoarthritis, unspecified: Secondary | ICD-10-CM | POA: Diagnosis not present

## 2016-01-02 DIAGNOSIS — E559 Vitamin D deficiency, unspecified: Secondary | ICD-10-CM | POA: Diagnosis not present

## 2016-01-02 DIAGNOSIS — R269 Unspecified abnormalities of gait and mobility: Secondary | ICD-10-CM | POA: Diagnosis not present

## 2016-01-02 DIAGNOSIS — R5381 Other malaise: Secondary | ICD-10-CM | POA: Diagnosis not present

## 2016-01-02 DIAGNOSIS — R296 Repeated falls: Secondary | ICD-10-CM | POA: Diagnosis not present

## 2016-01-02 DIAGNOSIS — M40209 Unspecified kyphosis, site unspecified: Secondary | ICD-10-CM | POA: Diagnosis not present

## 2016-01-02 DIAGNOSIS — E039 Hypothyroidism, unspecified: Secondary | ICD-10-CM | POA: Diagnosis not present

## 2016-01-16 DIAGNOSIS — H43813 Vitreous degeneration, bilateral: Secondary | ICD-10-CM | POA: Diagnosis not present

## 2016-01-16 DIAGNOSIS — H35423 Microcystoid degeneration of retina, bilateral: Secondary | ICD-10-CM | POA: Diagnosis not present

## 2016-01-16 DIAGNOSIS — H353232 Exudative age-related macular degeneration, bilateral, with inactive choroidal neovascularization: Secondary | ICD-10-CM | POA: Diagnosis not present

## 2016-02-05 DIAGNOSIS — D239 Other benign neoplasm of skin, unspecified: Secondary | ICD-10-CM | POA: Diagnosis not present

## 2016-02-05 DIAGNOSIS — I872 Venous insufficiency (chronic) (peripheral): Secondary | ICD-10-CM | POA: Diagnosis not present

## 2016-02-14 DIAGNOSIS — L603 Nail dystrophy: Secondary | ICD-10-CM | POA: Diagnosis not present

## 2016-02-14 DIAGNOSIS — I739 Peripheral vascular disease, unspecified: Secondary | ICD-10-CM | POA: Diagnosis not present

## 2016-02-14 DIAGNOSIS — L84 Corns and callosities: Secondary | ICD-10-CM | POA: Diagnosis not present

## 2016-03-19 DIAGNOSIS — Z23 Encounter for immunization: Secondary | ICD-10-CM | POA: Diagnosis not present

## 2016-04-03 DIAGNOSIS — R296 Repeated falls: Secondary | ICD-10-CM | POA: Diagnosis not present

## 2016-04-03 DIAGNOSIS — E039 Hypothyroidism, unspecified: Secondary | ICD-10-CM | POA: Diagnosis not present

## 2016-04-03 DIAGNOSIS — R5381 Other malaise: Secondary | ICD-10-CM | POA: Diagnosis not present

## 2016-04-03 DIAGNOSIS — R269 Unspecified abnormalities of gait and mobility: Secondary | ICD-10-CM | POA: Diagnosis not present

## 2016-04-03 DIAGNOSIS — E559 Vitamin D deficiency, unspecified: Secondary | ICD-10-CM | POA: Diagnosis not present

## 2016-04-03 DIAGNOSIS — M40209 Unspecified kyphosis, site unspecified: Secondary | ICD-10-CM | POA: Diagnosis not present

## 2016-04-30 DIAGNOSIS — S81819A Laceration without foreign body, unspecified lower leg, initial encounter: Secondary | ICD-10-CM | POA: Diagnosis not present

## 2016-04-30 DIAGNOSIS — S8991XA Unspecified injury of right lower leg, initial encounter: Secondary | ICD-10-CM | POA: Diagnosis not present

## 2016-05-07 ENCOUNTER — Other Ambulatory Visit: Payer: Self-pay | Admitting: Physician Assistant

## 2016-05-07 ENCOUNTER — Ambulatory Visit
Admission: RE | Admit: 2016-05-07 | Discharge: 2016-05-07 | Disposition: A | Discharge status: Home / Self Care | Payer: Medicare Other | Source: Ambulatory Visit | Attending: Physician Assistant | Admitting: Physician Assistant

## 2016-05-07 DIAGNOSIS — S8991XA Unspecified injury of right lower leg, initial encounter: Secondary | ICD-10-CM

## 2016-05-07 DIAGNOSIS — M7989 Other specified soft tissue disorders: Secondary | ICD-10-CM | POA: Diagnosis not present

## 2016-06-09 DIAGNOSIS — E559 Vitamin D deficiency, unspecified: Secondary | ICD-10-CM | POA: Diagnosis not present

## 2016-06-09 DIAGNOSIS — R269 Unspecified abnormalities of gait and mobility: Secondary | ICD-10-CM | POA: Diagnosis not present

## 2016-06-09 DIAGNOSIS — Z5181 Encounter for therapeutic drug level monitoring: Secondary | ICD-10-CM | POA: Diagnosis not present

## 2016-06-09 DIAGNOSIS — M40209 Unspecified kyphosis, site unspecified: Secondary | ICD-10-CM | POA: Diagnosis not present

## 2016-06-09 DIAGNOSIS — R5381 Other malaise: Secondary | ICD-10-CM | POA: Diagnosis not present

## 2016-06-09 DIAGNOSIS — L989 Disorder of the skin and subcutaneous tissue, unspecified: Secondary | ICD-10-CM | POA: Diagnosis not present

## 2016-06-09 DIAGNOSIS — E039 Hypothyroidism, unspecified: Secondary | ICD-10-CM | POA: Diagnosis not present

## 2016-06-16 ENCOUNTER — Ambulatory Visit (INDEPENDENT_AMBULATORY_CARE_PROVIDER_SITE_OTHER): Payer: Medicare Other | Admitting: Sports Medicine

## 2016-06-16 ENCOUNTER — Encounter: Payer: Self-pay | Admitting: Sports Medicine

## 2016-06-16 DIAGNOSIS — M2042 Other hammer toe(s) (acquired), left foot: Secondary | ICD-10-CM | POA: Diagnosis not present

## 2016-06-16 DIAGNOSIS — M21619 Bunion of unspecified foot: Secondary | ICD-10-CM

## 2016-06-16 DIAGNOSIS — L84 Corns and callosities: Secondary | ICD-10-CM

## 2016-06-16 NOTE — Progress Notes (Signed)
Subjective: Alice Hayes is a 83 y.o. female patient who presents to office for evaluation of Left foot pain secondary to callus skin at 2nd toe. Patient complains of pain at the lesion present Left foot at the 2nd toe worse over last month. Patient has tried bandage with no relief in symptoms. Patient denies any other pedal complaints.   Patient Active Problem List   Diagnosis Date Noted  . Dyspnea 11/23/2013    Current Outpatient Prescriptions on File Prior to Visit  Medication Sig Dispense Refill  . gabapentin (NEURONTIN) 600 MG tablet Take 600 mg by mouth 2 (two) times daily.    Marland Kitchen loratadine (CLARITIN) 10 MG tablet Take 10 mg by mouth daily.    . meclizine (ANTIVERT) 25 MG tablet Take 25 mg by mouth 3 (three) times daily as needed for dizziness.    . Multiple Vitamin (MULTIVITAMIN WITH MINERALS) TABS tablet Take 1 tablet by mouth daily.    . Multiple Vitamins-Minerals (ICAPS) CAPS Take 1 capsule by mouth daily.     Marland Kitchen omega-3 acid ethyl esters (LOVAZA) 1 G capsule Take 1 g by mouth daily.     Marland Kitchen oxybutynin (DITROPAN) 5 MG tablet Take 5 mg by mouth daily.     Marland Kitchen thyroid (ARMOUR THYROID) 60 MG tablet Take 90 mg by mouth daily before breakfast.      No current facility-administered medications on file prior to visit.     Allergies  Allergen Reactions  . Voltaren Ophthalmic [Diclofenac]     Objective:  General: Alert and oriented x3 in no acute distress  Dermatology: Mild Keratotic lesion present at 2nd dorsal PIPJ left with skin lines transversing the lesion, pain is present with direct pressure to the lesion with a minimal central nucleated core noted, no webspace macerations, no ecchymosis bilateral, all nails x 10 are well manicured.  Vascular: Dorsalis Pedis and Posterior Tibial pedal pulses 1/4, Capillary Fill Time 3 seconds, Scant pedal hair growth bilateral, no edema bilateral lower extremities, Temperature gradient within normal limits.  Neurology: Gross sensation intact  via light touch bilateral.  Musculoskeletal: Minimal tenderness with palpation at the keratotic lesion site on Left 2nd toe, Muscular strength 5/5 in all groups without pain or limitation on range of motion.Severe endstage bunion and hammertoe with dorsal dislocation left 2nd most dislocated.   Assessment and Plan: Problem List Items Addressed This Visit    None    Visit Diagnoses    Corns and callosities    -  Primary   Bunion       Hammer toe of left foot         -Complete examination performed -Discussed treatment options -Very minimal callus to pare at this visit thus dispensed digital pad -Encouraged daily skin emollients -Advised good supportive shoes and inserts -Patient to return to office as needed or sooner if condition worsens.  Landis Martins, DPM

## 2016-06-16 NOTE — Patient Instructions (Signed)
Hammer Toe Hammer toe is a change in the shape (a deformity) of your second, third, or fourth toe. The deformity causes the middle joint of your toe to stay bent. This causes pain, especially when you are wearing shoes. Hammer toe starts gradually. At first, the toe can be straightened. Gradually over time, the deformity becomes stiff and permanent. Early treatments to keep the toe straight may relieve pain. As the deformity becomes stiff and permanent, surgery may be needed to straighten the toe. What are the causes? Hammer toe is caused by abnormal bending of the toe joint that is closest to your foot. It happens gradually over time. This pulls on the muscles and connections (tendons) of the toe joint, making them weak and stiff. It is often related to wearing shoes that are too short or narrow and do not let your toes straighten. What increases the risk? You may be at greater risk for hammer toe if you:  Are female.  Are older.  Wear shoes that are too small.  Wear high-heeled shoes that pinch your toes.  Are a ballet dancer.  Have a second toe that is longer than your big toe (first toe).  Injure your foot or toe.  Have arthritis.  Have a family history of hammer toe.  Have a nerve or muscle disorder. What are the signs or symptoms? The main symptoms of this condition are pain and deformity of the toe. The pain is worse when wearing shoes, walking, or running. Other symptoms may include:  Corns or calluses over the bent part of the toe or between the toes.  Redness and a burning feeling on the toe.  An open sore that forms on the top of the toe.  Not being able to straighten the toe. How is this diagnosed? This condition is diagnosed based on your symptoms and a physical exam. During the exam, your health care provider will try to straighten your toe to see how stiff the deformity is. You may also have tests, such as:  A blood test to check for rheumatoid arthritis.  An  X-ray to show how severe the deformity is. How is this treated? Treatment for this condition will depend on how stiff the deformity is. Surgery is often needed. However, sometimes a hammer toe can be straightened without surgery. Treatments that do not involve surgery include:  Taping the toe into a straightened position.  Using pads and cushions to protect the toe (orthotics).  Wearing shoes that provide enough room for the toes.  Doing toe-stretching exercises at home.  Taking an NSAID to reduce pain and swelling. If these treatments do not help or the toe cannot be straightened, surgery is the next option. The most common surgeries used to straighten a hammer toe include:  Arthroplasty. In this procedure, part of the joint is removed, and that allows the toe to straighten.  Fusion. In this procedure, cartilage between the two bones of the joint is taken out and the bones are fused together into one longer bone.  Implantation. In this procedure, part of the bone is removed and replaced with an implant to let the toe move again.  Flexor tendon transfer. In this procedure, the tendons that curl the toes down (flexor tendons) are repositioned. Follow these instructions at home:  Take over-the-counter and prescription medicines only as told by your health care provider.  Do toe straightening and stretching exercises as told by your health care provider.  Keep all follow-up visits as told by   your health care provider. This is important. How is this prevented?  Wear shoes that give your toes enough room and do not cause pain.  Do not wear high-heeled shoes. Contact a health care provider if:  Your pain gets worse.  Your toe becomes red or swollen.  You develop an open sore on your toe. This information is not intended to replace advice given to you by your health care provider. Make sure you discuss any questions you have with your health care provider. Document Released:  05/15/2000 Document Revised: 12/06/2015 Document Reviewed: 09/11/2015 Elsevier Interactive Patient Education  2017 Elsevier Inc.  

## 2016-07-06 ENCOUNTER — Ambulatory Visit (INDEPENDENT_AMBULATORY_CARE_PROVIDER_SITE_OTHER): Payer: Medicare Other | Admitting: Podiatry

## 2016-07-06 ENCOUNTER — Encounter: Payer: Self-pay | Admitting: Podiatry

## 2016-07-06 DIAGNOSIS — L608 Other nail disorders: Secondary | ICD-10-CM | POA: Diagnosis not present

## 2016-07-06 DIAGNOSIS — I83025 Varicose veins of left lower extremity with ulcer other part of foot: Secondary | ICD-10-CM | POA: Diagnosis not present

## 2016-07-06 DIAGNOSIS — L603 Nail dystrophy: Secondary | ICD-10-CM | POA: Diagnosis not present

## 2016-07-06 DIAGNOSIS — B351 Tinea unguium: Secondary | ICD-10-CM

## 2016-07-06 DIAGNOSIS — L97522 Non-pressure chronic ulcer of other part of left foot with fat layer exposed: Secondary | ICD-10-CM | POA: Diagnosis not present

## 2016-07-06 DIAGNOSIS — M79609 Pain in unspecified limb: Secondary | ICD-10-CM | POA: Diagnosis not present

## 2016-07-06 DIAGNOSIS — L97529 Non-pressure chronic ulcer of other part of left foot with unspecified severity: Secondary | ICD-10-CM

## 2016-07-06 MED ORDER — SULFAMETHOXAZOLE-TRIMETHOPRIM 800-160 MG PO TABS: 1.0000 | ORAL_TABLET | Freq: Two times a day (BID) | ORAL | 0 refills | Status: DC

## 2016-07-10 LAB — WOUND CULTURE
GRAM STAIN: NONE SEEN
GRAM STAIN: NONE SEEN

## 2016-07-12 NOTE — Progress Notes (Signed)
   Subjective:  Patient presents today for evaluation of painful, thickened, elongated nails 1-5 bilateral. Patient also has a painful ulcerative lesion to the second digit left foot. The ulceration began as a callus at the second toe. Patient was last treated by Dr. Cannon Kettle on 06/16/2016. Callus lesion has developed over the past 2 months.   Objective/Physical Exam General: The patient is alert and oriented x3 in no acute distress.  Dermatology:  Wound #1 noted to the plantar aspect of the second digit left foot measuring approximately [phone] cm (LxWxD).   To the noted ulceration(s), there is no eschar. There is a moderate amount of slough, fibrin, and necrotic tissue noted. Granulation tissue and wound base is red. There is a minimal amount of serosanguineous drainage noted. There is no exposed bone muscle-tendon ligament or joint. There is no malodor. Periwound integrity is intact.  Elongated, thickened, hyperkeratotic nails also noted 1-5 bilateral. Skin is warm, dry and supple bilateral lower extremities.  Vascular: Palpable pedal pulses bilaterally. Mild edema noted. Capillary refill within normal limits. Varicosities noted bilateral lower extremities.   Neurological: Epicritic and protective threshold diminished bilaterally.   Musculoskeletal Exam: Range of motion within normal limits to all pedal and ankle joints bilateral. Muscle strength 5/5 in all groups bilateral.   Assessment: #1 ulcer second digit left foot secondary to venous insufficiency #2 varicosities bilateral lower extremities  Plan of Care:  #1 Patient was evaluated. #2 medically necessary excisional debridement including subcutaneous tissue was performed using a tissue nipper and a chisel blade. Excisional debridement of all the necrotic nonviable tissue down to healthy bleeding viable tissue was performed with post-debridement measurements same as pre-. #3 the wound was cleansed with normal saline. #4 dry  sterile dressing applied.  #5 cultures were taken today. Alyson Ingles #6 prescription for Bactrim DS #7 recommend daily Betadine and dry sterile dressing #8 return to clinic in 2 weeks #9 mechanical debridement of nails 1-5 was performed using a nail nipper without incident.Edrick Kins, DPM [facility]  Dr. Edrick Kins, [address]                                        [place], [place] [address]                Office [phone]  Fax [phone]

## 2016-07-16 DIAGNOSIS — H35423 Microcystoid degeneration of retina, bilateral: Secondary | ICD-10-CM | POA: Diagnosis not present

## 2016-07-16 DIAGNOSIS — H43813 Vitreous degeneration, bilateral: Secondary | ICD-10-CM | POA: Diagnosis not present

## 2016-07-16 DIAGNOSIS — H353232 Exudative age-related macular degeneration, bilateral, with inactive choroidal neovascularization: Secondary | ICD-10-CM | POA: Diagnosis not present

## 2016-07-20 ENCOUNTER — Ambulatory Visit (INDEPENDENT_AMBULATORY_CARE_PROVIDER_SITE_OTHER): Payer: Medicare Other | Admitting: Podiatry

## 2016-07-20 DIAGNOSIS — I83025 Varicose veins of left lower extremity with ulcer other part of foot: Secondary | ICD-10-CM | POA: Diagnosis not present

## 2016-07-20 DIAGNOSIS — L97529 Non-pressure chronic ulcer of other part of left foot with unspecified severity: Principal | ICD-10-CM

## 2016-07-20 DIAGNOSIS — L97522 Non-pressure chronic ulcer of other part of left foot with fat layer exposed: Secondary | ICD-10-CM | POA: Diagnosis not present

## 2016-07-27 NOTE — Progress Notes (Signed)
   Subjective:  Patient presents today for follow-up evaluation and treatment of metatarsal ulcer to the second digit left foot. Patient believes of the ulceration is improving. Patient has been changing dressings at home.   Objective/Physical Exam General: The patient is alert and oriented x3 in no acute distress.  Dermatology:  Wound #1 noted to the plantar aspect of the second digit left foot measuring approximately [phone] cm (LxWxD).   To the noted ulceration(s), there is no eschar. There is a moderate amount of slough, fibrin, and necrotic tissue noted. Granulation tissue and wound base is red. There is a minimal amount of serosanguineous drainage noted. There is no exposed bone muscle-tendon ligament or joint. There is no malodor. Periwound integrity is intact.  Elongated, thickened, hyperkeratotic nails also noted 1-5 bilateral. Skin is warm, dry and supple bilateral lower extremities.  Vascular: Palpable pedal pulses bilaterally. Mild edema noted. Capillary refill within normal limits. Varicosities noted bilateral lower extremities.   Neurological: Epicritic and protective threshold diminished bilaterally.   Musculoskeletal Exam: Range of motion within normal limits to all pedal and ankle joints bilateral. Muscle strength 5/5 in all groups bilateral.   Assessment: #1 ulcer second digit left foot secondary to venous insufficiency #2 varicosities bilateral lower extremities  Plan of Care:  #1 Patient was evaluated. #2 medically necessary excisional debridement including subcutaneous tissue was performed using a tissue nipper and a chisel blade. Excisional debridement of all the necrotic nonviable tissue down to healthy bleeding viable tissue was performed with post-debridement measurements same as pre-. #3 the wound was cleansed with normal saline. #4 dry sterile dressing applied.  #5 patient has finished their antibiotic prescription for Bactrim DS  #6 continue daily  application of iodine dry sterile dressing #7 return to clinic in 2 weeks  Edrick Kins, DPM [facility]  Dr. Edrick Kins, [address]                                        [place], [place] [address]                Office [phone]  Fax [phone]

## 2016-08-03 ENCOUNTER — Ambulatory Visit (INDEPENDENT_AMBULATORY_CARE_PROVIDER_SITE_OTHER): Payer: Medicare Other | Admitting: Podiatry

## 2016-08-03 DIAGNOSIS — L97529 Non-pressure chronic ulcer of other part of left foot with unspecified severity: Principal | ICD-10-CM

## 2016-08-03 DIAGNOSIS — I83025 Varicose veins of left lower extremity with ulcer other part of foot: Secondary | ICD-10-CM | POA: Diagnosis not present

## 2016-08-03 DIAGNOSIS — L97522 Non-pressure chronic ulcer of other part of left foot with fat layer exposed: Secondary | ICD-10-CM | POA: Diagnosis not present

## 2016-08-03 NOTE — Progress Notes (Signed)
   Subjective:  Patient presents today for follow-up evaluation of an ulceration to the underside of the second digit left foot secondary to venous insufficiency. Patient states that she's doing very well. She believes the ulcer has healed.  Objective/Physical Exam General: The patient is alert and oriented x3 in no acute distress.  Dermatology:  Ulcer to the plantar crease of the second digit is healed. There is some mild maceration noted however there is complete reepithelialization of the ulcer site. No sign of infectious process noted..  Vascular: Palpable pedal pulses bilaterally. Mild edema noted. Capillary refill within normal limits. Varicosities noted bilateral lower extremities.   Neurological: Epicritic and protective threshold diminished bilaterally.   Musculoskeletal Exam: Range of motion within normal limits to all pedal and ankle joints bilateral. Muscle strength 5/5 in all groups bilateral.   Assessment: #1 ulcer second digit left foot secondary to venous insufficiency-resolved #2 varicosities bilateral lower extremities  Plan of Care:  #1 Patient was evaluated. #2 today the patient is healed. Complete reepithelialization has occurred. Return to clinic when necessary  Edrick Kins, DPM [facility]  Dr. Edrick Kins, [address]                                        [place], [place] [address]                Office [phone]  Fax [phone]

## 2016-08-04 ENCOUNTER — Other Ambulatory Visit: Payer: Self-pay | Admitting: Family Medicine

## 2016-08-04 ENCOUNTER — Ambulatory Visit
Admission: RE | Admit: 2016-08-04 | Discharge: 2016-08-04 | Disposition: A | Discharge status: Home / Self Care | Payer: Medicare Other | Source: Ambulatory Visit | Attending: Family Medicine | Admitting: Family Medicine

## 2016-08-04 DIAGNOSIS — S20229A Contusion of unspecified back wall of thorax, initial encounter: Secondary | ICD-10-CM | POA: Diagnosis not present

## 2016-08-04 DIAGNOSIS — W19XXXA Unspecified fall, initial encounter: Secondary | ICD-10-CM

## 2016-08-04 DIAGNOSIS — R52 Pain, unspecified: Secondary | ICD-10-CM

## 2016-08-04 DIAGNOSIS — M533 Sacrococcygeal disorders, not elsewhere classified: Secondary | ICD-10-CM | POA: Diagnosis not present

## 2016-09-03 DIAGNOSIS — Z23 Encounter for immunization: Secondary | ICD-10-CM | POA: Diagnosis not present

## 2016-09-03 DIAGNOSIS — M159 Polyosteoarthritis, unspecified: Secondary | ICD-10-CM | POA: Diagnosis not present

## 2016-09-03 DIAGNOSIS — R296 Repeated falls: Secondary | ICD-10-CM | POA: Diagnosis not present

## 2016-09-03 DIAGNOSIS — Z0000 Encounter for general adult medical examination without abnormal findings: Secondary | ICD-10-CM | POA: Diagnosis not present

## 2016-09-03 DIAGNOSIS — M810 Age-related osteoporosis without current pathological fracture: Secondary | ICD-10-CM | POA: Diagnosis not present

## 2016-09-03 DIAGNOSIS — E039 Hypothyroidism, unspecified: Secondary | ICD-10-CM | POA: Diagnosis not present

## 2016-09-03 DIAGNOSIS — E559 Vitamin D deficiency, unspecified: Secondary | ICD-10-CM | POA: Diagnosis not present

## 2016-09-03 DIAGNOSIS — I2510 Atherosclerotic heart disease of native coronary artery without angina pectoris: Secondary | ICD-10-CM | POA: Diagnosis not present

## 2016-09-29 DIAGNOSIS — L814 Other melanin hyperpigmentation: Secondary | ICD-10-CM | POA: Diagnosis not present

## 2016-09-29 DIAGNOSIS — L570 Actinic keratosis: Secondary | ICD-10-CM | POA: Diagnosis not present

## 2016-09-29 DIAGNOSIS — D1801 Hemangioma of skin and subcutaneous tissue: Secondary | ICD-10-CM | POA: Diagnosis not present

## 2016-09-29 DIAGNOSIS — C44622 Squamous cell carcinoma of skin of right upper limb, including shoulder: Secondary | ICD-10-CM | POA: Diagnosis not present

## 2016-09-29 DIAGNOSIS — D485 Neoplasm of uncertain behavior of skin: Secondary | ICD-10-CM | POA: Diagnosis not present

## 2016-09-29 DIAGNOSIS — L821 Other seborrheic keratosis: Secondary | ICD-10-CM | POA: Diagnosis not present

## 2016-11-06 DIAGNOSIS — L905 Scar conditions and fibrosis of skin: Secondary | ICD-10-CM | POA: Diagnosis not present

## 2016-11-06 DIAGNOSIS — C44622 Squamous cell carcinoma of skin of right upper limb, including shoulder: Secondary | ICD-10-CM | POA: Diagnosis not present

## 2016-12-01 DIAGNOSIS — H43813 Vitreous degeneration, bilateral: Secondary | ICD-10-CM | POA: Diagnosis not present

## 2016-12-01 DIAGNOSIS — Z961 Presence of intraocular lens: Secondary | ICD-10-CM | POA: Diagnosis not present

## 2016-12-01 DIAGNOSIS — H353232 Exudative age-related macular degeneration, bilateral, with inactive choroidal neovascularization: Secondary | ICD-10-CM | POA: Diagnosis not present

## 2016-12-28 ENCOUNTER — Encounter: Payer: Self-pay | Admitting: Podiatry

## 2016-12-28 ENCOUNTER — Ambulatory Visit (INDEPENDENT_AMBULATORY_CARE_PROVIDER_SITE_OTHER): Payer: Medicare Other | Admitting: Podiatry

## 2016-12-28 DIAGNOSIS — M79676 Pain in unspecified toe(s): Secondary | ICD-10-CM

## 2016-12-28 DIAGNOSIS — B351 Tinea unguium: Secondary | ICD-10-CM

## 2016-12-28 NOTE — Progress Notes (Signed)
   SUBJECTIVE Patient  presents to office today complaining of elongated, thickened nails. Pain while ambulating in shoes. Patient is unable to trim their own nails.   OBJECTIVE General Patient is awake, alert, and oriented x 3 and in no acute distress. Derm Skin is dry and supple bilateral. Negative open lesions or macerations. Remaining integument unremarkable. Nails are tender, long, thickened and dystrophic with subungual debris, consistent with onychomycosis, 1-5 bilateral. No signs of infection noted. Vasc  DP and PT pedal pulses palpable bilaterally. Temperature gradient within normal limits.  Neuro Epicritic and protective threshold sensation diminished bilaterally.  Musculoskeletal Exam No symptomatic pedal deformities noted bilateral. Muscular strength within normal limits.  ASSESSMENT 1. Onychodystrophic nails 1-5 bilateral with hyperkeratosis of nails.  2. Onychomycosis of nail due to dermatophyte bilateral 3. Pain in foot bilateral  PLAN OF CARE 1. Patient evaluated today.  2. Instructed to maintain good pedal hygiene and foot care.  3. Mechanical debridement of nails 1-5 bilaterally performed using a nail nipper. Filed with dremel without incident.  4. Return to clinic in 3 mos.    Brent M. Evans, DPM [facility]  Dr. Brent M. Evans, DPM    [address]                                        [place], [place] [address]                Office [phone]  Fax [phone]      

## 2017-01-14 DIAGNOSIS — M810 Age-related osteoporosis without current pathological fracture: Secondary | ICD-10-CM | POA: Diagnosis not present

## 2017-01-14 DIAGNOSIS — I2510 Atherosclerotic heart disease of native coronary artery without angina pectoris: Secondary | ICD-10-CM | POA: Diagnosis not present

## 2017-01-14 DIAGNOSIS — E039 Hypothyroidism, unspecified: Secondary | ICD-10-CM | POA: Diagnosis not present

## 2017-01-14 DIAGNOSIS — E559 Vitamin D deficiency, unspecified: Secondary | ICD-10-CM | POA: Diagnosis not present

## 2017-01-14 DIAGNOSIS — M159 Polyosteoarthritis, unspecified: Secondary | ICD-10-CM | POA: Diagnosis not present

## 2017-01-28 DIAGNOSIS — H353232 Exudative age-related macular degeneration, bilateral, with inactive choroidal neovascularization: Secondary | ICD-10-CM | POA: Diagnosis not present

## 2017-01-28 DIAGNOSIS — H43813 Vitreous degeneration, bilateral: Secondary | ICD-10-CM | POA: Diagnosis not present

## 2017-01-28 DIAGNOSIS — H35423 Microcystoid degeneration of retina, bilateral: Secondary | ICD-10-CM | POA: Diagnosis not present

## 2017-03-29 ENCOUNTER — Encounter (INDEPENDENT_AMBULATORY_CARE_PROVIDER_SITE_OTHER): Payer: Medicare Other | Admitting: Podiatry

## 2017-03-29 NOTE — Progress Notes (Signed)
This encounter was created in error - please disregard.

## 2017-05-01 DIAGNOSIS — R300 Dysuria: Secondary | ICD-10-CM | POA: Diagnosis not present

## 2017-05-01 DIAGNOSIS — R410 Disorientation, unspecified: Secondary | ICD-10-CM | POA: Diagnosis not present

## 2017-05-21 DIAGNOSIS — M810 Age-related osteoporosis without current pathological fracture: Secondary | ICD-10-CM | POA: Diagnosis not present

## 2017-05-21 DIAGNOSIS — R4189 Other symptoms and signs involving cognitive functions and awareness: Secondary | ICD-10-CM | POA: Diagnosis not present

## 2017-05-21 DIAGNOSIS — E039 Hypothyroidism, unspecified: Secondary | ICD-10-CM | POA: Diagnosis not present

## 2017-05-21 DIAGNOSIS — I2510 Atherosclerotic heart disease of native coronary artery without angina pectoris: Secondary | ICD-10-CM | POA: Diagnosis not present

## 2017-05-21 DIAGNOSIS — E559 Vitamin D deficiency, unspecified: Secondary | ICD-10-CM | POA: Diagnosis not present

## 2017-05-21 DIAGNOSIS — R471 Dysarthria and anarthria: Secondary | ICD-10-CM | POA: Diagnosis not present

## 2017-05-21 DIAGNOSIS — R5381 Other malaise: Secondary | ICD-10-CM | POA: Diagnosis not present

## 2017-05-21 DIAGNOSIS — M159 Polyosteoarthritis, unspecified: Secondary | ICD-10-CM | POA: Diagnosis not present

## 2017-05-21 DIAGNOSIS — R4182 Altered mental status, unspecified: Secondary | ICD-10-CM | POA: Diagnosis not present

## 2017-05-26 ENCOUNTER — Emergency Department (HOSPITAL_COMMUNITY): Payer: Medicare Other

## 2017-05-26 ENCOUNTER — Encounter (HOSPITAL_COMMUNITY): Payer: Self-pay | Admitting: Emergency Medicine

## 2017-05-26 ENCOUNTER — Emergency Department (HOSPITAL_COMMUNITY)
Admission: EM | Admit: 2017-05-26 | Discharge: 2017-05-26 | Disposition: A | Discharge status: Home / Self Care | Payer: Medicare Other | Attending: Emergency Medicine | Admitting: Emergency Medicine

## 2017-05-26 DIAGNOSIS — Y998 Other external cause status: Secondary | ICD-10-CM | POA: Diagnosis not present

## 2017-05-26 DIAGNOSIS — I2510 Atherosclerotic heart disease of native coronary artery without angina pectoris: Secondary | ICD-10-CM | POA: Diagnosis not present

## 2017-05-26 DIAGNOSIS — I5030 Unspecified diastolic (congestive) heart failure: Secondary | ICD-10-CM | POA: Diagnosis not present

## 2017-05-26 DIAGNOSIS — J449 Chronic obstructive pulmonary disease, unspecified: Secondary | ICD-10-CM | POA: Diagnosis not present

## 2017-05-26 DIAGNOSIS — S0003XA Contusion of scalp, initial encounter: Secondary | ICD-10-CM | POA: Diagnosis not present

## 2017-05-26 DIAGNOSIS — Y9389 Activity, other specified: Secondary | ICD-10-CM | POA: Insufficient documentation

## 2017-05-26 DIAGNOSIS — S199XXA Unspecified injury of neck, initial encounter: Secondary | ICD-10-CM | POA: Diagnosis not present

## 2017-05-26 DIAGNOSIS — I110 Hypertensive heart disease with heart failure: Secondary | ICD-10-CM | POA: Diagnosis not present

## 2017-05-26 DIAGNOSIS — Z85820 Personal history of malignant melanoma of skin: Secondary | ICD-10-CM | POA: Diagnosis not present

## 2017-05-26 DIAGNOSIS — S0990XA Unspecified injury of head, initial encounter: Secondary | ICD-10-CM | POA: Diagnosis not present

## 2017-05-26 DIAGNOSIS — W01198A Fall on same level from slipping, tripping and stumbling with subsequent striking against other object, initial encounter: Secondary | ICD-10-CM | POA: Insufficient documentation

## 2017-05-26 DIAGNOSIS — Z79899 Other long term (current) drug therapy: Secondary | ICD-10-CM | POA: Insufficient documentation

## 2017-05-26 DIAGNOSIS — W19XXXA Unspecified fall, initial encounter: Secondary | ICD-10-CM

## 2017-05-26 DIAGNOSIS — S0101XA Laceration without foreign body of scalp, initial encounter: Secondary | ICD-10-CM | POA: Insufficient documentation

## 2017-05-26 DIAGNOSIS — Y92002 Bathroom of unspecified non-institutional (private) residence single-family (private) house as the place of occurrence of the external cause: Secondary | ICD-10-CM | POA: Diagnosis not present

## 2017-05-26 DIAGNOSIS — S0190XA Unspecified open wound of unspecified part of head, initial encounter: Secondary | ICD-10-CM | POA: Diagnosis not present

## 2017-05-26 LAB — URINALYSIS, ROUTINE W REFLEX MICROSCOPIC
BILIRUBIN URINE: NEGATIVE
Glucose, UA: NEGATIVE mg/dL
HGB URINE DIPSTICK: NEGATIVE
Ketones, ur: NEGATIVE mg/dL
Leukocytes, UA: NEGATIVE
Nitrite: NEGATIVE
PROTEIN: NEGATIVE mg/dL
Specific Gravity, Urine: 1.014 (ref 1.005–1.030)
pH: 6 (ref 5.0–8.0)

## 2017-05-26 MED ORDER — LIDOCAINE-EPINEPHRINE (PF) 2 %-1:200000 IJ SOLN
10.0000 mL | Freq: Once | INTRAMUSCULAR | Status: AC
  Administered 2017-05-26: 10 mL via INTRADERMAL
  Filled 2017-05-26: qty 20

## 2017-05-26 MED ORDER — HYDROGEN PEROXIDE 3 % EX SOLN
CUTANEOUS | Status: AC
  Filled 2017-05-26: qty 473

## 2017-05-26 NOTE — ED Provider Notes (Signed)
[facility] DEPT Provider Note   CSN: [id number] Arrival date & time: 05/26/17  1217     History   Chief Complaint Chief Complaint  Patient presents with  . Fall    HPI Alice Hayes is a 83 y.o. female.  HPI  83yo female wh presents with concern for fall.  Patient reports she was washing her hands and as she was backing away from the sink and fell backwards.  Reports hitting head, has pain to hematoma on left side of head. Denies other areas of injury or pain. Denies neck pain, back pain, chest pain, abdominal pain. Denies numbness, weakness, recent illness.   Past Medical History:  Diagnosis Date  . CAD (coronary artery disease)    nonobstructive cath 2006  . Cholelithiases   . COPD (chronic obstructive pulmonary disease) ([place])   . Diastolic heart failure ([place])   . HTN (hypertension)   . Hyperlipidemia   . Melanoma ([place])   . Osteoarthritis   . Osteoporosis     Patient Active Problem List   Diagnosis Date Noted  . Dyspnea 11/23/2013    Past Surgical History:  Procedure Laterality Date  . APPENDECTOMY    . BLADDER SUSPENSION     2006  . BREAST LUMPECTOMY    . CARPAL TUNNEL RELEASE     2007  . CATARACT EXTRACTION    . LYMPH NODE BIOPSY    . MELANOMA EXCISION     2008  . PAROTIDECTOMY     1959  . TONSILLECTOMY AND ADENOIDECTOMY      OB History    No data available       Home Medications    Prior to Admission medications   Medication Sig Start Date End Date Taking? Authorizing Provider  citalopram (CELEXA) 10 MG tablet  12/01/16   [provider]  gabapentin (NEURONTIN) 600 MG tablet Take 600 mg by mouth 2 (two) times daily.    [provider]  loratadine (CLARITIN) 10 MG tablet Take 10 mg by mouth daily.    [provider]  meclizine (ANTIVERT) 25 MG tablet Take 25 mg by mouth 3 (three) times daily as needed for dizziness.    [provider]  Multiple Vitamin (MULTIVITAMIN  WITH MINERALS) TABS tablet Take 1 tablet by mouth daily.    [provider]  Multiple Vitamins-Minerals (ICAPS) CAPS Take 1 capsule by mouth daily.     [provider]  omega-3 acid ethyl esters (LOVAZA) 1 G capsule Take 1 g by mouth daily.     [provider]  oxybutynin (DITROPAN) 5 MG tablet Take 5 mg by mouth daily.  02/28/15   [provider]  sulfamethoxazole-trimethoprim (BACTRIM DS,SEPTRA DS) 800-160 MG tablet Take 1 tablet by mouth 2 (two) times daily. 07/06/16   Edrick Kins, DPM  thyroid (ARMOUR THYROID) 60 MG tablet Take 90 mg by mouth daily before breakfast.     [provider]    Family History Family History  Problem Relation Age of Onset  . CAD Father        Sudden death age 46  . CAD Son 35       MI.  Alive in his 85s  . Cancer Daughter        Breast  . Pancreatitis Daughter        Severe episode    Social History Social History   Tobacco Use  . Smoking status: Never Smoker  . Smokeless tobacco: Never  Used  Substance Use Topics  . Alcohol use: Not on file  . Drug use: Not on file     Allergies   Voltaren ophthalmic [diclofenac]   Review of Systems Review of Systems  Constitutional: Negative for fever.  HENT: Negative for sore throat.   Eyes: Negative for visual disturbance.  Respiratory: Negative for cough and shortness of breath.   Cardiovascular: Negative for chest pain.  Gastrointestinal: Negative for abdominal pain, nausea and vomiting.  Genitourinary: Negative for difficulty urinating.  Musculoskeletal: Negative for back pain and neck pain.  Skin: Positive for wound. Negative for rash.  Neurological: Positive for headaches. Negative for syncope, weakness and numbness.     Physical Exam Updated Vital Signs BP (!) 142/76 (BP Location: Right Arm)   Pulse 66   Temp 97.8 F (36.6 C) (Oral)   Resp 16   SpO2 98%   Physical Exam  Constitutional: She is oriented to person, place, and time. She  appears well-developed and well-nourished. No distress.  HENT:  Head: Normocephalic.  Left hematoma laceration to crown of scalp 3cm  Eyes: Conjunctivae and EOM are normal.  Neck: Normal range of motion.  Cardiovascular: Normal rate, regular rhythm, normal heart sounds and intact distal pulses. Exam reveals no gallop and no friction rub.  No murmur heard. Pulmonary/Chest: Effort normal and breath sounds normal. No respiratory distress. She has no wheezes. She has no rales.  Abdominal: Soft. She exhibits no distension. There is no tenderness. There is no guarding.  Musculoskeletal: She exhibits no edema or tenderness.       Cervical back: She exhibits no bony tenderness.       Thoracic back: She exhibits no bony tenderness.       Lumbar back: She exhibits no bony tenderness.  Neurological: She is alert and oriented to person, place, and time.  Skin: Skin is warm and dry. Laceration (3cm crown of head) noted. No rash noted. She is not diaphoretic. No erythema.  Nursing note and vitals reviewed.    ED Treatments / Results  Labs (all labs ordered are listed, but only abnormal results are displayed) Labs Reviewed  URINALYSIS, ROUTINE W REFLEX MICROSCOPIC - Abnormal; Notable for the following components:      Result Value   APPearance HAZY (*)    All other components within normal limits    EKG  EKG Interpretation None       Radiology Ct Head Wo Contrast  Result Date: 05/26/2017 CLINICAL DATA:  Fall, left posterior parietal scalp contusion EXAM: CT HEAD WITHOUT CONTRAST CT CERVICAL SPINE WITHOUT CONTRAST TECHNIQUE: Multidetector CT imaging of the head and cervical spine was performed following the standard protocol without intravenous contrast. Multiplanar CT image reconstructions of the cervical spine were also generated. COMPARISON:  03/12/2015 FINDINGS: CT HEAD FINDINGS Brain: Chronic brain atrophy and advanced white matter microvascular ischemic changes throughout both cerebral  hemisphere. Chronic associated ventricular enlargement. No acute intracranial hemorrhage, mass lesion, new infarction, midline shift, herniation, or extra-axial fluid collection. No focal mass effect or edema. Cisterns are patent. Cerebellar atrophy as well. Vascular: Intracranial atherosclerosis noted.  No hyperdense vessel. Skull: Acute left posterior parietal scalp hematoma noted. No underlying fracture. Skull appears intact. Mastoids clear. Sinuses/Orbits: No acute finding. Other: None. CT CERVICAL SPINE FINDINGS Alignment: Stable alignment with chronic anterolisthesis of C6 upon C7 ulna appearing secondary to degenerative spondylosis and arthropathy. Skull base and vertebrae: No acute fracture. No primary bone lesion or focal pathologic process. Soft tissues and spinal canal: No  prevertebral fluid or swelling. No visible canal hematoma. Disc levels: Severe degenerative spondylosis of the cervical spine most pronounced at C5-6 with disc space narrowing, sclerosis and osteophytes. Upper chest: Thoracic aortic atherosclerosis. Apical scarring noted. Other: None. IMPRESSION: Brain atrophy and chronic white matter microvascular changes. No acute intracranial abnormality by noncontrast imaging. Acute left posterior parietal scalp hematoma. No underlying fracture. Stable cervical degenerative spondylosis and anterolisthesis of C6 upon C7. No acute cervical spine fracture or significant interval change by noncontrast CT. Electronically Signed   By: Jerilynn Mages.  Shick M.D.   On: 05/26/2017 13:48   Ct Cervical Spine Wo Contrast  Result Date: 05/26/2017 CLINICAL DATA:  Fall, left posterior parietal scalp contusion EXAM: CT HEAD WITHOUT CONTRAST CT CERVICAL SPINE WITHOUT CONTRAST TECHNIQUE: Multidetector CT imaging of the head and cervical spine was performed following the standard protocol without intravenous contrast. Multiplanar CT image reconstructions of the cervical spine were also generated. COMPARISON:  03/12/2015  FINDINGS: CT HEAD FINDINGS Brain: Chronic brain atrophy and advanced white matter microvascular ischemic changes throughout both cerebral hemisphere. Chronic associated ventricular enlargement. No acute intracranial hemorrhage, mass lesion, new infarction, midline shift, herniation, or extra-axial fluid collection. No focal mass effect or edema. Cisterns are patent. Cerebellar atrophy as well. Vascular: Intracranial atherosclerosis noted.  No hyperdense vessel. Skull: Acute left posterior parietal scalp hematoma noted. No underlying fracture. Skull appears intact. Mastoids clear. Sinuses/Orbits: No acute finding. Other: None. CT CERVICAL SPINE FINDINGS Alignment: Stable alignment with chronic anterolisthesis of C6 upon C7 ulna appearing secondary to degenerative spondylosis and arthropathy. Skull base and vertebrae: No acute fracture. No primary bone lesion or focal pathologic process. Soft tissues and spinal canal: No prevertebral fluid or swelling. No visible canal hematoma. Disc levels: Severe degenerative spondylosis of the cervical spine most pronounced at C5-6 with disc space narrowing, sclerosis and osteophytes. Upper chest: Thoracic aortic atherosclerosis. Apical scarring noted. Other: None. IMPRESSION: Brain atrophy and chronic white matter microvascular changes. No acute intracranial abnormality by noncontrast imaging. Acute left posterior parietal scalp hematoma. No underlying fracture. Stable cervical degenerative spondylosis and anterolisthesis of C6 upon C7. No acute cervical spine fracture or significant interval change by noncontrast CT. Electronically Signed   By: Jerilynn Mages.  Shick M.D.   On: 05/26/2017 13:48    Procedures .Marland KitchenLaceration Repair Date/Time: 05/27/2017 1:10 PM Performed by: Gareth Morgan, MD Authorized by: Gareth Morgan, MD   Consent:    Consent obtained:  Verbal   Consent given by:  Patient   Risks discussed:  Infection and pain Laceration details:    Location:  Scalp    Scalp location:  Crown   Length (cm):  3   Depth (mm):  4 Repair type:    Repair type:  Simple Pre-procedure details:    Preparation:  Patient was prepped and draped in usual sterile fashion and imaging obtained to evaluate for foreign bodies Exploration:    Contaminated: no   Treatment:    Area cleansed with:  Saline   Amount of cleaning:  Standard   Irrigation solution:  Sterile saline Skin repair:    Repair method:  Staples Approximation:    Approximation:  Close Post-procedure details:    Dressing:  Open (no dressing)   Patient tolerance of procedure:  Tolerated well, no immediate complications   (including critical care time)  Medications Ordered in ED Medications  lidocaine-EPINEPHrine (XYLOCAINE W/EPI) 2 %-1:200000 (PF) injection 10 mL (10 mLs Intradermal Given 05/26/17 1451)     Initial Impression / Assessment and Plan / ED  Course  I have reviewed the triage vital signs and the nursing notes.  Pertinent labs & imaging results that were available during my care of the patient were reviewed by me and considered in my medical decision making (see chart for details).    83yo female with history above presents with fall. CT head CSPine without acute abnormalities. No other signs of injury by history and exam. Scalp laceration closed with 3 staples.  Patient discharged in stable condition with understanding of reasons to return.   Final Clinical Impressions(s) / ED Diagnoses   Final diagnoses:  Fall, initial encounter  Laceration of scalp, initial encounter    ED Discharge Orders    None       Gareth Morgan, MD 05/27/17 1314

## 2017-05-26 NOTE — ED Triage Notes (Signed)
Per EMS-patient slipped in bathroom at [employer]-no LOC, 3 cm lac to right posterior head-patient has no complaints-states her personal nursing aid was with her and witnessed fall

## 2017-05-26 NOTE — ED Notes (Signed)
Bed: [facility] Expected date:  Expected time:  Means of arrival:  Comments: EMS fall 

## 2017-06-03 DIAGNOSIS — R4789 Other speech disturbances: Secondary | ICD-10-CM | POA: Diagnosis not present

## 2017-06-03 DIAGNOSIS — R2681 Unsteadiness on feet: Secondary | ICD-10-CM | POA: Diagnosis not present

## 2017-06-03 DIAGNOSIS — S0101XA Laceration without foreign body of scalp, initial encounter: Secondary | ICD-10-CM | POA: Diagnosis not present

## 2017-06-03 DIAGNOSIS — R296 Repeated falls: Secondary | ICD-10-CM | POA: Diagnosis not present

## 2017-06-08 DIAGNOSIS — R278 Other lack of coordination: Secondary | ICD-10-CM | POA: Diagnosis not present

## 2017-06-08 DIAGNOSIS — M6281 Muscle weakness (generalized): Secondary | ICD-10-CM | POA: Diagnosis not present

## 2017-06-08 DIAGNOSIS — R2681 Unsteadiness on feet: Secondary | ICD-10-CM | POA: Diagnosis not present

## 2017-06-08 DIAGNOSIS — R41841 Cognitive communication deficit: Secondary | ICD-10-CM | POA: Diagnosis not present

## 2017-06-08 DIAGNOSIS — Z9181 History of falling: Secondary | ICD-10-CM | POA: Diagnosis not present

## 2017-06-09 DIAGNOSIS — R2681 Unsteadiness on feet: Secondary | ICD-10-CM | POA: Diagnosis not present

## 2017-06-09 DIAGNOSIS — M6281 Muscle weakness (generalized): Secondary | ICD-10-CM | POA: Diagnosis not present

## 2017-06-09 DIAGNOSIS — R41841 Cognitive communication deficit: Secondary | ICD-10-CM | POA: Diagnosis not present

## 2017-06-09 DIAGNOSIS — R278 Other lack of coordination: Secondary | ICD-10-CM | POA: Diagnosis not present

## 2017-06-09 DIAGNOSIS — Z9181 History of falling: Secondary | ICD-10-CM | POA: Diagnosis not present

## 2017-06-10 DIAGNOSIS — R2681 Unsteadiness on feet: Secondary | ICD-10-CM | POA: Diagnosis not present

## 2017-06-10 DIAGNOSIS — Z9181 History of falling: Secondary | ICD-10-CM | POA: Diagnosis not present

## 2017-06-10 DIAGNOSIS — R41841 Cognitive communication deficit: Secondary | ICD-10-CM | POA: Diagnosis not present

## 2017-06-10 DIAGNOSIS — R278 Other lack of coordination: Secondary | ICD-10-CM | POA: Diagnosis not present

## 2017-06-10 DIAGNOSIS — M6281 Muscle weakness (generalized): Secondary | ICD-10-CM | POA: Diagnosis not present

## 2017-06-11 DIAGNOSIS — Z9181 History of falling: Secondary | ICD-10-CM | POA: Diagnosis not present

## 2017-06-11 DIAGNOSIS — H90A22 Sensorineural hearing loss, unilateral, left ear, with restricted hearing on the contralateral side: Secondary | ICD-10-CM | POA: Diagnosis not present

## 2017-06-11 DIAGNOSIS — R2681 Unsteadiness on feet: Secondary | ICD-10-CM | POA: Diagnosis not present

## 2017-06-11 DIAGNOSIS — M6281 Muscle weakness (generalized): Secondary | ICD-10-CM | POA: Diagnosis not present

## 2017-06-11 DIAGNOSIS — R278 Other lack of coordination: Secondary | ICD-10-CM | POA: Diagnosis not present

## 2017-06-11 DIAGNOSIS — R41841 Cognitive communication deficit: Secondary | ICD-10-CM | POA: Diagnosis not present

## 2017-06-14 DIAGNOSIS — R41841 Cognitive communication deficit: Secondary | ICD-10-CM | POA: Diagnosis not present

## 2017-06-14 DIAGNOSIS — R278 Other lack of coordination: Secondary | ICD-10-CM | POA: Diagnosis not present

## 2017-06-14 DIAGNOSIS — Z9181 History of falling: Secondary | ICD-10-CM | POA: Diagnosis not present

## 2017-06-14 DIAGNOSIS — R2681 Unsteadiness on feet: Secondary | ICD-10-CM | POA: Diagnosis not present

## 2017-06-14 DIAGNOSIS — M6281 Muscle weakness (generalized): Secondary | ICD-10-CM | POA: Diagnosis not present

## 2017-06-15 DIAGNOSIS — Z9181 History of falling: Secondary | ICD-10-CM | POA: Diagnosis not present

## 2017-06-15 DIAGNOSIS — R41841 Cognitive communication deficit: Secondary | ICD-10-CM | POA: Diagnosis not present

## 2017-06-15 DIAGNOSIS — M6281 Muscle weakness (generalized): Secondary | ICD-10-CM | POA: Diagnosis not present

## 2017-06-15 DIAGNOSIS — R278 Other lack of coordination: Secondary | ICD-10-CM | POA: Diagnosis not present

## 2017-06-15 DIAGNOSIS — R2681 Unsteadiness on feet: Secondary | ICD-10-CM | POA: Diagnosis not present

## 2017-06-17 DIAGNOSIS — M6281 Muscle weakness (generalized): Secondary | ICD-10-CM | POA: Diagnosis not present

## 2017-06-17 DIAGNOSIS — R2681 Unsteadiness on feet: Secondary | ICD-10-CM | POA: Diagnosis not present

## 2017-06-17 DIAGNOSIS — R278 Other lack of coordination: Secondary | ICD-10-CM | POA: Diagnosis not present

## 2017-06-17 DIAGNOSIS — Z9181 History of falling: Secondary | ICD-10-CM | POA: Diagnosis not present

## 2017-06-17 DIAGNOSIS — R41841 Cognitive communication deficit: Secondary | ICD-10-CM | POA: Diagnosis not present

## 2017-06-18 DIAGNOSIS — R2681 Unsteadiness on feet: Secondary | ICD-10-CM | POA: Diagnosis not present

## 2017-06-18 DIAGNOSIS — R278 Other lack of coordination: Secondary | ICD-10-CM | POA: Diagnosis not present

## 2017-06-18 DIAGNOSIS — M6281 Muscle weakness (generalized): Secondary | ICD-10-CM | POA: Diagnosis not present

## 2017-06-18 DIAGNOSIS — R41841 Cognitive communication deficit: Secondary | ICD-10-CM | POA: Diagnosis not present

## 2017-06-18 DIAGNOSIS — Z9181 History of falling: Secondary | ICD-10-CM | POA: Diagnosis not present

## 2017-06-21 DIAGNOSIS — R41841 Cognitive communication deficit: Secondary | ICD-10-CM | POA: Diagnosis not present

## 2017-06-21 DIAGNOSIS — R278 Other lack of coordination: Secondary | ICD-10-CM | POA: Diagnosis not present

## 2017-06-21 DIAGNOSIS — Z9181 History of falling: Secondary | ICD-10-CM | POA: Diagnosis not present

## 2017-06-21 DIAGNOSIS — M6281 Muscle weakness (generalized): Secondary | ICD-10-CM | POA: Diagnosis not present

## 2017-06-21 DIAGNOSIS — R2681 Unsteadiness on feet: Secondary | ICD-10-CM | POA: Diagnosis not present

## 2017-06-22 DIAGNOSIS — R2681 Unsteadiness on feet: Secondary | ICD-10-CM | POA: Diagnosis not present

## 2017-06-22 DIAGNOSIS — Z9181 History of falling: Secondary | ICD-10-CM | POA: Diagnosis not present

## 2017-06-22 DIAGNOSIS — M6281 Muscle weakness (generalized): Secondary | ICD-10-CM | POA: Diagnosis not present

## 2017-06-22 DIAGNOSIS — R41841 Cognitive communication deficit: Secondary | ICD-10-CM | POA: Diagnosis not present

## 2017-06-22 DIAGNOSIS — R278 Other lack of coordination: Secondary | ICD-10-CM | POA: Diagnosis not present

## 2017-06-23 DIAGNOSIS — R278 Other lack of coordination: Secondary | ICD-10-CM | POA: Diagnosis not present

## 2017-06-23 DIAGNOSIS — M6281 Muscle weakness (generalized): Secondary | ICD-10-CM | POA: Diagnosis not present

## 2017-06-23 DIAGNOSIS — R2681 Unsteadiness on feet: Secondary | ICD-10-CM | POA: Diagnosis not present

## 2017-06-23 DIAGNOSIS — Z9181 History of falling: Secondary | ICD-10-CM | POA: Diagnosis not present

## 2017-06-23 DIAGNOSIS — R41841 Cognitive communication deficit: Secondary | ICD-10-CM | POA: Diagnosis not present

## 2017-06-24 DIAGNOSIS — R2681 Unsteadiness on feet: Secondary | ICD-10-CM | POA: Diagnosis not present

## 2017-06-24 DIAGNOSIS — R41841 Cognitive communication deficit: Secondary | ICD-10-CM | POA: Diagnosis not present

## 2017-06-24 DIAGNOSIS — R278 Other lack of coordination: Secondary | ICD-10-CM | POA: Diagnosis not present

## 2017-06-24 DIAGNOSIS — Z9181 History of falling: Secondary | ICD-10-CM | POA: Diagnosis not present

## 2017-06-24 DIAGNOSIS — M6281 Muscle weakness (generalized): Secondary | ICD-10-CM | POA: Diagnosis not present

## 2017-06-25 DIAGNOSIS — R2681 Unsteadiness on feet: Secondary | ICD-10-CM | POA: Diagnosis not present

## 2017-06-25 DIAGNOSIS — R278 Other lack of coordination: Secondary | ICD-10-CM | POA: Diagnosis not present

## 2017-06-25 DIAGNOSIS — Z9181 History of falling: Secondary | ICD-10-CM | POA: Diagnosis not present

## 2017-06-25 DIAGNOSIS — M6281 Muscle weakness (generalized): Secondary | ICD-10-CM | POA: Diagnosis not present

## 2017-06-25 DIAGNOSIS — R41841 Cognitive communication deficit: Secondary | ICD-10-CM | POA: Diagnosis not present

## 2017-06-28 DIAGNOSIS — Z9181 History of falling: Secondary | ICD-10-CM | POA: Diagnosis not present

## 2017-06-28 DIAGNOSIS — R278 Other lack of coordination: Secondary | ICD-10-CM | POA: Diagnosis not present

## 2017-06-28 DIAGNOSIS — R2681 Unsteadiness on feet: Secondary | ICD-10-CM | POA: Diagnosis not present

## 2017-06-28 DIAGNOSIS — M6281 Muscle weakness (generalized): Secondary | ICD-10-CM | POA: Diagnosis not present

## 2017-06-28 DIAGNOSIS — R41841 Cognitive communication deficit: Secondary | ICD-10-CM | POA: Diagnosis not present

## 2017-06-29 DIAGNOSIS — Z9181 History of falling: Secondary | ICD-10-CM | POA: Diagnosis not present

## 2017-06-29 DIAGNOSIS — R278 Other lack of coordination: Secondary | ICD-10-CM | POA: Diagnosis not present

## 2017-06-29 DIAGNOSIS — R2681 Unsteadiness on feet: Secondary | ICD-10-CM | POA: Diagnosis not present

## 2017-06-29 DIAGNOSIS — R41841 Cognitive communication deficit: Secondary | ICD-10-CM | POA: Diagnosis not present

## 2017-06-29 DIAGNOSIS — M6281 Muscle weakness (generalized): Secondary | ICD-10-CM | POA: Diagnosis not present

## 2017-06-30 DIAGNOSIS — R41841 Cognitive communication deficit: Secondary | ICD-10-CM | POA: Diagnosis not present

## 2017-06-30 DIAGNOSIS — M6281 Muscle weakness (generalized): Secondary | ICD-10-CM | POA: Diagnosis not present

## 2017-06-30 DIAGNOSIS — Z9181 History of falling: Secondary | ICD-10-CM | POA: Diagnosis not present

## 2017-06-30 DIAGNOSIS — R278 Other lack of coordination: Secondary | ICD-10-CM | POA: Diagnosis not present

## 2017-06-30 DIAGNOSIS — R2681 Unsteadiness on feet: Secondary | ICD-10-CM | POA: Diagnosis not present

## 2017-07-01 DIAGNOSIS — Z9181 History of falling: Secondary | ICD-10-CM | POA: Diagnosis not present

## 2017-07-01 DIAGNOSIS — M6281 Muscle weakness (generalized): Secondary | ICD-10-CM | POA: Diagnosis not present

## 2017-07-01 DIAGNOSIS — R278 Other lack of coordination: Secondary | ICD-10-CM | POA: Diagnosis not present

## 2017-07-01 DIAGNOSIS — R41841 Cognitive communication deficit: Secondary | ICD-10-CM | POA: Diagnosis not present

## 2017-07-01 DIAGNOSIS — R2681 Unsteadiness on feet: Secondary | ICD-10-CM | POA: Diagnosis not present

## 2017-07-02 DIAGNOSIS — R278 Other lack of coordination: Secondary | ICD-10-CM | POA: Diagnosis not present

## 2017-07-02 DIAGNOSIS — R41841 Cognitive communication deficit: Secondary | ICD-10-CM | POA: Diagnosis not present

## 2017-07-02 DIAGNOSIS — R2681 Unsteadiness on feet: Secondary | ICD-10-CM | POA: Diagnosis not present

## 2017-07-02 DIAGNOSIS — Z9181 History of falling: Secondary | ICD-10-CM | POA: Diagnosis not present

## 2017-07-02 DIAGNOSIS — M6281 Muscle weakness (generalized): Secondary | ICD-10-CM | POA: Diagnosis not present

## 2017-07-05 DIAGNOSIS — R41841 Cognitive communication deficit: Secondary | ICD-10-CM | POA: Diagnosis not present

## 2017-07-05 DIAGNOSIS — M6281 Muscle weakness (generalized): Secondary | ICD-10-CM | POA: Diagnosis not present

## 2017-07-05 DIAGNOSIS — Z9181 History of falling: Secondary | ICD-10-CM | POA: Diagnosis not present

## 2017-07-05 DIAGNOSIS — R278 Other lack of coordination: Secondary | ICD-10-CM | POA: Diagnosis not present

## 2017-07-05 DIAGNOSIS — R2681 Unsteadiness on feet: Secondary | ICD-10-CM | POA: Diagnosis not present

## 2017-07-06 DIAGNOSIS — R278 Other lack of coordination: Secondary | ICD-10-CM | POA: Diagnosis not present

## 2017-07-06 DIAGNOSIS — R2681 Unsteadiness on feet: Secondary | ICD-10-CM | POA: Diagnosis not present

## 2017-07-06 DIAGNOSIS — R41841 Cognitive communication deficit: Secondary | ICD-10-CM | POA: Diagnosis not present

## 2017-07-06 DIAGNOSIS — Z9181 History of falling: Secondary | ICD-10-CM | POA: Diagnosis not present

## 2017-07-06 DIAGNOSIS — M6281 Muscle weakness (generalized): Secondary | ICD-10-CM | POA: Diagnosis not present

## 2017-07-07 DIAGNOSIS — Z9181 History of falling: Secondary | ICD-10-CM | POA: Diagnosis not present

## 2017-07-07 DIAGNOSIS — R2681 Unsteadiness on feet: Secondary | ICD-10-CM | POA: Diagnosis not present

## 2017-07-07 DIAGNOSIS — R278 Other lack of coordination: Secondary | ICD-10-CM | POA: Diagnosis not present

## 2017-07-07 DIAGNOSIS — R41841 Cognitive communication deficit: Secondary | ICD-10-CM | POA: Diagnosis not present

## 2017-07-07 DIAGNOSIS — M6281 Muscle weakness (generalized): Secondary | ICD-10-CM | POA: Diagnosis not present

## 2017-07-08 DIAGNOSIS — E039 Hypothyroidism, unspecified: Secondary | ICD-10-CM | POA: Diagnosis not present

## 2017-07-08 DIAGNOSIS — R4189 Other symptoms and signs involving cognitive functions and awareness: Secondary | ICD-10-CM | POA: Diagnosis not present

## 2017-07-08 DIAGNOSIS — R5381 Other malaise: Secondary | ICD-10-CM | POA: Diagnosis not present

## 2017-07-08 DIAGNOSIS — R296 Repeated falls: Secondary | ICD-10-CM | POA: Diagnosis not present

## 2017-07-08 DIAGNOSIS — R2681 Unsteadiness on feet: Secondary | ICD-10-CM | POA: Diagnosis not present

## 2017-07-08 DIAGNOSIS — M810 Age-related osteoporosis without current pathological fracture: Secondary | ICD-10-CM | POA: Diagnosis not present

## 2017-07-08 DIAGNOSIS — M6281 Muscle weakness (generalized): Secondary | ICD-10-CM | POA: Diagnosis not present

## 2017-07-08 DIAGNOSIS — Z9181 History of falling: Secondary | ICD-10-CM | POA: Diagnosis not present

## 2017-07-08 DIAGNOSIS — R4182 Altered mental status, unspecified: Secondary | ICD-10-CM | POA: Diagnosis not present

## 2017-07-08 DIAGNOSIS — E559 Vitamin D deficiency, unspecified: Secondary | ICD-10-CM | POA: Diagnosis not present

## 2017-07-08 DIAGNOSIS — M159 Polyosteoarthritis, unspecified: Secondary | ICD-10-CM | POA: Diagnosis not present

## 2017-07-08 DIAGNOSIS — L989 Disorder of the skin and subcutaneous tissue, unspecified: Secondary | ICD-10-CM | POA: Diagnosis not present

## 2017-07-08 DIAGNOSIS — R471 Dysarthria and anarthria: Secondary | ICD-10-CM | POA: Diagnosis not present

## 2017-07-08 DIAGNOSIS — I2510 Atherosclerotic heart disease of native coronary artery without angina pectoris: Secondary | ICD-10-CM | POA: Diagnosis not present

## 2017-07-08 DIAGNOSIS — R41841 Cognitive communication deficit: Secondary | ICD-10-CM | POA: Diagnosis not present

## 2017-07-08 DIAGNOSIS — R278 Other lack of coordination: Secondary | ICD-10-CM | POA: Diagnosis not present

## 2017-07-09 DIAGNOSIS — R278 Other lack of coordination: Secondary | ICD-10-CM | POA: Diagnosis not present

## 2017-07-09 DIAGNOSIS — M6281 Muscle weakness (generalized): Secondary | ICD-10-CM | POA: Diagnosis not present

## 2017-07-09 DIAGNOSIS — R2681 Unsteadiness on feet: Secondary | ICD-10-CM | POA: Diagnosis not present

## 2017-07-09 DIAGNOSIS — Z9181 History of falling: Secondary | ICD-10-CM | POA: Diagnosis not present

## 2017-07-09 DIAGNOSIS — R41841 Cognitive communication deficit: Secondary | ICD-10-CM | POA: Diagnosis not present

## 2017-07-12 DIAGNOSIS — M6281 Muscle weakness (generalized): Secondary | ICD-10-CM | POA: Diagnosis not present

## 2017-07-12 DIAGNOSIS — R278 Other lack of coordination: Secondary | ICD-10-CM | POA: Diagnosis not present

## 2017-07-12 DIAGNOSIS — R2681 Unsteadiness on feet: Secondary | ICD-10-CM | POA: Diagnosis not present

## 2017-07-12 DIAGNOSIS — R41841 Cognitive communication deficit: Secondary | ICD-10-CM | POA: Diagnosis not present

## 2017-07-12 DIAGNOSIS — Z9181 History of falling: Secondary | ICD-10-CM | POA: Diagnosis not present

## 2017-07-13 DIAGNOSIS — R41841 Cognitive communication deficit: Secondary | ICD-10-CM | POA: Diagnosis not present

## 2017-07-13 DIAGNOSIS — M6281 Muscle weakness (generalized): Secondary | ICD-10-CM | POA: Diagnosis not present

## 2017-07-13 DIAGNOSIS — R2681 Unsteadiness on feet: Secondary | ICD-10-CM | POA: Diagnosis not present

## 2017-07-13 DIAGNOSIS — Z9181 History of falling: Secondary | ICD-10-CM | POA: Diagnosis not present

## 2017-07-13 DIAGNOSIS — R278 Other lack of coordination: Secondary | ICD-10-CM | POA: Diagnosis not present

## 2017-07-14 DIAGNOSIS — R2681 Unsteadiness on feet: Secondary | ICD-10-CM | POA: Diagnosis not present

## 2017-07-14 DIAGNOSIS — Z9181 History of falling: Secondary | ICD-10-CM | POA: Diagnosis not present

## 2017-07-14 DIAGNOSIS — M6281 Muscle weakness (generalized): Secondary | ICD-10-CM | POA: Diagnosis not present

## 2017-07-14 DIAGNOSIS — R278 Other lack of coordination: Secondary | ICD-10-CM | POA: Diagnosis not present

## 2017-07-14 DIAGNOSIS — R41841 Cognitive communication deficit: Secondary | ICD-10-CM | POA: Diagnosis not present

## 2017-07-15 DIAGNOSIS — R41841 Cognitive communication deficit: Secondary | ICD-10-CM | POA: Diagnosis not present

## 2017-07-15 DIAGNOSIS — Z9181 History of falling: Secondary | ICD-10-CM | POA: Diagnosis not present

## 2017-07-15 DIAGNOSIS — R278 Other lack of coordination: Secondary | ICD-10-CM | POA: Diagnosis not present

## 2017-07-15 DIAGNOSIS — R2681 Unsteadiness on feet: Secondary | ICD-10-CM | POA: Diagnosis not present

## 2017-07-15 DIAGNOSIS — M6281 Muscle weakness (generalized): Secondary | ICD-10-CM | POA: Diagnosis not present

## 2017-07-16 DIAGNOSIS — R278 Other lack of coordination: Secondary | ICD-10-CM | POA: Diagnosis not present

## 2017-07-16 DIAGNOSIS — R2681 Unsteadiness on feet: Secondary | ICD-10-CM | POA: Diagnosis not present

## 2017-07-16 DIAGNOSIS — Z9181 History of falling: Secondary | ICD-10-CM | POA: Diagnosis not present

## 2017-07-16 DIAGNOSIS — M6281 Muscle weakness (generalized): Secondary | ICD-10-CM | POA: Diagnosis not present

## 2017-07-16 DIAGNOSIS — R41841 Cognitive communication deficit: Secondary | ICD-10-CM | POA: Diagnosis not present

## 2017-07-19 DIAGNOSIS — R41841 Cognitive communication deficit: Secondary | ICD-10-CM | POA: Diagnosis not present

## 2017-07-19 DIAGNOSIS — Z9181 History of falling: Secondary | ICD-10-CM | POA: Diagnosis not present

## 2017-07-19 DIAGNOSIS — R2681 Unsteadiness on feet: Secondary | ICD-10-CM | POA: Diagnosis not present

## 2017-07-19 DIAGNOSIS — M6281 Muscle weakness (generalized): Secondary | ICD-10-CM | POA: Diagnosis not present

## 2017-07-19 DIAGNOSIS — R278 Other lack of coordination: Secondary | ICD-10-CM | POA: Diagnosis not present

## 2017-07-20 DIAGNOSIS — R278 Other lack of coordination: Secondary | ICD-10-CM | POA: Diagnosis not present

## 2017-07-20 DIAGNOSIS — R2681 Unsteadiness on feet: Secondary | ICD-10-CM | POA: Diagnosis not present

## 2017-07-20 DIAGNOSIS — R41841 Cognitive communication deficit: Secondary | ICD-10-CM | POA: Diagnosis not present

## 2017-07-20 DIAGNOSIS — Z9181 History of falling: Secondary | ICD-10-CM | POA: Diagnosis not present

## 2017-07-20 DIAGNOSIS — M6281 Muscle weakness (generalized): Secondary | ICD-10-CM | POA: Diagnosis not present

## 2017-07-21 DIAGNOSIS — M6281 Muscle weakness (generalized): Secondary | ICD-10-CM | POA: Diagnosis not present

## 2017-07-21 DIAGNOSIS — Z9181 History of falling: Secondary | ICD-10-CM | POA: Diagnosis not present

## 2017-07-21 DIAGNOSIS — R278 Other lack of coordination: Secondary | ICD-10-CM | POA: Diagnosis not present

## 2017-07-21 DIAGNOSIS — R2681 Unsteadiness on feet: Secondary | ICD-10-CM | POA: Diagnosis not present

## 2017-07-21 DIAGNOSIS — R41841 Cognitive communication deficit: Secondary | ICD-10-CM | POA: Diagnosis not present

## 2017-07-22 DIAGNOSIS — R278 Other lack of coordination: Secondary | ICD-10-CM | POA: Diagnosis not present

## 2017-07-22 DIAGNOSIS — R41841 Cognitive communication deficit: Secondary | ICD-10-CM | POA: Diagnosis not present

## 2017-07-22 DIAGNOSIS — M6281 Muscle weakness (generalized): Secondary | ICD-10-CM | POA: Diagnosis not present

## 2017-07-22 DIAGNOSIS — R2681 Unsteadiness on feet: Secondary | ICD-10-CM | POA: Diagnosis not present

## 2017-07-22 DIAGNOSIS — Z9181 History of falling: Secondary | ICD-10-CM | POA: Diagnosis not present

## 2017-07-27 DIAGNOSIS — M6281 Muscle weakness (generalized): Secondary | ICD-10-CM | POA: Diagnosis not present

## 2017-07-27 DIAGNOSIS — R278 Other lack of coordination: Secondary | ICD-10-CM | POA: Diagnosis not present

## 2017-07-27 DIAGNOSIS — R2681 Unsteadiness on feet: Secondary | ICD-10-CM | POA: Diagnosis not present

## 2017-07-27 DIAGNOSIS — Z9181 History of falling: Secondary | ICD-10-CM | POA: Diagnosis not present

## 2017-07-27 DIAGNOSIS — R41841 Cognitive communication deficit: Secondary | ICD-10-CM | POA: Diagnosis not present

## 2017-07-28 DIAGNOSIS — M6281 Muscle weakness (generalized): Secondary | ICD-10-CM | POA: Diagnosis not present

## 2017-07-28 DIAGNOSIS — Z9181 History of falling: Secondary | ICD-10-CM | POA: Diagnosis not present

## 2017-07-28 DIAGNOSIS — R278 Other lack of coordination: Secondary | ICD-10-CM | POA: Diagnosis not present

## 2017-07-28 DIAGNOSIS — R2681 Unsteadiness on feet: Secondary | ICD-10-CM | POA: Diagnosis not present

## 2017-07-28 DIAGNOSIS — R41841 Cognitive communication deficit: Secondary | ICD-10-CM | POA: Diagnosis not present

## 2017-07-29 DIAGNOSIS — R278 Other lack of coordination: Secondary | ICD-10-CM | POA: Diagnosis not present

## 2017-07-29 DIAGNOSIS — Z9181 History of falling: Secondary | ICD-10-CM | POA: Diagnosis not present

## 2017-07-29 DIAGNOSIS — M6281 Muscle weakness (generalized): Secondary | ICD-10-CM | POA: Diagnosis not present

## 2017-07-29 DIAGNOSIS — R41841 Cognitive communication deficit: Secondary | ICD-10-CM | POA: Diagnosis not present

## 2017-07-29 DIAGNOSIS — R2681 Unsteadiness on feet: Secondary | ICD-10-CM | POA: Diagnosis not present

## 2017-07-30 DIAGNOSIS — M6281 Muscle weakness (generalized): Secondary | ICD-10-CM | POA: Diagnosis not present

## 2017-07-30 DIAGNOSIS — Z9181 History of falling: Secondary | ICD-10-CM | POA: Diagnosis not present

## 2017-07-30 DIAGNOSIS — R278 Other lack of coordination: Secondary | ICD-10-CM | POA: Diagnosis not present

## 2017-07-30 DIAGNOSIS — R2681 Unsteadiness on feet: Secondary | ICD-10-CM | POA: Diagnosis not present

## 2017-07-30 DIAGNOSIS — R41841 Cognitive communication deficit: Secondary | ICD-10-CM | POA: Diagnosis not present

## 2017-08-02 DIAGNOSIS — M6281 Muscle weakness (generalized): Secondary | ICD-10-CM | POA: Diagnosis not present

## 2017-08-02 DIAGNOSIS — Z9181 History of falling: Secondary | ICD-10-CM | POA: Diagnosis not present

## 2017-08-02 DIAGNOSIS — R2681 Unsteadiness on feet: Secondary | ICD-10-CM | POA: Diagnosis not present

## 2017-08-02 DIAGNOSIS — R278 Other lack of coordination: Secondary | ICD-10-CM | POA: Diagnosis not present

## 2017-08-02 DIAGNOSIS — R41841 Cognitive communication deficit: Secondary | ICD-10-CM | POA: Diagnosis not present

## 2017-08-03 DIAGNOSIS — R41841 Cognitive communication deficit: Secondary | ICD-10-CM | POA: Diagnosis not present

## 2017-08-03 DIAGNOSIS — M6281 Muscle weakness (generalized): Secondary | ICD-10-CM | POA: Diagnosis not present

## 2017-08-03 DIAGNOSIS — Z9181 History of falling: Secondary | ICD-10-CM | POA: Diagnosis not present

## 2017-08-03 DIAGNOSIS — R2681 Unsteadiness on feet: Secondary | ICD-10-CM | POA: Diagnosis not present

## 2017-08-03 DIAGNOSIS — R278 Other lack of coordination: Secondary | ICD-10-CM | POA: Diagnosis not present

## 2017-08-04 DIAGNOSIS — R2681 Unsteadiness on feet: Secondary | ICD-10-CM | POA: Diagnosis not present

## 2017-08-04 DIAGNOSIS — R278 Other lack of coordination: Secondary | ICD-10-CM | POA: Diagnosis not present

## 2017-08-04 DIAGNOSIS — R41841 Cognitive communication deficit: Secondary | ICD-10-CM | POA: Diagnosis not present

## 2017-08-04 DIAGNOSIS — M6281 Muscle weakness (generalized): Secondary | ICD-10-CM | POA: Diagnosis not present

## 2017-08-04 DIAGNOSIS — Z9181 History of falling: Secondary | ICD-10-CM | POA: Diagnosis not present

## 2017-08-05 DIAGNOSIS — R41841 Cognitive communication deficit: Secondary | ICD-10-CM | POA: Diagnosis not present

## 2017-08-31 DIAGNOSIS — M25551 Pain in right hip: Secondary | ICD-10-CM | POA: Diagnosis not present

## 2017-08-31 DIAGNOSIS — M545 Low back pain: Secondary | ICD-10-CM | POA: Diagnosis not present

## 2017-09-07 DIAGNOSIS — R262 Difficulty in walking, not elsewhere classified: Secondary | ICD-10-CM | POA: Diagnosis not present

## 2017-09-07 DIAGNOSIS — R2681 Unsteadiness on feet: Secondary | ICD-10-CM | POA: Diagnosis not present

## 2017-09-07 DIAGNOSIS — M6281 Muscle weakness (generalized): Secondary | ICD-10-CM | POA: Diagnosis not present

## 2017-09-07 DIAGNOSIS — M545 Low back pain: Secondary | ICD-10-CM | POA: Diagnosis not present

## 2017-09-07 DIAGNOSIS — Z9181 History of falling: Secondary | ICD-10-CM | POA: Diagnosis not present

## 2017-09-08 DIAGNOSIS — M6281 Muscle weakness (generalized): Secondary | ICD-10-CM | POA: Diagnosis not present

## 2017-09-08 DIAGNOSIS — Z9181 History of falling: Secondary | ICD-10-CM | POA: Diagnosis not present

## 2017-09-08 DIAGNOSIS — R2681 Unsteadiness on feet: Secondary | ICD-10-CM | POA: Diagnosis not present

## 2017-09-08 DIAGNOSIS — M545 Low back pain: Secondary | ICD-10-CM | POA: Diagnosis not present

## 2017-09-08 DIAGNOSIS — R262 Difficulty in walking, not elsewhere classified: Secondary | ICD-10-CM | POA: Diagnosis not present

## 2017-09-10 DIAGNOSIS — Z9181 History of falling: Secondary | ICD-10-CM | POA: Diagnosis not present

## 2017-09-10 DIAGNOSIS — M6281 Muscle weakness (generalized): Secondary | ICD-10-CM | POA: Diagnosis not present

## 2017-09-10 DIAGNOSIS — R2681 Unsteadiness on feet: Secondary | ICD-10-CM | POA: Diagnosis not present

## 2017-09-10 DIAGNOSIS — M545 Low back pain: Secondary | ICD-10-CM | POA: Diagnosis not present

## 2017-09-10 DIAGNOSIS — R262 Difficulty in walking, not elsewhere classified: Secondary | ICD-10-CM | POA: Diagnosis not present

## 2017-09-13 DIAGNOSIS — R262 Difficulty in walking, not elsewhere classified: Secondary | ICD-10-CM | POA: Diagnosis not present

## 2017-09-13 DIAGNOSIS — R2681 Unsteadiness on feet: Secondary | ICD-10-CM | POA: Diagnosis not present

## 2017-09-13 DIAGNOSIS — M545 Low back pain: Secondary | ICD-10-CM | POA: Diagnosis not present

## 2017-09-13 DIAGNOSIS — Z9181 History of falling: Secondary | ICD-10-CM | POA: Diagnosis not present

## 2017-09-13 DIAGNOSIS — M6281 Muscle weakness (generalized): Secondary | ICD-10-CM | POA: Diagnosis not present

## 2017-09-14 DIAGNOSIS — R2681 Unsteadiness on feet: Secondary | ICD-10-CM | POA: Diagnosis not present

## 2017-09-14 DIAGNOSIS — M545 Low back pain: Secondary | ICD-10-CM | POA: Diagnosis not present

## 2017-09-14 DIAGNOSIS — M6281 Muscle weakness (generalized): Secondary | ICD-10-CM | POA: Diagnosis not present

## 2017-09-14 DIAGNOSIS — R262 Difficulty in walking, not elsewhere classified: Secondary | ICD-10-CM | POA: Diagnosis not present

## 2017-09-14 DIAGNOSIS — Z9181 History of falling: Secondary | ICD-10-CM | POA: Diagnosis not present

## 2017-09-15 DIAGNOSIS — R2681 Unsteadiness on feet: Secondary | ICD-10-CM | POA: Diagnosis not present

## 2017-09-15 DIAGNOSIS — M545 Low back pain: Secondary | ICD-10-CM | POA: Diagnosis not present

## 2017-09-15 DIAGNOSIS — M6281 Muscle weakness (generalized): Secondary | ICD-10-CM | POA: Diagnosis not present

## 2017-09-15 DIAGNOSIS — R262 Difficulty in walking, not elsewhere classified: Secondary | ICD-10-CM | POA: Diagnosis not present

## 2017-09-15 DIAGNOSIS — Z9181 History of falling: Secondary | ICD-10-CM | POA: Diagnosis not present

## 2017-09-16 DIAGNOSIS — R262 Difficulty in walking, not elsewhere classified: Secondary | ICD-10-CM | POA: Diagnosis not present

## 2017-09-16 DIAGNOSIS — R2681 Unsteadiness on feet: Secondary | ICD-10-CM | POA: Diagnosis not present

## 2017-09-16 DIAGNOSIS — Z9181 History of falling: Secondary | ICD-10-CM | POA: Diagnosis not present

## 2017-09-16 DIAGNOSIS — M545 Low back pain: Secondary | ICD-10-CM | POA: Diagnosis not present

## 2017-09-16 DIAGNOSIS — M6281 Muscle weakness (generalized): Secondary | ICD-10-CM | POA: Diagnosis not present

## 2017-09-17 DIAGNOSIS — M545 Low back pain: Secondary | ICD-10-CM | POA: Diagnosis not present

## 2017-09-17 DIAGNOSIS — M6281 Muscle weakness (generalized): Secondary | ICD-10-CM | POA: Diagnosis not present

## 2017-09-17 DIAGNOSIS — Z9181 History of falling: Secondary | ICD-10-CM | POA: Diagnosis not present

## 2017-09-17 DIAGNOSIS — R262 Difficulty in walking, not elsewhere classified: Secondary | ICD-10-CM | POA: Diagnosis not present

## 2017-09-17 DIAGNOSIS — R2681 Unsteadiness on feet: Secondary | ICD-10-CM | POA: Diagnosis not present

## 2017-09-20 DIAGNOSIS — Z9181 History of falling: Secondary | ICD-10-CM | POA: Diagnosis not present

## 2017-09-20 DIAGNOSIS — R2681 Unsteadiness on feet: Secondary | ICD-10-CM | POA: Diagnosis not present

## 2017-09-20 DIAGNOSIS — R262 Difficulty in walking, not elsewhere classified: Secondary | ICD-10-CM | POA: Diagnosis not present

## 2017-09-20 DIAGNOSIS — M6281 Muscle weakness (generalized): Secondary | ICD-10-CM | POA: Diagnosis not present

## 2017-09-20 DIAGNOSIS — M545 Low back pain: Secondary | ICD-10-CM | POA: Diagnosis not present

## 2017-09-22 DIAGNOSIS — R262 Difficulty in walking, not elsewhere classified: Secondary | ICD-10-CM | POA: Diagnosis not present

## 2017-09-22 DIAGNOSIS — Z9181 History of falling: Secondary | ICD-10-CM | POA: Diagnosis not present

## 2017-09-22 DIAGNOSIS — M6281 Muscle weakness (generalized): Secondary | ICD-10-CM | POA: Diagnosis not present

## 2017-09-22 DIAGNOSIS — R2681 Unsteadiness on feet: Secondary | ICD-10-CM | POA: Diagnosis not present

## 2017-09-22 DIAGNOSIS — M545 Low back pain: Secondary | ICD-10-CM | POA: Diagnosis not present

## 2017-09-23 DIAGNOSIS — R262 Difficulty in walking, not elsewhere classified: Secondary | ICD-10-CM | POA: Diagnosis not present

## 2017-09-23 DIAGNOSIS — R2681 Unsteadiness on feet: Secondary | ICD-10-CM | POA: Diagnosis not present

## 2017-09-23 DIAGNOSIS — M6281 Muscle weakness (generalized): Secondary | ICD-10-CM | POA: Diagnosis not present

## 2017-09-23 DIAGNOSIS — Z9181 History of falling: Secondary | ICD-10-CM | POA: Diagnosis not present

## 2017-09-23 DIAGNOSIS — M545 Low back pain: Secondary | ICD-10-CM | POA: Diagnosis not present

## 2017-09-24 DIAGNOSIS — M545 Low back pain: Secondary | ICD-10-CM | POA: Diagnosis not present

## 2017-09-24 DIAGNOSIS — R2681 Unsteadiness on feet: Secondary | ICD-10-CM | POA: Diagnosis not present

## 2017-09-24 DIAGNOSIS — Z9181 History of falling: Secondary | ICD-10-CM | POA: Diagnosis not present

## 2017-09-24 DIAGNOSIS — R262 Difficulty in walking, not elsewhere classified: Secondary | ICD-10-CM | POA: Diagnosis not present

## 2017-09-24 DIAGNOSIS — M6281 Muscle weakness (generalized): Secondary | ICD-10-CM | POA: Diagnosis not present

## 2017-09-27 DIAGNOSIS — R2681 Unsteadiness on feet: Secondary | ICD-10-CM | POA: Diagnosis not present

## 2017-09-27 DIAGNOSIS — M545 Low back pain: Secondary | ICD-10-CM | POA: Diagnosis not present

## 2017-09-27 DIAGNOSIS — M6281 Muscle weakness (generalized): Secondary | ICD-10-CM | POA: Diagnosis not present

## 2017-09-27 DIAGNOSIS — R262 Difficulty in walking, not elsewhere classified: Secondary | ICD-10-CM | POA: Diagnosis not present

## 2017-09-27 DIAGNOSIS — Z9181 History of falling: Secondary | ICD-10-CM | POA: Diagnosis not present

## 2017-09-28 DIAGNOSIS — R2681 Unsteadiness on feet: Secondary | ICD-10-CM | POA: Diagnosis not present

## 2017-09-28 DIAGNOSIS — M6281 Muscle weakness (generalized): Secondary | ICD-10-CM | POA: Diagnosis not present

## 2017-09-28 DIAGNOSIS — Z9181 History of falling: Secondary | ICD-10-CM | POA: Diagnosis not present

## 2017-09-28 DIAGNOSIS — M545 Low back pain: Secondary | ICD-10-CM | POA: Diagnosis not present

## 2017-09-28 DIAGNOSIS — R262 Difficulty in walking, not elsewhere classified: Secondary | ICD-10-CM | POA: Diagnosis not present

## 2017-09-29 DIAGNOSIS — Z9181 History of falling: Secondary | ICD-10-CM | POA: Diagnosis not present

## 2017-09-29 DIAGNOSIS — M545 Low back pain: Secondary | ICD-10-CM | POA: Diagnosis not present

## 2017-09-29 DIAGNOSIS — R2681 Unsteadiness on feet: Secondary | ICD-10-CM | POA: Diagnosis not present

## 2017-09-29 DIAGNOSIS — M6281 Muscle weakness (generalized): Secondary | ICD-10-CM | POA: Diagnosis not present

## 2017-09-29 DIAGNOSIS — R262 Difficulty in walking, not elsewhere classified: Secondary | ICD-10-CM | POA: Diagnosis not present

## 2017-09-30 DIAGNOSIS — R262 Difficulty in walking, not elsewhere classified: Secondary | ICD-10-CM | POA: Diagnosis not present

## 2017-09-30 DIAGNOSIS — Z9181 History of falling: Secondary | ICD-10-CM | POA: Diagnosis not present

## 2017-09-30 DIAGNOSIS — M6281 Muscle weakness (generalized): Secondary | ICD-10-CM | POA: Diagnosis not present

## 2017-09-30 DIAGNOSIS — M545 Low back pain: Secondary | ICD-10-CM | POA: Diagnosis not present

## 2017-09-30 DIAGNOSIS — R2681 Unsteadiness on feet: Secondary | ICD-10-CM | POA: Diagnosis not present

## 2017-10-01 DIAGNOSIS — Z9181 History of falling: Secondary | ICD-10-CM | POA: Diagnosis not present

## 2017-10-01 DIAGNOSIS — M6281 Muscle weakness (generalized): Secondary | ICD-10-CM | POA: Diagnosis not present

## 2017-10-01 DIAGNOSIS — M545 Low back pain: Secondary | ICD-10-CM | POA: Diagnosis not present

## 2017-10-01 DIAGNOSIS — R2681 Unsteadiness on feet: Secondary | ICD-10-CM | POA: Diagnosis not present

## 2017-10-01 DIAGNOSIS — R262 Difficulty in walking, not elsewhere classified: Secondary | ICD-10-CM | POA: Diagnosis not present

## 2017-10-04 DIAGNOSIS — Z9181 History of falling: Secondary | ICD-10-CM | POA: Diagnosis not present

## 2017-10-04 DIAGNOSIS — M6281 Muscle weakness (generalized): Secondary | ICD-10-CM | POA: Diagnosis not present

## 2017-10-04 DIAGNOSIS — M545 Low back pain: Secondary | ICD-10-CM | POA: Diagnosis not present

## 2017-10-04 DIAGNOSIS — R262 Difficulty in walking, not elsewhere classified: Secondary | ICD-10-CM | POA: Diagnosis not present

## 2017-10-04 DIAGNOSIS — R2681 Unsteadiness on feet: Secondary | ICD-10-CM | POA: Diagnosis not present

## 2017-10-05 DIAGNOSIS — R2681 Unsteadiness on feet: Secondary | ICD-10-CM | POA: Diagnosis not present

## 2017-10-05 DIAGNOSIS — M545 Low back pain: Secondary | ICD-10-CM | POA: Diagnosis not present

## 2017-10-05 DIAGNOSIS — Z9181 History of falling: Secondary | ICD-10-CM | POA: Diagnosis not present

## 2017-10-05 DIAGNOSIS — R262 Difficulty in walking, not elsewhere classified: Secondary | ICD-10-CM | POA: Diagnosis not present

## 2017-10-05 DIAGNOSIS — M6281 Muscle weakness (generalized): Secondary | ICD-10-CM | POA: Diagnosis not present

## 2017-10-06 DIAGNOSIS — M6281 Muscle weakness (generalized): Secondary | ICD-10-CM | POA: Diagnosis not present

## 2017-10-06 DIAGNOSIS — R2681 Unsteadiness on feet: Secondary | ICD-10-CM | POA: Diagnosis not present

## 2017-10-06 DIAGNOSIS — R262 Difficulty in walking, not elsewhere classified: Secondary | ICD-10-CM | POA: Diagnosis not present

## 2017-10-06 DIAGNOSIS — M545 Low back pain: Secondary | ICD-10-CM | POA: Diagnosis not present

## 2017-10-06 DIAGNOSIS — Z9181 History of falling: Secondary | ICD-10-CM | POA: Diagnosis not present

## 2017-10-07 DIAGNOSIS — R262 Difficulty in walking, not elsewhere classified: Secondary | ICD-10-CM | POA: Diagnosis not present

## 2017-10-07 DIAGNOSIS — Z9181 History of falling: Secondary | ICD-10-CM | POA: Diagnosis not present

## 2017-10-07 DIAGNOSIS — M6281 Muscle weakness (generalized): Secondary | ICD-10-CM | POA: Diagnosis not present

## 2017-10-07 DIAGNOSIS — R2681 Unsteadiness on feet: Secondary | ICD-10-CM | POA: Diagnosis not present

## 2017-10-07 DIAGNOSIS — M545 Low back pain: Secondary | ICD-10-CM | POA: Diagnosis not present

## 2017-10-11 DIAGNOSIS — Z9181 History of falling: Secondary | ICD-10-CM | POA: Diagnosis not present

## 2017-10-11 DIAGNOSIS — M545 Low back pain: Secondary | ICD-10-CM | POA: Diagnosis not present

## 2017-10-11 DIAGNOSIS — R2681 Unsteadiness on feet: Secondary | ICD-10-CM | POA: Diagnosis not present

## 2017-10-11 DIAGNOSIS — R262 Difficulty in walking, not elsewhere classified: Secondary | ICD-10-CM | POA: Diagnosis not present

## 2017-10-11 DIAGNOSIS — M6281 Muscle weakness (generalized): Secondary | ICD-10-CM | POA: Diagnosis not present

## 2017-10-12 DIAGNOSIS — M6281 Muscle weakness (generalized): Secondary | ICD-10-CM | POA: Diagnosis not present

## 2017-10-12 DIAGNOSIS — Z9181 History of falling: Secondary | ICD-10-CM | POA: Diagnosis not present

## 2017-10-12 DIAGNOSIS — E039 Hypothyroidism, unspecified: Secondary | ICD-10-CM | POA: Diagnosis not present

## 2017-10-12 DIAGNOSIS — M159 Polyosteoarthritis, unspecified: Secondary | ICD-10-CM | POA: Diagnosis not present

## 2017-10-12 DIAGNOSIS — R2681 Unsteadiness on feet: Secondary | ICD-10-CM | POA: Diagnosis not present

## 2017-10-12 DIAGNOSIS — E559 Vitamin D deficiency, unspecified: Secondary | ICD-10-CM | POA: Diagnosis not present

## 2017-10-12 DIAGNOSIS — Z79899 Other long term (current) drug therapy: Secondary | ICD-10-CM | POA: Diagnosis not present

## 2017-10-12 DIAGNOSIS — R262 Difficulty in walking, not elsewhere classified: Secondary | ICD-10-CM | POA: Diagnosis not present

## 2017-10-12 DIAGNOSIS — R4189 Other symptoms and signs involving cognitive functions and awareness: Secondary | ICD-10-CM | POA: Diagnosis not present

## 2017-10-12 DIAGNOSIS — M545 Low back pain: Secondary | ICD-10-CM | POA: Diagnosis not present

## 2017-10-14 DIAGNOSIS — R262 Difficulty in walking, not elsewhere classified: Secondary | ICD-10-CM | POA: Diagnosis not present

## 2017-10-14 DIAGNOSIS — R2681 Unsteadiness on feet: Secondary | ICD-10-CM | POA: Diagnosis not present

## 2017-10-14 DIAGNOSIS — Z9181 History of falling: Secondary | ICD-10-CM | POA: Diagnosis not present

## 2017-10-14 DIAGNOSIS — M6281 Muscle weakness (generalized): Secondary | ICD-10-CM | POA: Diagnosis not present

## 2017-10-14 DIAGNOSIS — M545 Low back pain: Secondary | ICD-10-CM | POA: Diagnosis not present

## 2017-10-18 DIAGNOSIS — M6281 Muscle weakness (generalized): Secondary | ICD-10-CM | POA: Diagnosis not present

## 2017-10-18 DIAGNOSIS — R262 Difficulty in walking, not elsewhere classified: Secondary | ICD-10-CM | POA: Diagnosis not present

## 2017-10-18 DIAGNOSIS — Z9181 History of falling: Secondary | ICD-10-CM | POA: Diagnosis not present

## 2017-10-18 DIAGNOSIS — R2681 Unsteadiness on feet: Secondary | ICD-10-CM | POA: Diagnosis not present

## 2017-10-18 DIAGNOSIS — M545 Low back pain: Secondary | ICD-10-CM | POA: Diagnosis not present

## 2017-10-19 DIAGNOSIS — Z9181 History of falling: Secondary | ICD-10-CM | POA: Diagnosis not present

## 2017-10-19 DIAGNOSIS — M6281 Muscle weakness (generalized): Secondary | ICD-10-CM | POA: Diagnosis not present

## 2017-10-19 DIAGNOSIS — R2681 Unsteadiness on feet: Secondary | ICD-10-CM | POA: Diagnosis not present

## 2017-10-19 DIAGNOSIS — M545 Low back pain: Secondary | ICD-10-CM | POA: Diagnosis not present

## 2017-10-19 DIAGNOSIS — R262 Difficulty in walking, not elsewhere classified: Secondary | ICD-10-CM | POA: Diagnosis not present

## 2017-11-01 ENCOUNTER — Other Ambulatory Visit: Payer: Self-pay

## 2017-11-01 ENCOUNTER — Encounter (HOSPITAL_COMMUNITY): Payer: Self-pay

## 2017-11-01 ENCOUNTER — Emergency Department (HOSPITAL_COMMUNITY): Payer: Medicare Other

## 2017-11-01 ENCOUNTER — Emergency Department (HOSPITAL_COMMUNITY)
Admission: EM | Admit: 2017-11-01 | Discharge: 2017-11-02 | Disposition: A | Discharge status: Home / Self Care | Payer: Medicare Other | Attending: Emergency Medicine | Admitting: Emergency Medicine

## 2017-11-01 DIAGNOSIS — Y999 Unspecified external cause status: Secondary | ICD-10-CM | POA: Insufficient documentation

## 2017-11-01 DIAGNOSIS — S0990XA Unspecified injury of head, initial encounter: Secondary | ICD-10-CM | POA: Diagnosis not present

## 2017-11-01 DIAGNOSIS — S0093XA Contusion of unspecified part of head, initial encounter: Secondary | ICD-10-CM | POA: Insufficient documentation

## 2017-11-01 DIAGNOSIS — I2510 Atherosclerotic heart disease of native coronary artery without angina pectoris: Secondary | ICD-10-CM | POA: Diagnosis not present

## 2017-11-01 DIAGNOSIS — W19XXXA Unspecified fall, initial encounter: Secondary | ICD-10-CM

## 2017-11-01 DIAGNOSIS — S2241XA Multiple fractures of ribs, right side, initial encounter for closed fracture: Secondary | ICD-10-CM | POA: Diagnosis not present

## 2017-11-01 DIAGNOSIS — W228XXA Striking against or struck by other objects, initial encounter: Secondary | ICD-10-CM | POA: Insufficient documentation

## 2017-11-01 DIAGNOSIS — J449 Chronic obstructive pulmonary disease, unspecified: Secondary | ICD-10-CM | POA: Insufficient documentation

## 2017-11-01 DIAGNOSIS — R220 Localized swelling, mass and lump, head: Secondary | ICD-10-CM | POA: Diagnosis not present

## 2017-11-01 DIAGNOSIS — Z79899 Other long term (current) drug therapy: Secondary | ICD-10-CM | POA: Insufficient documentation

## 2017-11-01 DIAGNOSIS — Y9301 Activity, walking, marching and hiking: Secondary | ICD-10-CM | POA: Diagnosis not present

## 2017-11-01 DIAGNOSIS — Y92129 Unspecified place in nursing home as the place of occurrence of the external cause: Secondary | ICD-10-CM | POA: Diagnosis not present

## 2017-11-01 DIAGNOSIS — I10 Essential (primary) hypertension: Secondary | ICD-10-CM | POA: Diagnosis not present

## 2017-11-01 DIAGNOSIS — S199XXA Unspecified injury of neck, initial encounter: Secondary | ICD-10-CM | POA: Diagnosis not present

## 2017-11-01 DIAGNOSIS — M545 Low back pain: Secondary | ICD-10-CM | POA: Diagnosis not present

## 2017-11-01 NOTE — ED Provider Notes (Signed)
[facility] DEPT Provider Note   CSN: [id number] Arrival date & time: 11/01/17  2112     History   Chief Complaint Chief Complaint  Patient presents with  . Fall    HPI Alice Hayes is a 84 y.o. female.  HPI 84 year old Caucasian female past medical history significant for COPD, hypertension, congestive heart failure, CAD, dementia presents to the emergency department today from nursing facility with family at bedside after having a witnessed fall.  I spoke to the facility who states that patient was walking with another nursing aide when she tripped and fell forward hitting her head.  Denies LOC.  Patient was ambulatory after the event.  Patient denies any pain at this time.  They did note a hematoma to the side of her head.  Patient denies any headache, vision changes, lightheadedness or dizziness.  Denies any neck pain or back pain.  Denies any chest pain, abdominal pain.  Denies any paresthesias.  Patient is in the memory care unit.  She does have a history of frequent falls.  Daughter states that she is at baseline.   Past Medical History:  Diagnosis Date  . CAD (coronary artery disease)    nonobstructive cath 2006  . Cholelithiases   . COPD (chronic obstructive pulmonary disease) ([place])   . Diastolic heart failure ([place])   . HTN (hypertension)   . Hyperlipidemia   . Melanoma ([place])   . Osteoarthritis   . Osteoporosis     Patient Active Problem List   Diagnosis Date Noted  . Dyspnea 11/23/2013    Past Surgical History:  Procedure Laterality Date  . APPENDECTOMY    . BLADDER SUSPENSION     2006  . BREAST LUMPECTOMY    . CARPAL TUNNEL RELEASE     2007  . CATARACT EXTRACTION    . LYMPH NODE BIOPSY    . MELANOMA EXCISION     2008  . PAROTIDECTOMY     1959  . TONSILLECTOMY AND ADENOIDECTOMY       OB History   None      Home Medications    Prior to Admission medications   Medication Sig Start Date End Date Taking?  Authorizing Provider  aspirin-sod bicarb-citric acid (ALKA-SELTZER ORIGINAL) 325 MG TBEF tablet Take 650 mg by mouth daily as needed (osteoarthritis aches and pain).   Yes [provider]  Calcium Carbonate-Vitamin D (CALCIUM-VITAMIN D3 PO) Take 1 tablet by mouth daily. Calcium 600 mg + D3 200IU   Yes [provider]  cholecalciferol (VITAMIN D) 1000 units tablet Take 1,000 Units by mouth daily.   Yes [provider]  citalopram (CELEXA) 20 MG tablet Take 20 mg by mouth daily.   Yes [provider]  CO ENZYME Q-10 PO Take 1 capsule by mouth daily. Co Q-10 10 mg capsule   Yes [provider]  HYDROcodone-acetaminophen (NORCO/VICODIN) 5-325 MG tablet Take 0.5 tablets by mouth every 12 (twelve) hours as needed for moderate pain.   Yes [provider]  Multiple Vitamins-Minerals (ICAPS) CAPS Take 1 capsule by mouth daily.    Yes [provider]  thyroid (ARMOUR THYROID) 60 MG tablet Take 60 mg by mouth daily before breakfast.    Yes [provider]    Family History Family History  Problem Relation Age of Onset  . CAD Father        Sudden death age 59  . CAD Son 39       MI.  Alive in his 57s  . Cancer Daughter        Breast  . Pancreatitis Daughter        Severe episode    Social History Social History   Tobacco Use  . Smoking status: Never Smoker  . Smokeless tobacco: Never Used  Substance Use Topics  . Alcohol use: Not on file  . Drug use: Not on file     Allergies   Voltaren ophthalmic [diclofenac]   Review of Systems Review of Systems  All other systems reviewed and are negative.    Physical Exam Updated Vital Signs BP (!) 153/84 (BP Location: Left Arm)   Pulse 82   Temp 98.3 F (36.8 C) (Oral)   Resp (!) 27   Ht 4\' 11"  (1.499 m)   Wt 54.9 kg (121 lb)   SpO2 91%   BMI 24.44 kg/m   Physical Exam  Constitutional: She appears well-developed and well-nourished. No distress.  HENT:  Head:  Normocephalic and atraumatic.  No skull depression.  No appreciable hematoma.  No bilateral symptoms.  No septal hematoma.  Eyes: Right eye exhibits no discharge. Left eye exhibits no discharge. No scleral icterus.  Neck: Normal range of motion. Neck supple.  No c spine midline tenderness. No paraspinal tenderness. No deformities or step offs noted. Full ROM. Supple. No nuchal rigidity.     Cardiovascular: Normal rate, regular rhythm, normal heart sounds and intact distal pulses. Exam reveals no gallop and no friction rub.  No murmur heard. Pulmonary/Chest: Effort normal and breath sounds normal. No stridor. No respiratory distress. She has no wheezes. She has no rales. She exhibits tenderness.    Abdominal: Soft. Bowel sounds are normal. She exhibits no distension. There is no tenderness. There is no rebound and no guarding.  No ecchymosis noted.  Musculoskeletal: Normal range of motion.  No midline T spine or L spine tenderness. No deformities or step offs noted. Full ROM. Pelvis is stable.   Neurological: She is alert.  Patient is alert to person and place but not time which is baseline for family at bedside.  Patient moves all extremities any difficulties.  Follows commands appropriately.  Cranial nerves II through XII grossly intact.  The patient has normal sensation and strength throughout.  Finger-to-nose.  Skin: Skin is warm and dry. Capillary refill takes less than 2 seconds. No pallor.  Psychiatric: Her behavior is normal. Judgment and thought content normal.  Nursing note and vitals reviewed.    ED Treatments / Results  Labs (all labs ordered are listed, but only abnormal results are displayed) Labs Reviewed - No data to display  EKG None  Radiology Dg Ribs Unilateral W/chest Right  Result Date: 11/02/2017 CLINICAL DATA:  Fall, right rib and back pain. EXAM: RIGHT RIBS AND CHEST - 3+ VIEW COMPARISON:  Chest radiograph April 06, 2015 FINDINGS: The cardiac silhouette is  similarly enlarged very calcified aortic arch. Left lung base scarring. No pleural effusion or focal consolidation. No pneumothorax. Sclerosis left humeral head compatible with enchondroma. Acute nondisplaced right anterior eighth and ninth rib fractures. Osteopenia. New age indeterminate L1 compression fracture. IMPRESSION: 1. Acute nondisplaced right eighth and ninth rib fractures. 2. Stable cardiomegaly.  No acute pulmonary process. 3. New age indeterminate L1 compression fracture. 4.  Aortic Atherosclerosis (ICD10-I70.0). Electronically Signed   By: Elon Alas M.D.   On: 11/02/2017 01:34   Dg Lumbar Spine Complete  Result Date: 11/02/2017 CLINICAL DATA:  Fall with lumbosacral back pain. EXAM: LUMBAR  SPINE - COMPLETE 4+ VIEW COMPARISON:  Radiographs 08/31/2017 FINDINGS: Progressive loss of height of L1 vertebral body since prior exam involving both anterior and posterior cortex, greater than 50% anteriorly. Chronic T12 compression fracture is stable from prior exam. Remaining lumbar vertebral body heights are preserved. Trace anterolisthesis of L4 on L5. Multilevel facet arthropathy. The bones are under mineralized. Dense atherosclerosis of abdominal vasculature. IMPRESSION: 1. Compression fracture of L1 with progressive loss of height since radiograph 2 months ago, greater than 50% anteriorly. 2. Chronic T12 compression fracture. 3. Multilevel facet arthropathy. Minimal anterolisthesis of L4 on L5 is unchanged. Electronically Signed   By: Jeb Levering M.D.   On: 11/02/2017 01:36   Ct Head Wo Contrast  Result Date: 11/01/2017 CLINICAL DATA:  Status post fall, hitting back of head. Scalp swelling. Concern for cervical spine injury. EXAM: CT HEAD WITHOUT CONTRAST CT CERVICAL SPINE WITHOUT CONTRAST TECHNIQUE: Multidetector CT imaging of the head and cervical spine was performed following the standard protocol without intravenous contrast. Multiplanar CT image reconstructions of the cervical spine  were also generated. COMPARISON:  CT of the head and cervical spine performed 05/26/2017, and MRI of the brain performed 04/06/2015 FINDINGS: CT HEAD FINDINGS Brain: No evidence of acute infarction, hemorrhage, hydrocephalus, extra-axial collection or mass lesion / mass effect. Prominence of the ventricles and sulci reflects moderately severe cortical volume loss. Mild cerebellar atrophy is noted. Scattered periventricular and subcortical white matter change likely reflects small vessel ischemic microangiopathy. The brainstem and fourth ventricle are within normal limits. The basal ganglia are unremarkable in appearance. The cerebral hemispheres demonstrate grossly normal gray-white differentiation. No mass effect or midline shift is seen. Vascular: No hyperdense vessel or unexpected calcification. Skull: There is no evidence of fracture; visualized osseous structures are unremarkable in appearance. Sinuses/Orbits: The orbits are within normal limits. The paranasal sinuses and mastoid air cells are well-aerated. Other: Mild soft tissue swelling is noted at the posterior vertex. CT CERVICAL SPINE FINDINGS Alignment: There is grade 1 anterolisthesis of C6 on C7. Skull base and vertebrae: No acute fracture. No primary bone lesion or focal pathologic process. Soft tissues and spinal canal: No prevertebral fluid or swelling. No visible canal hematoma. Disc levels: Intervertebral disc space narrowing is noted at C5-C6. Vacuum phenomenon is noted along the lower cervical spine, with anterior and posterior disc osteophyte complexes. Upper chest: Minimal scarring is noted at the lung apices. Small nodules at the lung apices measure up to 4 mm in size. The thyroid gland is unremarkable in appearance. Other: No additional soft tissue abnormalities are seen. IMPRESSION: 1. No evidence of traumatic intracranial injury or fracture. 2. No evidence of fracture or subluxation along the cervical spine. 3. Mild soft tissue swelling at  the posterior vertex. 4. Moderately severe cortical volume loss and scattered small vessel ischemic microangiopathy. 5. Mild degenerative change noted along the cervical spine. 6. Small nodules at the lung apices measure up to 4 mm in size. No follow-up needed if patient is low-risk (and has no known or suspected primary neoplasm). Non-contrast chest CT can be considered in 12 months if patient is high-risk. This recommendation follows the consensus statement: Guidelines for Management of Incidental Pulmonary Nodules Detected on CT Images: From the Fleischner Society 2017; Radiology 2017; 284:228-243. 7. Minimal scarring at the lung apices. Electronically Signed   By: Garald Balding M.D.   On: 11/01/2017 23:29   Ct Cervical Spine Wo Contrast  Result Date: 11/01/2017 CLINICAL DATA:  Status post fall, hitting back of  head. Scalp swelling. Concern for cervical spine injury. EXAM: CT HEAD WITHOUT CONTRAST CT CERVICAL SPINE WITHOUT CONTRAST TECHNIQUE: Multidetector CT imaging of the head and cervical spine was performed following the standard protocol without intravenous contrast. Multiplanar CT image reconstructions of the cervical spine were also generated. COMPARISON:  CT of the head and cervical spine performed 05/26/2017, and MRI of the brain performed 04/06/2015 FINDINGS: CT HEAD FINDINGS Brain: No evidence of acute infarction, hemorrhage, hydrocephalus, extra-axial collection or mass lesion / mass effect. Prominence of the ventricles and sulci reflects moderately severe cortical volume loss. Mild cerebellar atrophy is noted. Scattered periventricular and subcortical white matter change likely reflects small vessel ischemic microangiopathy. The brainstem and fourth ventricle are within normal limits. The basal ganglia are unremarkable in appearance. The cerebral hemispheres demonstrate grossly normal gray-white differentiation. No mass effect or midline shift is seen. Vascular: No hyperdense vessel or unexpected  calcification. Skull: There is no evidence of fracture; visualized osseous structures are unremarkable in appearance. Sinuses/Orbits: The orbits are within normal limits. The paranasal sinuses and mastoid air cells are well-aerated. Other: Mild soft tissue swelling is noted at the posterior vertex. CT CERVICAL SPINE FINDINGS Alignment: There is grade 1 anterolisthesis of C6 on C7. Skull base and vertebrae: No acute fracture. No primary bone lesion or focal pathologic process. Soft tissues and spinal canal: No prevertebral fluid or swelling. No visible canal hematoma. Disc levels: Intervertebral disc space narrowing is noted at C5-C6. Vacuum phenomenon is noted along the lower cervical spine, with anterior and posterior disc osteophyte complexes. Upper chest: Minimal scarring is noted at the lung apices. Small nodules at the lung apices measure up to 4 mm in size. The thyroid gland is unremarkable in appearance. Other: No additional soft tissue abnormalities are seen. IMPRESSION: 1. No evidence of traumatic intracranial injury or fracture. 2. No evidence of fracture or subluxation along the cervical spine. 3. Mild soft tissue swelling at the posterior vertex. 4. Moderately severe cortical volume loss and scattered small vessel ischemic microangiopathy. 5. Mild degenerative change noted along the cervical spine. 6. Small nodules at the lung apices measure up to 4 mm in size. No follow-up needed if patient is low-risk (and has no known or suspected primary neoplasm). Non-contrast chest CT can be considered in 12 months if patient is high-risk. This recommendation follows the consensus statement: Guidelines for Management of Incidental Pulmonary Nodules Detected on CT Images: From the Fleischner Society 2017; Radiology 2017; 284:228-243. 7. Minimal scarring at the lung apices. Electronically Signed   By: Garald Balding M.D.   On: 11/01/2017 23:29    Procedures Procedures (including critical care time)  Medications  Ordered in ED Medications - No data to display   Initial Impression / Assessment and Plan / ED Course  I have reviewed the triage vital signs and the nursing notes.  Pertinent labs & imaging results that were available during my care of the patient were reviewed by me and considered in my medical decision making (see chart for details).     Patient presents to the ED after a witnessed fall at nursing facility.  Daughter at bedside.  Patient complains of hematoma to the head and some right-sided rib pain.  Patient denies LOC.  She is at her baseline per daughter.  No blood thinner use.  Vital signs reassuring.  Patient has no signs of intrathoracic or intra-abdominal trauma.  Her lungs are clear to auscultation bilaterally.  Patient at baseline per daughter.  Neurovascularly intact in all  extremities.  Follows commands appropriately.  CT imaging of head and cervical spine revealed no acute findings.  Does note pulmonary nodules that patient was not aware of.  Patient states that she has no history of cancer, smoking history.  We will follow-up with her primary care doctor concerning this.  Given patient's tenderness over her rib cage additional imaging was ordered.  X-rays show acute fractures of right eighth and ninth rib.  Also notes possible compression fracture of L1 age-indeterminate.  Patient does not have any pain over the lumbar thoracic spine.  No deformity or step-offs noted.  I doubt this is from the fall this evening given the patient has had several recent falls.  Prior imaging was reviewed that shows possible compression fractures in the past.  This will need to probably followed up with her primary care doctor and orthopedic doctor.  Patient has no focal neuro deficit on exam.  Patient does have 2 rib fractures.  Patient's saturations are in the mid 90s.  No significant tachypnea noted.  No increased work of breathing.  Patient will be given incentive spirometer.  I discussed with  daughter at bedside about pain medication for patient.  Given patient's age concern for narcotic pain medication and increased fall risk however I am also concerned that the pain is not adequately managed patient will develop possible pneumonia.  Will give short course of tramadol.  I encouraged daughter to use tramadol, Tylenol and Motrin for pain.  If the tramadol is not helping patient can be given a small dose of the hydrocodone.  I instructed the family to not give this medication together.  Patient's pain managed in the ED with Tylenol.  She appears to be in no acute distress with discharge.  No indications for admission at this time.  I am reassured the patient is going back to a nursing facility that can monitor patient very closely.  Pt is hemodynamically stable, in NAD, & able to ambulate in the ED. Evaluation does not show pathology that would require ongoing emergent intervention or inpatient treatment. I explained the diagnosis to the patient. Pain has been managed & has no complaints prior to dc. Pt is comfortable with above plan and is stable for discharge at this time. All questions were answered prior to disposition. Strict return precautions for f/u to the ED were discussed. Encouraged follow up with PCP.  Pt seen and eval by my attending who is agreeable with the above plna.   Final Clinical Impressions(s) / ED Diagnoses   Final diagnoses:  Fall, initial encounter  Contusion of head, unspecified part of head, initial encounter  Closed fracture of multiple ribs of right side, initial encounter    ED Discharge Orders        Ordered    traMADol (ULTRAM) 50 MG tablet  Every 6 hours PRN     11/02/17 0155    HYDROcodone-acetaminophen (NORCO) 5-325 MG tablet  Every 6 hours PRN     11/02/17 0155       Doristine Devoid, PA-C 01/13/47 1856    Delora Fuel, MD 31/49/70 [phone]

## 2017-11-01 NOTE — ED Triage Notes (Signed)
Per EMS, pt fell at nursing home and hit the back of her head. Per EMS, pt has a bump on her head where she fell, denies bleeding.EMS is unsure of LOC occurred.

## 2017-11-02 ENCOUNTER — Emergency Department (HOSPITAL_COMMUNITY): Payer: Medicare Other

## 2017-11-02 DIAGNOSIS — M545 Low back pain: Secondary | ICD-10-CM | POA: Diagnosis not present

## 2017-11-02 DIAGNOSIS — S2241XA Multiple fractures of ribs, right side, initial encounter for closed fracture: Secondary | ICD-10-CM | POA: Diagnosis not present

## 2017-11-02 DIAGNOSIS — Z7401 Bed confinement status: Secondary | ICD-10-CM | POA: Diagnosis not present

## 2017-11-02 DIAGNOSIS — M2550 Pain in unspecified joint: Secondary | ICD-10-CM | POA: Diagnosis not present

## 2017-11-02 DIAGNOSIS — S0093XA Contusion of unspecified part of head, initial encounter: Secondary | ICD-10-CM | POA: Diagnosis not present

## 2017-11-02 MED ORDER — TRAMADOL HCL 50 MG PO TABS: 50.0000 mg | ORAL_TABLET | Freq: Four times a day (QID) | ORAL | 0 refills | Status: DC | PRN

## 2017-11-02 MED ORDER — HYDROCODONE-ACETAMINOPHEN 5-325 MG PO TABS: 1.0000 | ORAL_TABLET | Freq: Four times a day (QID) | ORAL | 0 refills | Status: DC | PRN

## 2017-11-02 MED ORDER — ACETAMINOPHEN 325 MG PO TABS
650.0000 mg | ORAL_TABLET | Freq: Once | ORAL | Status: AC
  Administered 2017-11-02: 650 mg via ORAL
  Filled 2017-11-02: qty 2

## 2017-11-02 NOTE — ED Notes (Signed)
Pt unable to sign for discharge 

## 2017-11-02 NOTE — Discharge Instructions (Addendum)
She does have 2 rib fractures.  Have given her a breathing machine to use several times every hour while awake to prevent pneumonia.  These are painful.  Patient will need pain medication however given her age and fall risk this can make her falling worse.  Have given 2 prescriptions.  The one prescription is tramadol.  I would use this medication along with Motrin and Tylenol.  If patient pain is not controlled the tramadol she may discontinue the tramadol and Tylenol and add on the hydrocodone.  Again these are high risk for falls and sedation so make sure patient is closely monitored.  She will need close follow-up with a primary care doctor.  If she develops any fevers, cough, shortness of breath or worsening pain return the ED.

## 2017-11-02 NOTE — ED Notes (Signed)
Per EDP, tried to have pt stand. Pt was able to get to a sitting position and then requested to be laid back down due to pain in ribs. EDP notified.

## 2017-11-02 NOTE — ED Notes (Signed)
PTAR contacted for discharge back to [facility].

## 2017-11-02 NOTE — ED Notes (Signed)
RT paged for incentive spirometry.

## 2017-11-24 DIAGNOSIS — R319 Hematuria, unspecified: Secondary | ICD-10-CM | POA: Diagnosis not present

## 2017-11-25 DIAGNOSIS — R319 Hematuria, unspecified: Secondary | ICD-10-CM | POA: Diagnosis not present

## 2017-12-01 ENCOUNTER — Other Ambulatory Visit: Payer: Self-pay

## 2017-12-01 ENCOUNTER — Inpatient Hospital Stay (HOSPITAL_COMMUNITY)
Admission: EM | Admit: 2017-12-01 | Discharge: 2017-12-03 | DRG: 535 | Disposition: A | Discharge status: Home Health | Payer: Medicare Other | Attending: Internal Medicine | Admitting: Internal Medicine

## 2017-12-01 ENCOUNTER — Emergency Department (HOSPITAL_COMMUNITY): Payer: Medicare Other

## 2017-12-01 ENCOUNTER — Encounter (HOSPITAL_COMMUNITY): Payer: Self-pay | Admitting: Physician Assistant

## 2017-12-01 DIAGNOSIS — Z79899 Other long term (current) drug therapy: Secondary | ICD-10-CM | POA: Diagnosis not present

## 2017-12-01 DIAGNOSIS — S32415A Nondisplaced fracture of anterior wall of left acetabulum, initial encounter for closed fracture: Secondary | ICD-10-CM | POA: Diagnosis not present

## 2017-12-01 DIAGNOSIS — Z7401 Bed confinement status: Secondary | ICD-10-CM | POA: Diagnosis not present

## 2017-12-01 DIAGNOSIS — E785 Hyperlipidemia, unspecified: Secondary | ICD-10-CM

## 2017-12-01 DIAGNOSIS — Z85820 Personal history of malignant melanoma of skin: Secondary | ICD-10-CM

## 2017-12-01 DIAGNOSIS — R0902 Hypoxemia: Secondary | ICD-10-CM

## 2017-12-01 DIAGNOSIS — I5032 Chronic diastolic (congestive) heart failure: Secondary | ICD-10-CM | POA: Diagnosis present

## 2017-12-01 DIAGNOSIS — W19XXXA Unspecified fall, initial encounter: Secondary | ICD-10-CM

## 2017-12-01 DIAGNOSIS — J449 Chronic obstructive pulmonary disease, unspecified: Secondary | ICD-10-CM | POA: Diagnosis present

## 2017-12-01 DIAGNOSIS — Z888 Allergy status to other drugs, medicaments and biological substances status: Secondary | ICD-10-CM | POA: Diagnosis not present

## 2017-12-01 DIAGNOSIS — M810 Age-related osteoporosis without current pathological fracture: Secondary | ICD-10-CM | POA: Diagnosis present

## 2017-12-01 DIAGNOSIS — M1990 Unspecified osteoarthritis, unspecified site: Secondary | ICD-10-CM | POA: Diagnosis present

## 2017-12-01 DIAGNOSIS — Z66 Do not resuscitate: Secondary | ICD-10-CM | POA: Diagnosis present

## 2017-12-01 DIAGNOSIS — I110 Hypertensive heart disease with heart failure: Secondary | ICD-10-CM | POA: Diagnosis present

## 2017-12-01 DIAGNOSIS — J9801 Acute bronchospasm: Secondary | ICD-10-CM | POA: Diagnosis not present

## 2017-12-01 DIAGNOSIS — S32512A Fracture of superior rim of left pubis, initial encounter for closed fracture: Secondary | ICD-10-CM

## 2017-12-01 DIAGNOSIS — S32591A Other specified fracture of right pubis, initial encounter for closed fracture: Secondary | ICD-10-CM | POA: Diagnosis not present

## 2017-12-01 DIAGNOSIS — F0390 Unspecified dementia without behavioral disturbance: Secondary | ICD-10-CM

## 2017-12-01 DIAGNOSIS — S32592A Other specified fracture of left pubis, initial encounter for closed fracture: Secondary | ICD-10-CM | POA: Diagnosis not present

## 2017-12-01 DIAGNOSIS — S32435A Nondisplaced fracture of anterior column [iliopubic] of left acetabulum, initial encounter for closed fracture: Secondary | ICD-10-CM | POA: Diagnosis present

## 2017-12-01 DIAGNOSIS — E039 Hypothyroidism, unspecified: Secondary | ICD-10-CM | POA: Diagnosis present

## 2017-12-01 DIAGNOSIS — I2510 Atherosclerotic heart disease of native coronary artery without angina pectoris: Secondary | ICD-10-CM | POA: Diagnosis present

## 2017-12-01 DIAGNOSIS — H9190 Unspecified hearing loss, unspecified ear: Secondary | ICD-10-CM | POA: Diagnosis present

## 2017-12-01 DIAGNOSIS — F0280 Dementia in other diseases classified elsewhere without behavioral disturbance: Secondary | ICD-10-CM | POA: Diagnosis present

## 2017-12-01 DIAGNOSIS — G309 Alzheimer's disease, unspecified: Secondary | ICD-10-CM | POA: Diagnosis present

## 2017-12-01 DIAGNOSIS — S32599A Other specified fracture of unspecified pubis, initial encounter for closed fracture: Secondary | ICD-10-CM

## 2017-12-01 DIAGNOSIS — J9811 Atelectasis: Secondary | ICD-10-CM | POA: Diagnosis not present

## 2017-12-01 DIAGNOSIS — M2550 Pain in unspecified joint: Secondary | ICD-10-CM | POA: Diagnosis not present

## 2017-12-01 DIAGNOSIS — I10 Essential (primary) hypertension: Secondary | ICD-10-CM

## 2017-12-01 HISTORY — DX: Other specified fracture of unspecified pubis, initial encounter for closed fracture: S32.599A

## 2017-12-01 LAB — CBC WITH DIFFERENTIAL/PLATELET
ABS IMMATURE GRANULOCYTES: 0.1 10*3/uL (ref 0.0–0.1)
BASOS ABS: 0 10*3/uL (ref 0.0–0.1)
Basophils Relative: 0 %
Eosinophils Absolute: 0 10*3/uL (ref 0.0–0.7)
Eosinophils Relative: 0 %
HCT: 39.2 % (ref 36.0–46.0)
HEMOGLOBIN: 13.2 g/dL (ref 12.0–15.0)
IMMATURE GRANULOCYTES: 1 %
LYMPHS PCT: 4 %
Lymphs Abs: 0.6 10*3/uL — ABNORMAL LOW (ref 0.7–4.0)
MCH: 31.8 pg (ref 26.0–34.0)
MCHC: 33.7 g/dL (ref 30.0–36.0)
MCV: 94.5 fL (ref 78.0–100.0)
MONO ABS: 0.7 10*3/uL (ref 0.1–1.0)
Monocytes Relative: 4 %
NEUTROS ABS: 14.2 10*3/uL — AB (ref 1.7–7.7)
NEUTROS PCT: 91 %
Platelets: 271 10*3/uL (ref 150–400)
RBC: 4.15 MIL/uL (ref 3.87–5.11)
RDW: 16.1 % — ABNORMAL HIGH (ref 11.5–15.5)
WBC: 15.5 10*3/uL — AB (ref 4.0–10.5)

## 2017-12-01 LAB — BASIC METABOLIC PANEL
Anion gap: 7 (ref 5–15)
BUN: 7 mg/dL — AB (ref 8–23)
CHLORIDE: 103 mmol/L (ref 98–111)
CO2: 28 mmol/L (ref 22–32)
CREATININE: 0.85 mg/dL (ref 0.44–1.00)
Calcium: 8.3 mg/dL — ABNORMAL LOW (ref 8.9–10.3)
GFR calc Af Amer: >60 mL/min (ref >60)
GFR calc non Af Amer: 58 mL/min — ABNORMAL LOW (ref >60)
Glucose, Bld: 135 mg/dL — ABNORMAL HIGH (ref 70–99)
POTASSIUM: 3.6 mmol/L (ref 3.5–5.1)
Sodium: 138 mmol/L (ref 135–145)

## 2017-12-01 LAB — URINALYSIS, ROUTINE W REFLEX MICROSCOPIC
BILIRUBIN URINE: NEGATIVE
GLUCOSE, UA: NEGATIVE mg/dL
HGB URINE DIPSTICK: NEGATIVE
KETONES UR: NEGATIVE mg/dL
Leukocytes, UA: NEGATIVE
Nitrite: NEGATIVE
PH: 9 — AB (ref 5.0–8.0)
Protein, ur: NEGATIVE mg/dL
Specific Gravity, Urine: 1.01 (ref 1.005–1.030)

## 2017-12-01 LAB — TYPE AND SCREEN
ABO/RH(D): O POS
ANTIBODY SCREEN: NEGATIVE

## 2017-12-01 LAB — ABO/RH: ABO/RH(D): O POS

## 2017-12-01 LAB — PROTIME-INR
INR: 1.21
Prothrombin Time: 15.2 seconds (ref 11.4–15.2)

## 2017-12-01 LAB — APTT: APTT: 29 s (ref 24–36)

## 2017-12-01 MED ORDER — VITAMIN D3 25 MCG (1000 UNIT) PO TABS
1000.0000 [IU] | ORAL_TABLET | Freq: Every day | ORAL | Status: DC
  Administered 2017-12-01 – 2017-12-03 (×3): 1000 [IU] via ORAL
  Filled 2017-12-01 (×6): qty 1

## 2017-12-01 MED ORDER — CITALOPRAM HYDROBROMIDE 20 MG PO TABS
20.0000 mg | ORAL_TABLET | Freq: Every day | ORAL | Status: DC
  Administered 2017-12-01 – 2017-12-03 (×3): 20 mg via ORAL
  Filled 2017-12-01 (×3): qty 1

## 2017-12-01 MED ORDER — MORPHINE SULFATE (PF) 4 MG/ML IV SOLN: 0.5000 mg | INTRAVENOUS | Status: DC | PRN

## 2017-12-01 MED ORDER — FLEET ENEMA 7-19 GM/118ML RE ENEM: 1.0000 | ENEMA | Freq: Once | RECTAL | Status: DC | PRN

## 2017-12-01 MED ORDER — SENNOSIDES-DOCUSATE SODIUM 8.6-50 MG PO TABS: 1.0000 | ORAL_TABLET | Freq: Every evening | ORAL | Status: DC | PRN

## 2017-12-01 MED ORDER — ONDANSETRON HCL 4 MG/2ML IJ SOLN
4.0000 mg | Freq: Three times a day (TID) | INTRAMUSCULAR | Status: DC | PRN
  Administered 2017-12-01: 4 mg via INTRAVENOUS
  Filled 2017-12-01: qty 2

## 2017-12-01 MED ORDER — HYDRALAZINE HCL 20 MG/ML IJ SOLN: 5.0000 mg | Freq: Three times a day (TID) | INTRAMUSCULAR | Status: DC | PRN

## 2017-12-01 MED ORDER — TRAMADOL HCL 50 MG PO TABS: 50.0000 mg | ORAL_TABLET | Freq: Four times a day (QID) | ORAL | Status: DC | PRN

## 2017-12-01 MED ORDER — ENOXAPARIN SODIUM 30 MG/0.3ML ~~LOC~~ SOLN
30.0000 mg | SUBCUTANEOUS | Status: DC
  Administered 2017-12-01 – 2017-12-03 (×3): 30 mg via SUBCUTANEOUS
  Filled 2017-12-01 (×3): qty 0.3

## 2017-12-01 MED ORDER — LORAZEPAM 2 MG/ML IJ SOLN
0.5000 mg | Freq: Four times a day (QID) | INTRAMUSCULAR | Status: DC | PRN
  Administered 2017-12-01: 1 mg via INTRAVENOUS
  Filled 2017-12-01: qty 1

## 2017-12-01 MED ORDER — SODIUM CHLORIDE 0.9 % IV SOLN
INTRAVENOUS | Status: DC
  Administered 2017-12-01: 11:00:00 via INTRAVENOUS
  Administered 2017-12-02: 1 mL via INTRAVENOUS

## 2017-12-01 MED ORDER — HYDROCODONE-ACETAMINOPHEN 5-325 MG PO TABS
1.0000 | ORAL_TABLET | Freq: Four times a day (QID) | ORAL | Status: DC | PRN
  Administered 2017-12-01: 1 via ORAL
  Administered 2017-12-01: 2 via ORAL
  Administered 2017-12-02 (×2): 1 via ORAL
  Filled 2017-12-01: qty 1
  Filled 2017-12-01: qty 2
  Filled 2017-12-01 (×2): qty 1

## 2017-12-01 MED ORDER — CALCIUM-VITAMIN D3 600-125 MG-UNIT PO TABS: 600.0000 mg | ORAL_TABLET | Freq: Every day | ORAL | Status: DC

## 2017-12-01 MED ORDER — BISACODYL 5 MG PO TBEC: 5.0000 mg | DELAYED_RELEASE_TABLET | Freq: Every day | ORAL | Status: DC | PRN

## 2017-12-01 MED ORDER — THYROID 60 MG PO TABS
60.0000 mg | ORAL_TABLET | Freq: Every day | ORAL | Status: DC
  Administered 2017-12-01 – 2017-12-03 (×3): 60 mg via ORAL
  Filled 2017-12-01 (×4): qty 1

## 2017-12-01 NOTE — Clinical Social Work Note (Signed)
Clinical Social Work Assessment  Patient Details  Name: Alice Hayes MRN: [id number] Date of Birth: 1923-02-26  Date of referral:  12/01/17               Reason for consult:  Facility Placement, Discharge Planning                Permission sought to share information with:  Family Supports, Customer service manager, Case Optician, dispensing granted to share information::  Yes, Verbal Permission Granted(pt able to give permission to talk to daughter at bedside; daughter gave permission to talk to [place], pt only oriented to self)  Name::      Linna Hoff  Agency::   [facility]  Relationship::   daughter   Contact Information:    [phone]  Housing/Transportation Living arrangements for the past 2 months:  [place] of Information:  Adult Children Patient Interpreter Needed:  None Criminal Activity/Legal Involvement Pertinent to Current Situation/Hospitalization:  No - Comment as needed Significant Relationships:  Adult Children, Community Support Lives with:  Facility Resident Do you feel safe going back to the place where you live?  Yes Need for family participation in patient care:  Yes (Comment)  Care giving concerns:  Pt only oriented to self, pt has supportive daughters, has lived at [facility] which is memory care. Pt is non weight bearing, would benefit from returning to memory care with assistance as it is a familiar environment.    Social Worker assessment / plan:  CSW met with pt and pt daughter at bedside. Pt daughter Tomi Bamberger states that she and her siblings live in the [place] area and that her mother resides in a Memory Care at [facility]  ALF. Pt only oriented to self, has poor awareness that she has broken her pelvis, but able to state that I could talk with daughter.   Pt daughter agrees and is interested in returning home with home health as recommended by therapy staff in order to make sure that  pt returns to a familiar setting and is surrounded by support and safety.   CSW will f/u with [facility] staff, recommendations input into FL2. Home health will be arranged by RN Case Manager, verbally consulted by this writer.   Employment status:  Retired Health visitor PT Recommendations:  Home with [place], [place] / Referral to community resources:  Other (Comment Required)(Home Health)  Patient/Family's Response to care:  Pt daughter understands CSW role and recommendations for pt by therapy staff. Would like for pt to return to [facility].   Patient/Family's Understanding of and Emotional Response to Diagnosis, Current Treatment, and Prognosis: Pt daughter states understanding of pt diagnosis, current treatment and prognosis. Pt daughter has good understanding of pt needs and the challenges that her memory loss present to her safety. Pt not a surgical candidate and pt daughter would prefer for her to return to her prior home with home health therapies and sitting service.   Emotional Assessment Appearance:  Appears stated age Attitude/Demeanor/Rapport:  Unable to Assess Affect (typically observed):  Restless, Frustrated, Withdrawn Orientation:  Oriented to Self, Fluctuating Orientation (Suspected and/or reported [place]) Alcohol / Substance use:  Not Applicable Psych involvement (Current and /or in the community):  No (Comment)  Discharge Needs  Concerns to be addressed:  Care Coordination, Discharge Planning Concerns Readmission within the last 30 days:  No Current discharge risk:  Cognitively Impaired, Dependent with Mobility, Physical Impairment Barriers to Discharge:  Continued Medical Work up   [employer], [place] 12/01/2017, 4:49 PM

## 2017-12-01 NOTE — Progress Notes (Signed)
Appreciate Dr. Thomasenia Bottoms, Ortho Left Sup ramus/acetabular fracture  No operative intervention is indicated on this patient  To follow up with Dr. Marlou Sa as OP in 2-3 weeks  Continue PT/ OT for now , touch down weight bearing

## 2017-12-01 NOTE — ED Provider Notes (Addendum)
[facility] EMERGENCY DEPARTMENT Provider Note   CSN: [id number] Arrival date & time: 12/01/17  0413     History   Chief Complaint Chief Complaint  Patient presents with  . Fall    HPI Alice Hayes is a 84 y.o. female.  HPI  This is a 84 year old female with a history of dementia, coronary artery disease, hypertension who presents following a fall.  Patient is currently in the memory care unit.  She is only oriented to herself at baseline.  Patient reports that she was walking" turned wrong" and fell.  She denies hitting her head or loss of consciousness.  She denies syncope.  She is on complaining of left hip pain.  Patient is unable to quantify pain.  She has not been ambulatory since fall.  She denies neck pain, headache, chest pain, shortness breath, abdominal pain.  Daughter is at the bedside.  Confirms that her mother is otherwise at baseline.  Level 5 caveat for dementia  Past Medical History:  Diagnosis Date  . CAD (coronary artery disease)    nonobstructive cath 2006  . Cholelithiases   . COPD (chronic obstructive pulmonary disease) ([place])   . Diastolic heart failure ([place])   . HTN (hypertension)   . Hyperlipidemia   . Melanoma ([place])   . Osteoarthritis   . Osteoporosis     Patient Active Problem List   Diagnosis Date Noted  . Dyspnea 11/23/2013    Past Surgical History:  Procedure Laterality Date  . APPENDECTOMY    . BLADDER SUSPENSION     2006  . BREAST LUMPECTOMY    . CARPAL TUNNEL RELEASE     2007  . CATARACT EXTRACTION    . LYMPH NODE BIOPSY    . MELANOMA EXCISION     2008  . PAROTIDECTOMY     1959  . TONSILLECTOMY AND ADENOIDECTOMY       OB History   None      Home Medications    Prior to Admission medications   Medication Sig Start Date End Date Taking? Authorizing Provider  aspirin-sod bicarb-citric acid (ALKA-SELTZER ORIGINAL) 325 MG TBEF tablet Take 650 mg by mouth daily as needed (osteoarthritis aches and  pain).    [provider]  Calcium Carbonate-Vitamin D (CALCIUM-VITAMIN D3 PO) Take 1 tablet by mouth daily. Calcium 600 mg + D3 200IU    [provider]  cholecalciferol (VITAMIN D) 1000 units tablet Take 1,000 Units by mouth daily.    [provider]  citalopram (CELEXA) 20 MG tablet Take 20 mg by mouth daily.    [provider]  CO ENZYME Q-10 PO Take 1 capsule by mouth daily. Co Q-10 10 mg capsule    [provider]  HYDROcodone-acetaminophen (NORCO) 5-325 MG tablet Take 1 tablet by mouth every 6 (six) hours as needed. 11/02/17   Doristine Devoid, PA-C  Multiple Vitamins-Minerals (ICAPS) CAPS Take 1 capsule by mouth daily.     [provider]  thyroid (ARMOUR THYROID) 60 MG tablet Take 60 mg by mouth daily before breakfast.     [provider]  traMADol (ULTRAM) 50 MG tablet Take 1 tablet (50 mg total) by mouth every 6 (six) hours as needed. 11/02/17   Doristine Devoid, PA-C    Family History Family History  Problem Relation Age of Onset  . CAD Father        Sudden death age 26  . CAD Son 34  MI.  Alive in his 43s  . Cancer Daughter        Breast  . Pancreatitis Daughter        Severe episode    Social History Social History   Tobacco Use  . Smoking status: Never Smoker  . Smokeless tobacco: Never Used  Substance Use Topics  . Alcohol use: Not on file  . Drug use: Not on file     Allergies   Voltaren ophthalmic [diclofenac]   Review of Systems Review of Systems  Constitutional: Negative for fever.  Respiratory: Negative for shortness of breath.   Cardiovascular: Negative for chest pain.  Gastrointestinal: Negative for abdominal pain.  Genitourinary: Negative for dysuria.  Musculoskeletal:       Left hip pain  Neurological: Negative for syncope.  All other systems reviewed and are negative.    Physical Exam Updated Vital Signs BP (!) 149/67   Pulse 77   Temp 98.1 F (36.7 C) (Oral)    Resp (!) 22   Ht 4\' 11"  (1.499 m)   Wt 54.9 kg (121 lb)   SpO2 92%   BMI 24.44 kg/m   Physical Exam  Constitutional: She is oriented to person, place, and time. She appears well-developed and well-nourished.  Elderly, nontoxic-appearing  HENT:  Head: Normocephalic and atraumatic.  Eyes: Pupils are equal, round, and reactive to light.  Neck: Normal range of motion. Neck supple.  No midline C-spine tenderness to palpation, step-off, or deformity  Cardiovascular: Normal rate, regular rhythm and normal heart sounds.  Pulmonary/Chest: Effort normal and breath sounds normal. No respiratory distress. She has no wheezes.  Abdominal: Soft. Bowel sounds are normal. There is no tenderness. There is no rebound and no guarding.  Musculoskeletal: Normal range of motion.  Normal range of motion bilateral hips and knees, patient reports pain with range of motion of the left hip, no foreshortening noted, 2+ DP pulses bilaterally  Neurological: She is alert and oriented to person, place, and time.  Skin: Skin is warm and dry.  No skin tearing noted  Psychiatric: She has a normal mood and affect.  Nursing note and vitals reviewed.    ED Treatments / Results  Labs (all labs ordered are listed, but only abnormal results are displayed) Labs Reviewed  CBC WITH DIFFERENTIAL/PLATELET - Abnormal; Notable for the following components:      Result Value   WBC 15.5 (*)    RDW 16.1 (*)    Neutro Abs 14.2 (*)    Lymphs Abs 0.6 (*)    All other components within normal limits  BASIC METABOLIC PANEL - Abnormal; Notable for the following components:   Glucose, Bld 135 (*)    BUN 7 (*)    Calcium 8.3 (*)    GFR calc non Af Amer 58 (*)    All other components within normal limits  URINALYSIS, ROUTINE W REFLEX MICROSCOPIC    EKG EKG Interpretation  Date/Time:  Wednesday December 01 2017 06:16:54 EDT Ventricular Rate:  80 PR Interval:    QRS Duration: 120 QT Interval:  417 QTC Calculation: 475 R  Axis:   -59 Text Interpretation:  Sinus rhythm Left atrial enlargement Incomplete left bundle branch block Baseline wander Confirmed by Thayer Jew [id number]) on 12/01/2017 6:56:06 AM   Radiology Ct Pelvis Wo Contrast  Result Date: 12/01/2017 CLINICAL DATA:  84 y/o  F; left hip pain with painful left groin. EXAM: CT PELVIS WITHOUT CONTRAST TECHNIQUE: Multidetector CT imaging of the pelvis was performed following  the standard protocol without intravenous contrast. COMPARISON:  08/31/2017 pelvis and right hip radiographs. FINDINGS: Urinary Tract:  No abnormality visualized. Bowel: Advanced sigmoid diverticulosis, no findings of acute diverticulitis. Vascular/Lymphatic: Calcific atherosclerosis of the aorta and bilateral iliofemoral arteries. Reproductive:  No mass or other significant abnormality Other:  None. Musculoskeletal: Acute minimally displaced fracture of left superior pubic ramus. Acute mildly displaced fracture of the left superior pubic ramus extending into the anterior column of acetabulum. No additional fracture or dislocation identified. IMPRESSION: 1. Acute minimally displaced fracture of left inferior pubic ramus. 2. Acute mildly displaced fracture of left superior pubic ramus extending into anterior column of acetabulum. 3. No additional fracture or dislocation identified. Electronically Signed   By: Kristine Garbe M.D.   On: 12/01/2017 06:10   Dg Hip Unilat With Pelvis 2-3 Views Left  Result Date: 12/01/2017 CLINICAL DATA:  Left hip pain after a fall today. EXAM: DG HIP (WITH OR WITHOUT PELVIS) 2-3V LEFT COMPARISON:  None. FINDINGS: Displaced fracture of the base of the left superior pubic ramus with possible extension to the acetabulum. Nondisplaced fractures of the inferior pubic ramus. No fracture or dislocation identified in the left hip. Degenerative changes are present in both hips. SI joints and symphysis pubis are not displaced. Vascular calcifications. IMPRESSION: Acute  fractures of the left superior and inferior pubic ramus with possible extension to the acetabulum. Degenerative changes in the hips. Electronically Signed   By: Lucienne Capers M.D.   On: 12/01/2017 05:08    Procedures Procedures (including critical care time)  Medications Ordered in ED Medications - No data to display   Initial Impression / Assessment and Plan / ED Course  I have reviewed the triage vital signs and the nursing notes.  Pertinent labs & imaging results that were available during my care of the patient were reviewed by me and considered in my medical decision making (see chart for details).  Clinical Course as of Dec 02 654  Wed Dec 01, 2017  0643 She was discussed with Dr. Lorin Mercy, orthopedics.  Recommends initially nonweightbearing but patient will likely be able to have touchdown weightbearing shortly.  No surgical intervention.  Formal consult to follow by Dr. Marlou Sa.  We will plan to admit to the hospitalist for pain management and PT/OT when cleared.   [CH]    Clinical Course User Index [CH] Consuela Widener, Barbette Hair, MD    Shunt presents after reported mechanical fall.  She is at her baseline.  History of dementia.  Nontoxic-appearing and vital signs reassuring.  She has range of motion without deformity of the left hip but does have pain in the left hip and groin.  Initial pelvic x-ray is concerning for inferior and superior pelvic rami fractures which may extend into the acetabulum.  CT scan obtained to further evaluate.  Basic lab work also obtained in anticipation for possible admission.  Lab work only notable for leukocytosis.  This could be an acute phase reaction.  Patient does not endorse any infectious symptoms.  Given her age, will check a urine.  CT as above.  Discussion with orthopedics recorded above.  We will plan to admit to the hospitalist.  Patient and her daughter were updated at the bedside.  Final Clinical Impressions(s) / ED Diagnoses   Final diagnoses:    Closed fracture of left inferior pubic ramus, initial encounter ([place])  Closed fracture of left superior pubic ramus, initial encounter [facility])    ED Discharge Orders    None  Merryl Hacker, MD 12/01/17 [id number]    Merryl Hacker, MD 12/01/17 [phone]

## 2017-12-01 NOTE — ED Notes (Signed)
Admitting MD at bedside.

## 2017-12-01 NOTE — NC FL2 (Signed)
[place] LEVEL OF CARE SCREENING TOOL     IDENTIFICATION  Patient Name: Alice Hayes Birthdate: [birth year] Sex: female Admission Date (Current Location): 12/01/2017  [facility] and [place] Number:  Herbalist and Address:  [facility] [place], [place] [address], [place], [place] [address]      Provider Number: [id number]  Attending Physician Name and Address:  Karmen Bongo, MD  Relative Name and Phone Number:       Current Level of Care: Hospital Recommended Level of Care: Memory Care, [place] Prior Approval Number:    Date Approved/Denied:   PASRR Number:    Discharge Plan: Other (Comment)([facility])    Current Diagnoses: Patient Active Problem List   Diagnosis Date Noted  . Hypothyroidism 12/01/2017  . Dementia 12/01/2017  . Hyperlipidemia 12/01/2017  . Fracture of pubic ramus ([place]) 12/01/2017  . Hypertension 12/01/2017    Orientation RESPIRATION BLADDER Height & Weight     Self  Normal Incontinent Weight: 116 lb 6.5 oz (52.8 kg) Height:  4\' 11"  (149.9 cm)  BEHAVIORAL SYMPTOMS/MOOD NEUROLOGICAL BOWEL NUTRITION STATUS     Diagnosis of Dementia Continent Diet(regular diet)  AMBULATORY STATUS COMMUNICATION OF NEEDS Skin   Extensive Assist Verbally Bruising                       Personal Care Assistance Level of Assistance  Bathing, Feeding, Dressing Bathing Assistance: Maximum assistance Feeding assistance: Limited assistance Dressing Assistance: Maximum assistance     Functional Limitations Info  Sight, Hearing, Speech Sight Info: Adequate Hearing Info: Impaired Speech Info: Adequate    SPECIAL CARE FACTORS FREQUENCY  PT (By licensed PT), OT (By licensed OT)     PT Frequency: 3x week OT Frequency: 2x week            Contractures Contractures Info: Not present    Additional Factors Info  Code Status, Allergies, Psychotropic Code Status Info:  DNR Allergies Info: VOLTAREN OPHTHALMIC DICLOFENAC  Psychotropic Info: citalopram (CELEXA) tablet 20 mg daily PO         Current Medications (12/01/2017):  This is the current hospital active medication list Current Facility-Administered Medications  Medication Dose Route Frequency Provider Last Rate Last Dose  . 0.9 %  sodium chloride infusion   Intravenous Continuous Rondel Jumbo, PA-C 75 mL/hr at 12/01/17 1033    . bisacodyl (DULCOLAX) EC tablet 5 mg  5 mg Oral Daily PRN Rondel Jumbo, PA-C      . cholecalciferol (VITAMIN D) tablet 1,000 Units  1,000 Units Oral Daily Rondel Jumbo, PA-C   1,000 Units at 12/01/17 1033  . citalopram (CELEXA) tablet 20 mg  20 mg Oral Daily Rondel Jumbo, PA-C   20 mg at 12/01/17 1033  . enoxaparin (LOVENOX) injection 30 mg  30 mg Subcutaneous Q24H Sharene Butters E, PA-C   30 mg at 12/01/17 1134  . hydrALAZINE (APRESOLINE) injection 5 mg  5 mg Intravenous Q8H PRN Rondel Jumbo, PA-C      . HYDROcodone-acetaminophen (NORCO/VICODIN) 5-325 MG per tablet 1-2 tablet  1-2 tablet Oral Q6H PRN Rondel Jumbo, PA-C   1 tablet at 12/01/17 1057  . LORazepam (ATIVAN) injection 0.5-1 mg  0.5-1 mg Intravenous Q6H PRN Rondel Jumbo, PA-C   1 mg at 12/01/17 1242  . morphine 4 MG/ML injection 0.52 mg  0.52 mg Intravenous Q4H PRN Rondel Jumbo, PA-C      .  ondansetron (ZOFRAN) injection 4 mg  4 mg Intravenous Q8H PRN Rondel Jumbo, PA-C   4 mg at 12/01/17 1242  . senna-docusate (Senokot-S) tablet 1 tablet  1 tablet Oral QHS PRN Rondel Jumbo, PA-C      . sodium phosphate (FLEET) 7-19 GM/118ML enema 1 enema  1 enema Rectal Once PRN Rondel Jumbo, PA-C      . thyroid (ARMOUR) tablet 60 mg  60 mg Oral QAC breakfast Rondel Jumbo, PA-C   60 mg at 12/01/17 1055  . traMADol (ULTRAM) tablet 50 mg  50 mg Oral Q6H PRN Rondel Jumbo, PA-C         Discharge Medications: Please see discharge summary for a list of discharge medications.  Relevant Imaging  Results:  Relevant Lab Results:   Additional Information SS# [place], [place]

## 2017-12-01 NOTE — Consult Note (Signed)
Reason for Consult:Left pelvic fxs Referring Physician: Kemper Hochman Figler is an 84 y.o. female.  HPI: Alice Hayes fell at the memory unit of [facility] where she resides. She had immediate pain and was brought to the ED for evaluation. A CT of the pelvis showed a left sup ramus fx that extended into the anterior acetabulum and orthopedic surgery was consulted. She has dementia but is able to answer most questions and participate with her exam.  Past Medical History:  Diagnosis Date  . CAD (coronary artery disease)    nonobstructive cath 2006  . Cholelithiases   . COPD (chronic obstructive pulmonary disease) ([place])   . Diastolic heart failure ([place])   . HTN (hypertension)   . Hyperlipidemia   . Melanoma ([place])   . Osteoarthritis   . Osteoporosis     Past Surgical History:  Procedure Laterality Date  . APPENDECTOMY    . BLADDER SUSPENSION     2006  . BREAST LUMPECTOMY    . CARPAL TUNNEL RELEASE     2007  . CATARACT EXTRACTION    . LYMPH NODE BIOPSY    . MELANOMA EXCISION     2008  . PAROTIDECTOMY     1959  . TONSILLECTOMY AND ADENOIDECTOMY      Family History  Problem Relation Age of Onset  . CAD Father        Sudden death age 90  . CAD Son 21       MI.  Alive in his 52s  . Cancer Daughter        Breast  . Pancreatitis Daughter        Severe episode    Social History:  reports that she has never smoked. She has never used smokeless tobacco. She reports that she does not drink alcohol or use drugs.  Allergies:  Allergies  Allergen Reactions  . Voltaren Ophthalmic [Diclofenac] Other (See Comments)    Reaction not recalled by family ??    Medications: I have reviewed the patient's current medications.  Results for orders placed or performed during the hospital encounter of 12/01/17 (from the past 48 hour(s))  CBC with Differential     Status: Abnormal   Collection Time: 12/01/17  5:39 AM  Result Value Ref Range   WBC 15.5 (H) 4.0 - 10.5 K/uL   RBC 4.15  3.87 - 5.11 MIL/uL   Hemoglobin 13.2 12.0 - 15.0 g/dL   HCT 39.2 36.0 - 46.0 %   MCV 94.5 78.0 - 100.0 fL   MCH 31.8 26.0 - 34.0 pg   MCHC 33.7 30.0 - 36.0 g/dL   RDW 16.1 (H) 11.5 - 15.5 %   Platelets 271 150 - 400 K/uL   Neutrophils Relative % 91 %   Neutro Abs 14.2 (H) 1.7 - 7.7 K/uL   Lymphocytes Relative 4 %   Lymphs Abs 0.6 (L) 0.7 - 4.0 K/uL   Monocytes Relative 4 %   Monocytes Absolute 0.7 0.1 - 1.0 K/uL   Eosinophils Relative 0 %   Eosinophils Absolute 0.0 0.0 - 0.7 K/uL   Basophils Relative 0 %   Basophils Absolute 0.0 0.0 - 0.1 K/uL   Immature Granulocytes 1 %   Abs Immature Granulocytes 0.1 0.0 - 0.1 K/uL    Comment: Performed at [facility], [address], [place], [place] [address]  Basic metabolic panel     Status: Abnormal   Collection Time: 12/01/17  5:39 AM  Result Value Ref Range  Sodium 138 135 - 145 mmol/L   Potassium 3.6 3.5 - 5.1 mmol/L   Chloride 103 98 - 111 mmol/L    Comment: Please note change in reference range.   CO2 28 22 - 32 mmol/L   Glucose, Bld 135 (H) 70 - 99 mg/dL    Comment: Please note change in reference range.   BUN 7 (L) 8 - 23 mg/dL    Comment: Please note change in reference range.   Creatinine, Ser 0.85 0.44 - 1.00 mg/dL   Calcium 8.3 (L) 8.9 - 10.3 mg/dL   GFR calc non Af Amer 58 (L) >60 mL/min   GFR calc Af Amer >60 >60 mL/min    Comment: (NOTE) The eGFR has been calculated using the CKD EPI equation. This calculation has not been validated in all clinical situations. eGFR's persistently <60 mL/min signify possible Chronic Kidney Disease.    Anion gap 7 5 - 15    Comment: Performed at [facility] [address], [place], [place] [address]  Protime-INR     Status: None   Collection Time: 12/01/17  8:03 AM  Result Value Ref Range   Prothrombin Time 15.2 11.4 - 15.2 seconds   INR 1.21     Comment: Performed at [facility] [address], [place], [place] [address]  APTT     Status: None    Collection Time: 12/01/17  8:03 AM  Result Value Ref Range   aPTT 29 24 - 36 seconds    Comment: Performed at [facility] [address], [place], [place] [address]  Type and screen [place]     Status: None   Collection Time: 12/01/17  8:03 AM  Result Value Ref Range   ABO/RH(D) O POS    Antibody Screen NEG    Sample Expiration      12/04/2017 Performed at [facility], [place] [address], [place], [place] [address]   ABO/Rh     Status: None   Collection Time: 12/01/17  8:07 AM  Result Value Ref Range   ABO/RH(D)      O POS Performed at [facility] [address], [place], [place] [address]     Ct Pelvis Wo Contrast  Result Date: 12/01/2017 CLINICAL DATA:  84 y/o  F; left hip pain with painful left groin. EXAM: CT PELVIS WITHOUT CONTRAST TECHNIQUE: Multidetector CT imaging of the pelvis was performed following the standard protocol without intravenous contrast. COMPARISON:  08/31/2017 pelvis and right hip radiographs. FINDINGS: Urinary Tract:  No abnormality visualized. Bowel: Advanced sigmoid diverticulosis, no findings of acute diverticulitis. Vascular/Lymphatic: Calcific atherosclerosis of the aorta and bilateral iliofemoral arteries. Reproductive:  No mass or other significant abnormality Other:  None. Musculoskeletal: Acute minimally displaced fracture of left superior pubic ramus. Acute mildly displaced fracture of the left superior pubic ramus extending into the anterior column of acetabulum. No additional fracture or dislocation identified. IMPRESSION: 1. Acute minimally displaced fracture of left inferior pubic ramus. 2. Acute mildly displaced fracture of left superior pubic ramus extending into anterior column of acetabulum. 3. No additional fracture or dislocation identified. Electronically Signed   By: Kristine Garbe M.D.   On: 12/01/2017 06:10   Dg Hip Unilat With Pelvis 2-3 Views Left  Result Date: 12/01/2017 CLINICAL DATA:   Left hip pain after a fall today. EXAM: DG HIP (WITH OR WITHOUT PELVIS) 2-3V LEFT COMPARISON:  None. FINDINGS: Displaced fracture of the base of  the left superior pubic ramus with possible extension to the acetabulum. Nondisplaced fractures of the inferior pubic ramus. No fracture or dislocation identified in the left hip. Degenerative changes are present in both hips. SI joints and symphysis pubis are not displaced. Vascular calcifications. IMPRESSION: Acute fractures of the left superior and inferior pubic ramus with possible extension to the acetabulum. Degenerative changes in the hips. Electronically Signed   By: Lucienne Capers M.D.   On: 12/01/2017 05:08    Review of Systems  Constitutional: Negative for weight loss.  HENT: Negative for ear discharge, ear pain, hearing loss and tinnitus.   Eyes: Negative for blurred vision, double vision, photophobia and pain.  Respiratory: Negative for cough, sputum production and shortness of breath.   Cardiovascular: Negative for chest pain.  Gastrointestinal: Negative for abdominal pain, nausea and vomiting.  Genitourinary: Negative for dysuria, flank pain, frequency and urgency.  Musculoskeletal: Positive for joint pain (Left hip/groin). Negative for back pain, falls, myalgias and neck pain.  Neurological: Negative for dizziness, tingling, sensory change, focal weakness, loss of consciousness and headaches.  Endo/Heme/Allergies: Does not bruise/bleed easily.  Psychiatric/Behavioral: Negative for depression, memory loss and substance abuse. The patient is not nervous/anxious.    Blood pressure (!) 153/71, pulse 72, temperature 97.7 F (36.5 C), temperature source Oral, resp. rate 16, height _0  (1.499 m), weight 52.8 kg (116 lb 6.5 oz), SpO2 93 %. Physical Exam  Constitutional: She appears well-developed and well-nourished. No distress.  HENT:  Head: Normocephalic and atraumatic.  Eyes: Conjunctivae are normal. Right eye exhibits no discharge.  Left eye exhibits no discharge. No scleral icterus.  Neck: Normal range of motion.  Cardiovascular: Normal rate and regular rhythm.  Respiratory: Effort normal. No respiratory distress.  Musculoskeletal:  LLE No traumatic wounds, ecchymosis, or rash  TTP groin  No knee or ankle effusion  Knee stable to varus/ valgus and anterior/posterior stress  Sens DPN, SPN, TN intact  Motor EHL, ext, flex, evers 5/5  DP 1+, PT 1+, No significant edema  Neurological: She is alert.  Skin: Skin is warm and dry. She is not diaphoretic.  Psychiatric: She has a normal mood and affect. Her behavior is normal.    Assessment/Plan: Fall Left sup ramus/acetabular fx -- TDWB on LLE with RW. No operative indication. Should f/u with Dr. Marlou Sa in 2-3 weeks. Ok for discharge back to [facility] from orthopedic standpoint once PT clears. Dementia HLD HTN CHF COPD CAD    Lisette Abu, PA-C Orthopedic Surgery [phone] 12/01/2017, 10:33 AM

## 2017-12-01 NOTE — Progress Notes (Signed)
Orthopedic Tech Progress Note Patient Details:  Alice Hayes 09-13-1922 [id number]  Patient ID: Alice Hayes, female   DOB: March 25, 1923, 84 y.o.   MRN: [id number]   Alice Hayes 12/01/2017, 11:29 AM Pt unable to use trapeze bar patient helper; RN notified

## 2017-12-01 NOTE — Social Work (Signed)
CSW acknowledging consult for possible placement, will follow for disposition.   [place], [place] Work [phone]

## 2017-12-01 NOTE — Evaluation (Signed)
Physical Therapy Evaluation Patient Details Name: Alice Hayes MRN: [id number] DOB: 15-Apr-1923 Today's Date: 12/01/2017   History of Present Illness  Alice Hayes is a 84yo female who comes to [facility] on 7/3 after fall in memory care. Pt found to have sustained anterior Left pubic ramus fracture  and posterior left pubic ramus fracture. PTA pt hqas been in memory care x2 months, prior to that ILF for 6 months. The pt typcally is AMB with RW in facility, and requires supervision only for bed mobility and transfers. History of 2 additional falls, one with rib fractures in June 2019.   Clinical Impression  Pt admitted with above diagnosis. Pt currently with functional limitations due to the deficits listed below (see "PT Problem List"). Upon entry, pt in bed, daughter present. The pt is awake and agreeable to participate. Pain is well managed when patient is not moving. Pt is mildly anxious, perseverating on return to home. The pt is pleasant, conversational, and following simple commands consistently, but is HOH. Mod-max Assist needed for bed mobility, transfers deferred to later session d/t onset of giddiness and emesis at EOB. Functional mobility assessment demonstrates increased effort/time requirements, poor tolerance, and need for physical assistance, whereas the patient performed these at a higher level of independence PTA. Pt will benefit from skilled PT intervention to increase independence and safety with basic mobility in preparation for discharge to the venue listed below.       Follow Up Recommendations Home health PT;Supervision for mobility/OOB(Unclear if pt's ALF can provide level of needed assistance, but it would behoove pt to stay in a familiar environment,. )    Equipment Recommendations  None recommended by PT    Recommendations for Other Services       Precautions / Restrictions Precautions Precautions: Fall Restrictions Weight Bearing Restrictions: Yes LLE Weight  Bearing: Touchdown weight bearing      Mobility  Bed Mobility Overal bed mobility: Needs Assistance Bed Mobility: Supine to Sit;Sit to Supine     Supine to sit: Mod assist Sit to supine: Max assist   General bed mobility comments: participatory in spite of pain, requires some encouragement   Transfers                 General transfer comment: deferred at this time; at EOB< pt sitting tall, beging to get nauseated and starts to dry heave. Pt returned to supine.   Ambulation/Gait                Stairs            Wheelchair Mobility    Modified Rankin (Stroke Patients Only)       Balance Overall balance assessment: Needs assistance Sitting-balance support: Single extremity supported;Feet supported   Sitting balance - Comments: very fearfull adn axnious when moved to EOB, but eventually becomes calm adn is willing to bring trunk forward.                                      Pertinent Vitals/Pain Pain Assessment: Faces Faces Pain Scale: Hurts even more Pain Location: Left groin with movement  Pain Descriptors / Indicators: Aching Pain Intervention(s): Limited activity within patient's tolerance;Monitored during session;Repositioned    Home Living Family/patient expects to be discharged to:: Assisted living               Home Equipment: Gilford Rile - 2 wheels Additional Comments: memory  care x2M     Prior Function Level of Independence: Needs assistance   Gait / Transfers Assistance Needed: facility distances with RW   ADL's / Homemaking Assistance Needed: some assistance needed        Hand Dominance        Extremity/Trunk Assessment        Lower Extremity Assessment Lower Extremity Assessment: LLE deficits/detail;Overall WFL for tasks assessed LLE Deficits / Details: can move limb in bed with pain that is sometimes limiting.  LLE: Unable to fully assess due to pain       Communication   Communication: HOH;Other  (comment)(visual deficits d/t MD. )  Cognition Arousal/Alertness: Awake/alert Behavior During Therapy: WFL for tasks assessed/performed Overall Cognitive Status: History of cognitive impairments - at baseline                                 General Comments: confused at times with ST memory difficulty, repeatedly saying that she needs to leave and return to home.       General Comments      Exercises     Assessment/Plan    PT Assessment Patient needs continued PT services  PT Problem List Decreased activity tolerance;Decreased balance;Decreased mobility;Decreased knowledge of use of DME;Pain       PT Treatment Interventions Gait training;Stair training;Functional mobility training;Therapeutic activities;Therapeutic exercise;Balance training;Patient/family education    PT Goals (Current goals can be found in the Care Plan section)  Acute Rehab PT Goals Patient Stated Goal: wants to return to home  PT Goal Formulation: With patient Time For Goal Achievement: 12/15/17 Potential to Achieve Goals: Fair    Frequency Min 2X/week   Barriers to discharge        Co-evaluation               AM-PAC PT "6 Clicks" Daily Activity  Outcome Measure Difficulty turning over in bed (including adjusting bedclothes, sheets and blankets)?: A Lot Difficulty moving from lying on back to sitting on the side of the bed? : A Lot Difficulty sitting down on and standing up from a chair with arms (e.g., wheelchair, bedside commode, etc,.)?: Unable Help needed moving to and from a bed to chair (including a wheelchair)?: Total Help needed walking in hospital room?: Total Help needed climbing 3-5 steps with a railing? : Total 6 Click Score: 8    End of Session   Activity Tolerance: Patient tolerated treatment well;Treatment limited secondary to medical complications (Comment)(nausea, dizziness, and dry heaving at EOB ) Patient left: in bed;with bed alarm set;with call bell/phone  within reach;with family/visitor present Nurse Communication: Mobility status PT Visit Diagnosis: Unsteadiness on feet (R26.81);Other abnormalities of gait and mobility (R26.89);Difficulty in walking, not elsewhere classified (R26.2);Muscle weakness (generalized) (M62.81)    Time: 1010-1031 PT Time Calculation (min) (ACUTE ONLY): 21 min   Charges:   PT Evaluation $PT Eval Moderate Complexity: 1 Mod PT Treatments $Therapeutic Activity: 8-22 mins   PT G Codes:        10:59 AM, 16-Dec-2017 Etta Grandchild, PT, DPT Physical Therapist - [place] [phone] (Pager)  [phone] (Office)     Buccola,Allan C 2017/12/16, 10:57 AM

## 2017-12-01 NOTE — H&P (Addendum)
History and Physical    Alice Hayes QZE:[id number] DOB: [birth year] DOA: 12/01/2017   PCP: Alice Shanks, MD   Patient coming from:  Home    Chief Complaint: Fall   HPI: Alice Hayes is a 84 y.o. female with medical history significant for dementia resident of memory care facility, CAD status post cardiac catheterization 2016 for nonobstructive disease, hypothyroidism, hyperlipidemia, osteoporosis, brought to the EDt after sustaining a mechanical fall.  Daughter is the history provider due to patient's level V Caveat due to dementia, She reports she was walking, "turning wrong, and fell".  She denies patient hitting her head or losing consciousness.  She denies any syncope or presyncope or patient reporting chest pain, palpitations or shortness of breath/cough. Since her fall last night, she has been non ambulatory. She denies any neck pain, headache or vision changes.  Daughter confirms that her mother is otherwise at baseline.  ED Course:  BP (!) 149/78   Pulse 77   Temp 98.1 F (36.7 C) (Oral)   Resp (!) 29   Ht 4\' 11"  (1.499 m)   Wt 54.9 kg (121 lb)   SpO2 93%   BMI 24.44 kg/m   WBC 15.5, glucose 135 CT of the pelvis without contrast shows acute minimally displaced fracture of the left anterior pubic ramus, and acute mildly displaced fracture of the left superior pubic ramus, extending into the anterior column of the acetabulum, without any other fractures or dislocations. Orthopedic consultation was called, they recommended initially no weightbearing, and that the patient will likely be able to touchdown weightbearing shortly.  No surgical intervention is recommended for now, although this will be confirmed after formal Ortho consult    Review of Systems: Level V Caveat due to dementia  Past Medical History:  Diagnosis Date  . CAD (coronary artery disease)    nonobstructive cath 2006  . Cholelithiases   . COPD (chronic obstructive pulmonary disease) ([place])   .  Diastolic heart failure ([place])   . HTN (hypertension)   . Hyperlipidemia   . Melanoma ([place])   . Osteoarthritis   . Osteoporosis     Past Surgical History:  Procedure Laterality Date  . APPENDECTOMY    . BLADDER SUSPENSION     2006  . BREAST LUMPECTOMY    . CARPAL TUNNEL RELEASE     2007  . CATARACT EXTRACTION    . LYMPH NODE BIOPSY    . MELANOMA EXCISION     2008  . PAROTIDECTOMY     1959  . TONSILLECTOMY AND ADENOIDECTOMY      Social History Social History   Socioeconomic History  . Marital status: Widowed    Spouse name: Not on file  . Number of children: Not on file  . Years of education: Not on file  . Highest education level: Not on file  Occupational History  . Not on file  Social Needs  . Financial resource strain: Not on file  . Food insecurity:    Worry: Not on file    Inability: Not on file  . Transportation needs:    Medical: Not on file    Non-medical: Not on file  Tobacco Use  . Smoking status: Never Smoker  . Smokeless tobacco: Never Used  Substance and Sexual Activity  . Alcohol use: Never    Frequency: Never  . Drug use: Never  . Sexual activity: Not Currently  Lifestyle  . Physical activity:    Days per week: Not on file  Minutes per session: Not on file  . Stress: Not on file  Relationships  . Social connections:    Talks on phone: Not on file    Gets together: Not on file    Attends religious service: Not on file    Active member of club or organization: Not on file    Attends meetings of clubs or organizations: Not on file    Relationship status: Not on file  . Intimate partner violence:    Fear of current or ex partner: Not on file    Emotionally abused: Not on file    Physically abused: Not on file    Forced sexual activity: Not on file  Other Topics Concern  . Not on file  Social History Narrative   Lives in memory care center     Allergies  Allergen Reactions  . Voltaren Ophthalmic [Diclofenac] Other (See  Comments)    Reaction not recalled by family ??    Family History  Problem Relation Age of Onset  . CAD Father        Sudden death age 4  . CAD Son 70       MI.  Alive in his 63s  . Cancer Daughter        Breast  . Pancreatitis Daughter        Severe episode       Prior to Admission medications   Medication Sig Start Date End Date Taking? Authorizing Provider  aspirin-sod bicarb-citric acid (ALKA-SELTZER ORIGINAL) 325 MG TBEF tablet Take 650 mg by mouth daily as needed (osteoarthritis aches and pain).    [provider]  Calcium Carbonate-Vitamin D (CALCIUM-VITAMIN D3 PO) Take 1 tablet by mouth daily. Calcium 600 mg + D3 200IU    [provider]  cholecalciferol (VITAMIN D) 1000 units tablet Take 1,000 Units by mouth daily.    [provider]  citalopram (CELEXA) 20 MG tablet Take 20 mg by mouth daily.    [provider]  CO ENZYME Q-10 PO Take 1 capsule by mouth daily. Co Q-10 10 mg capsule    [provider]  HYDROcodone-acetaminophen (NORCO) 5-325 MG tablet Take 1 tablet by mouth every 6 (six) hours as needed. 11/02/17   Doristine Devoid, PA-C  Multiple Vitamins-Minerals (ICAPS) CAPS Take 1 capsule by mouth daily.     [provider]  thyroid (ARMOUR THYROID) 60 MG tablet Take 60 mg by mouth daily before breakfast.     [provider]  traMADol (ULTRAM) 50 MG tablet Take 1 tablet (50 mg total) by mouth every 6 (six) hours as needed. 11/02/17   Doristine Devoid, PA-C     Physical Exam:  Vitals:   12/01/17 0515 12/01/17 0630 12/01/17 0645 12/01/17 0700  BP: 139/63 (!) 149/67 (!) 142/74 (!) 149/78  Pulse: 68 77 76 77  Resp:  (!) 22 (!) 25 (!) 29  Temp:      TempSrc:      SpO2: 90% 92% 92% 93%  Weight:      Height:       Constitutional: NAD, calm, comfortable  Eyes: PERRL, lids and conjunctivae normal. Arcus senilis ENMT: Mucous membranes are moist, without exudate or lesions .  Neck: normal, supple, no  masses, no thyromegaly Respiratory: clear to auscultation bilaterally, no wheezing, no crackles. Normal respiratory effort  Cardiovascular: Regular rate and rhythm,  1/6 murmur, rubs or gallops. No extremity edema. 2+ pedal pulses. No carotid bruits.  Abdomen: Soft, non tender,  No hepatosplenomegaly. Bowel sounds positive.  Musculoskeletal: Pain with range of motion of the left hip, no shortening noted.  No skin tears are noted.   Skin: no jaundice, No lesions. Old ecchymoses in B pretibial areas  Neurologic: Sensation intact  Strength equal in all extremities Psychiatric:   Alertto self Normal mood.     Labs on Admission: I have personally reviewed following labs and imaging studies  CBC: Recent Labs  Lab 12/01/17 0539  WBC 15.5*  NEUTROABS 14.2*  HGB 13.2  HCT 39.2  MCV 94.5  PLT 166    Basic Metabolic Panel: Recent Labs  Lab 12/01/17 0539  NA 138  K 3.6  CL 103  CO2 28  GLUCOSE 135*  BUN 7*  CREATININE 0.85  CALCIUM 8.3*    GFR: Estimated Creatinine Clearance: 31.9 mL/min (by C-G formula based on SCr of 0.85 mg/dL).  Liver Function Tests: No results for input(s): AST, ALT, ALKPHOS, BILITOT, PROT, ALBUMIN in the last 168 hours. No results for input(s): LIPASE, AMYLASE in the last 168 hours. No results for input(s): AMMONIA in the last 168 hours.  Coagulation Profile: No results for input(s): INR, PROTIME in the last 168 hours.  Cardiac Enzymes: No results for input(s): CKTOTAL, CKMB, CKMBINDEX, TROPONINI in the last 168 hours.  BNP (last 3 results) No results for input(s): PROBNP in the last 8760 hours.  HbA1C: No results for input(s): HGBA1C in the last 72 hours.  CBG: No results for input(s): GLUCAP in the last 168 hours.  Lipid Profile: No results for input(s): CHOL, HDL, LDLCALC, TRIG, CHOLHDL, LDLDIRECT in the last 72 hours.  Thyroid Function Tests: No results for input(s): TSH, T4TOTAL, FREET4, T3FREE, THYROIDAB in the last 72 hours.  Anemia  Panel: No results for input(s): VITAMINB12, FOLATE, FERRITIN, TIBC, IRON, RETICCTPCT in the last 72 hours.  Urine analysis:    Component Value Date/Time   COLORURINE YELLOW 05/26/2017 1337   APPEARANCEUR HAZY (A) 05/26/2017 1337   LABSPEC 1.014 05/26/2017 1337   PHURINE 6.0 05/26/2017 1337   GLUCOSEU NEGATIVE 05/26/2017 1337   HGBUR NEGATIVE 05/26/2017 1337   BILIRUBINUR NEGATIVE 05/26/2017 1337   KETONESUR NEGATIVE 05/26/2017 1337   PROTEINUR NEGATIVE 05/26/2017 1337   UROBILINOGEN 0.2 04/06/2015 1722   NITRITE NEGATIVE 05/26/2017 1337   LEUKOCYTESUR NEGATIVE 05/26/2017 1337    Sepsis Labs: @LABRCNTIP (procalcitonin:4,lacticidven:4) )No results found for this or any previous visit (from the past 240 hour(s)).   Radiological Exams on Admission: Ct Pelvis Wo Contrast  Result Date: 12/01/2017 CLINICAL DATA:  84 y/o  F; left hip pain with painful left groin. EXAM: CT PELVIS WITHOUT CONTRAST TECHNIQUE: Multidetector CT imaging of the pelvis was performed following the standard protocol without intravenous contrast. COMPARISON:  08/31/2017 pelvis and right hip radiographs. FINDINGS: Urinary Tract:  No abnormality visualized. Bowel: Advanced sigmoid diverticulosis, no findings of acute diverticulitis. Vascular/Lymphatic: Calcific atherosclerosis of the aorta and bilateral iliofemoral arteries. Reproductive:  No mass or other significant abnormality Other:  None. Musculoskeletal: Acute minimally displaced fracture of left superior pubic ramus. Acute mildly displaced fracture of the left superior pubic ramus extending into the anterior column of acetabulum. No additional fracture or dislocation identified. IMPRESSION: 1. Acute minimally displaced fracture of left inferior pubic ramus. 2. Acute mildly displaced fracture of left superior pubic ramus extending into anterior column of acetabulum. 3. No additional fracture or dislocation identified. Electronically Signed   By: Kristine Garbe  M.D.   On: 12/01/2017 06:10   Dg Hip Unilat With Pelvis  2-3 Views Left  Result Date: 12/01/2017 CLINICAL DATA:  Left hip pain after a fall today. EXAM: DG HIP (WITH OR WITHOUT PELVIS) 2-3V LEFT COMPARISON:  None. FINDINGS: Displaced fracture of the base of the left superior pubic ramus with possible extension to the acetabulum. Nondisplaced fractures of the inferior pubic ramus. No fracture or dislocation identified in the left hip. Degenerative changes are present in both hips. SI joints and symphysis pubis are not displaced. Vascular calcifications. IMPRESSION: Acute fractures of the left superior and inferior pubic ramus with possible extension to the acetabulum. Degenerative changes in the hips. Electronically Signed   By: Lucienne Capers M.D.   On: 12/01/2017 05:08    EKG: Independently reviewed.  Assessment/Plan Principal Problem:   Fracture of pubic ramus (HCC) Active Problems:   Hypothyroidism   Dementia   Hyperlipidemia   Hypertension    Fall, recurrent , in a patient with Alzheimer's dementia.  Patient had a recent fall on 6/3 with 2 rib fractures, treated medically, now presenting with with pubic rami fracture extending to the acetabulum per CT pelvis without contrast    Received  IV pain meds, immobilized.  Patient does have pain in the left hip and groin.  White count is 15, likely reactive.  UA is pending.  Vital signs are stable, she is afebrile.  Orthopedic consultation is pending. Admit to med surg Hip fracture order set NPO for now with sips with meds and ice chips SCDs for now until consultation by orthopedics  IVF in view of leukocytosis Pain control with IV and oral meds PT/OT consult postop For now, nonweight bearing, until confirmed by orthopedics that the patient can do touchdown weightbearing  Follow UA results   Hypertension BP 142/74   Pulse 76 and not on meds Add Hydralazine Q6 hours as needed for BP 160/90    Hypothyroidism: Continue home  [place]  History of Dementia at baseline  Continue Celexa    DVT prophylaxis:  SCD for now Code Status:    NCB Family Communication:  Discussed with daughter  Disposition Plan: Expect patient to be discharged to memory care  home after condition improves Consults called:    Ortho, Dr. Lorin Mercy Admission status: Medsurg IP    Sharene Butters, PA-C [facility]   Amion text  [phone]   12/01/2017, 7:50 AM

## 2017-12-01 NOTE — ED Triage Notes (Signed)
Per EMS, pt fell at facility and left groin is painful. States that she was making a turn and fell. Oriented to self only.

## 2017-12-02 ENCOUNTER — Inpatient Hospital Stay (HOSPITAL_COMMUNITY): Payer: Medicare Other

## 2017-12-02 DIAGNOSIS — S32591A Other specified fracture of right pubis, initial encounter for closed fracture: Secondary | ICD-10-CM

## 2017-12-02 LAB — CBC
HEMATOCRIT: 39.1 % (ref 36.0–46.0)
HEMOGLOBIN: 12.3 g/dL (ref 12.0–15.0)
MCH: 30.9 pg (ref 26.0–34.0)
MCHC: 31.5 g/dL (ref 30.0–36.0)
MCV: 98.2 fL (ref 78.0–100.0)
Platelets: 237 10*3/uL (ref 150–400)
RBC: 3.98 MIL/uL (ref 3.87–5.11)
RDW: 16.4 % — AB (ref 11.5–15.5)
WBC: 8.8 10*3/uL (ref 4.0–10.5)

## 2017-12-02 LAB — BASIC METABOLIC PANEL
ANION GAP: 5 (ref 5–15)
BUN: 10 mg/dL (ref 8–23)
CALCIUM: 8.2 mg/dL — AB (ref 8.9–10.3)
CHLORIDE: 105 mmol/L (ref 98–111)
CO2: 30 mmol/L (ref 22–32)
Creatinine, Ser: 1.03 mg/dL — ABNORMAL HIGH (ref 0.44–1.00)
GFR calc Af Amer: 53 mL/min — ABNORMAL LOW (ref >60)
GFR calc non Af Amer: 46 mL/min — ABNORMAL LOW (ref >60)
GLUCOSE: 96 mg/dL (ref 70–99)
POTASSIUM: 4 mmol/L (ref 3.5–5.1)
Sodium: 140 mmol/L (ref 135–145)

## 2017-12-02 MED ORDER — IPRATROPIUM-ALBUTEROL 0.5-2.5 (3) MG/3ML IN SOLN
3.0000 mL | Freq: Three times a day (TID) | RESPIRATORY_TRACT | Status: DC
  Administered 2017-12-03: 3 mL via RESPIRATORY_TRACT
  Filled 2017-12-02 (×2): qty 3

## 2017-12-02 MED ORDER — ACETAMINOPHEN 500 MG PO TABS
500.0000 mg | ORAL_TABLET | Freq: Three times a day (TID) | ORAL | Status: DC
  Administered 2017-12-03 (×2): 500 mg via ORAL
  Filled 2017-12-02: qty 1

## 2017-12-02 MED ORDER — IPRATROPIUM-ALBUTEROL 0.5-2.5 (3) MG/3ML IN SOLN
3.0000 mL | Freq: Four times a day (QID) | RESPIRATORY_TRACT | Status: DC
  Administered 2017-12-02 (×2): 3 mL via RESPIRATORY_TRACT
  Filled 2017-12-02: qty 3

## 2017-12-02 NOTE — Evaluation (Addendum)
Occupational Therapy Evaluation Patient Details Name: Alice Hayes MRN: [id number] DOB: 01-18-1923 Today's Date: 12/02/2017    History of Present Illness Alice Hayes is a 84yo female who comes to [facility] on 7/3 after fall in memory care. Pt found to have sustained anterior Left pubic ramus fracture  and posterior left pubic ramus fracture. PTA pt hqas been in memory care x2 months, prior to that ILF for 6 months. The pt typcally is AMB with RW in facility, and requires supervision only for bed mobility and transfers. History of 2 additional falls, one with rib fractures in June 2019.    Clinical Impression   This 84 y/o female presents with the above. PLOF obtained via chart review due to pt with baseline cognitive deficits. Per chart review pt was residing in memory care unit of ALF, using RW for ambulation and was receiving some assist for ADLs. Pt presenting with increased pain and generalized weakness impacting her overall functional performance. Pt requiring totalA+2 for bed mobility this session, though not able to safely progress OOB this session as pt reporting significant dizziness initially with sitting EOB and pt also demonstrating strong posterior lean, not able to tolerate full upright sitting position despite max-totalA+2. Pt requiring setup/minguard assist for simple grooming ADLs at bed level, requires totalA for additional UB/LB ADLs at this time. Pt will benefit from continued acute OT services and recommend follow up OT services after discharge. Pending availability of care at pt's current ALF, it may be beneficial for pt to return to familiar facility with 24hr assist and [place] services. If current facility is not able to provide 24hr assist, may need to consider SNF. Will follow.     Follow Up Recommendations  SNF;Supervision/Assistance - 24 hour(pending availability of care at current ALF)    Equipment Recommendations  3 in 1 bedside commode;Other (comment)(to be further  assessed in next venue )           Precautions / Restrictions Precautions Precautions: Fall Restrictions Weight Bearing Restrictions: Yes LLE Weight Bearing: Touchdown weight bearing      Mobility Bed Mobility Overal bed mobility: Needs Assistance Bed Mobility: Supine to Sit;Sit to Supine     Supine to sit: Total assist;+2 for physical assistance;+2 for safety/equipment Sit to supine: Total assist;+2 for physical assistance;+2 for safety/equipment   General bed mobility comments: use of bed pad and "helicoptor method" to scoot hips and elevate trunk; attempted x2 during session. Pt with significant dizziness during first attempt and resistive to full upright sitting therefore returned to supine. During second trial pt able to tolerate a little better, though continues to demonstrate strong posterior lean and not able to safely achieve full upright sitting position. Returned to supine and positioned in bed for comfort.   Transfers                 General transfer comment: deferred at this time due to safety concerns     Balance Overall balance assessment: Needs assistance Sitting-balance support: Single extremity supported;Feet supported Sitting balance-Leahy Scale: Zero Sitting balance - Comments: strong posterior lean with sitting EOB despite max-totalA +2  Postural control: Posterior lean                                 ADL either performed or assessed with clinical judgement   ADL Overall ADL's : Needs assistance/impaired     Grooming: Wash/dry face;Set up;Bed level  General ADL Comments: Pt requiring totalA+2 for bed mobility to attempt sitting EOB, attempted x2. On first attempt pt reports significant dizziness and resistant to sitting upright requiring return to supine, BP taken and stable. Attempted again and pt able to tolerate (almost) upright sitting slightly longer, though with VERY strong  posterior lean and not able to tolerate full upright sitting, returned to supine and repositioned in bed; BP 181/83 (112) end of session, provided materials for simple grooming ADL and pt able to complete at bed level with cues      Vision   Additional Comments: per chart review pt with baseline visual deficits - to be further assessed      Perception     Praxis      Pertinent Vitals/Pain Pain Assessment: Faces Faces Pain Scale: Hurts even more Pain Location: Left hip/groin region  Pain Descriptors / Indicators: Grimacing;Discomfort Pain Intervention(s): Monitored during session;Limited activity within patient's tolerance;Repositioned     Hand Dominance     Extremity/Trunk Assessment Upper Extremity Assessment Upper Extremity Assessment: Generalized weakness   Lower Extremity Assessment Lower Extremity Assessment: Defer to PT evaluation       Communication Communication Communication: HOH;Other (comment)(per chart review, visual deficits)   Cognition Arousal/Alertness: Awake/alert Behavior During Therapy: WFL for tasks assessed/performed Overall Cognitive Status: History of cognitive impairments - at baseline                                 General Comments: Pt with baseline dementia, oriented to self, pleasantly confused during session; follows one step commands intermittently given increased time and cues, pt also HOH which suspect adds to pt's confusion    General Comments  BP end of session 181/83 (112)     Exercises     Shoulder Instructions      Home Living Family/patient expects to be discharged to:: Assisted living                             Home Equipment: Walker - 2 wheels   Additional Comments: now resides in memory care unit      Prior Functioning/Environment Level of Independence: Needs assistance  Gait / Transfers Assistance Needed: facility distances with RW  ADL's / Homemaking Assistance Needed: some assistance  needed   Comments: PLOF obtained via chart review         OT Problem List: Impaired balance (sitting and/or standing);Decreased strength;Decreased cognition;Pain;Decreased range of motion;Decreased activity tolerance      OT Treatment/Interventions: Self-care/ADL training;DME and/or AE instruction;Therapeutic activities;Balance training;Therapeutic exercise;Patient/family education    OT Goals(Current goals can be found in the care plan section) Acute Rehab OT Goals Patient Stated Goal: wants to return to home  OT Goal Formulation: With patient Time For Goal Achievement: 12/16/17 Potential to Achieve Goals: Good  OT Frequency: Min 2X/week   Barriers to D/C:            Co-evaluation              AM-PAC PT "6 Clicks" Daily Activity     Outcome Measure Help from another person eating meals?: A Lot Help from another person taking care of personal grooming?: A Little Help from another person toileting, which includes using toliet, bedpan, or urinal?: Total Help from another person bathing (including washing, rinsing, drying)?: Total Help from another person to put on and taking off regular upper body clothing?:  Total Help from another person to put on and taking off regular lower body clothing?: Total 6 Click Score: 9   End of Session Nurse Communication: Mobility status  Activity Tolerance: Patient tolerated treatment well;Patient limited by pain;Other (comment)(limited by dizziness ) Patient left: in bed;with call bell/phone within reach;with bed alarm set  OT Visit Diagnosis: History of falling (Z91.81);Pain;Muscle weakness (generalized) (M62.81) Pain - Right/Left: Left Pain - part of body: Hip                Time: 1020-1041 OT Time Calculation (min): 21 min Charges:  OT General Charges $OT Visit: 1 Visit OT Evaluation $OT Eval Moderate Complexity: 1 Mod G-Codes:     Lou Cal, OT Pager [phone] 12/02/2017   Raymondo Band 12/02/2017, 11:20 AM

## 2017-12-02 NOTE — Progress Notes (Signed)
Pt stable rx for pain med and lovenox on chart  F/u with me in 3 weeks tdwb for transfers only left leg

## 2017-12-02 NOTE — Social Work (Addendum)
CSW faxed FL2 to [place] at [facility] at [place]. [phone]. She is reviewing for [facility] services.   Alexander Mt, [place] Work [phone]

## 2017-12-02 NOTE — Progress Notes (Signed)
PROGRESS NOTE    Alice Hayes  ZOX:[id number] DOB: Dec 07, 1922 DOA: 12/01/2017 PCP: Vernie Shanks, MD    Brief Narrative:Alice Hayes is a 84 y.o. female with a Past Medical History of HLD; HTN; diastolic heart failure; COPD; CAD; and dementia who presents with a mechanical fall at her memory care center.  Xray/CT showed acute minimally displaced fracture of L inferior pubic ramus and acute mildly displaced fracture of L superior pubic ramus extending into anterior column of acetabulum.  She is very pleasant and conversant.  She is quite hard of hearing and somewhat confused but seems to remember her fall and that she has injured herself.  There is no obvious deformity although her left leg is somewhat shortened and mildly externally rotated.      Assessment & Plan:   Principal Problem:   Fracture of pubic ramus (HCC) Active Problems:   Hypothyroidism   Dementia   Hyperlipidemia   Hypertension  Pubic rami fractures; extending to acetabulum.  evaluated by Dr Nicki Reaper with Ortho/ non operative treatment. No weight bearing. Only partial weightbearing  for transfer.  Pain management.  Lovenox for DVT prophylaxis.   Acute hypoxia; mild  Incentive spirometry  Nebulizer for bronchospasm.  Check chest x ray.   Dementia;; stable.   HTN;   Hydralazine PRN  Hypothyroidism; continue with [place].    DVT prophylaxis: Lovenox Code Status: DNR Family Communication: 2 daughters at bedside.  Disposition Plan: to be determine.   Consultants:   Ortho    Procedures:   none   Antimicrobials:   none   Subjective: Eating lunch. Denies dyspnea. Complaint of left hip , pubic area pain.   Objective: Vitals:   12/01/17 0745 12/01/17 0833 12/02/17 0608 12/02/17 1100  BP: 134/73 (!) 153/71 133/77 134/73  Pulse: 80 72 79 73  Resp: (!) 27 16 16 18   Temp:  97.7 F (36.5 C) 98 F (36.7 C) 99.3 F (37.4 C)  TempSrc:  Oral Oral Oral  SpO2: 93% 93% 91% (!) 89%  Weight:   52.8 kg (116 lb 6.5 oz)    Height:  4\' 11"  (1.499 m)      Intake/Output Summary (Last 24 hours) at 12/02/2017 1355 Last data filed at 12/02/2017 0900 Gross per 24 hour  Intake 567.68 ml  Output -  Net 567.68 ml   Filed Weights   12/01/17 0417 12/01/17 0833  Weight: 54.9 kg (121 lb) 52.8 kg (116 lb 6.5 oz)    Examination:  General exam: Appears calm and comfortable  Respiratory system: bilateral ronchus.  Cardiovascular system: S1 & S2 heard, RRR. No JVD, murmurs, rubs, gallops or clicks. No pedal edema. Gastrointestinal system: Abdomen is nondistended, soft and nontender. No organomegaly or masses felt. Normal bowel sounds heard. Central nervous system: Alert  Extremities: Symmetric 5 x 5 power. Skin: No rashes, lesions or ulcers    Data Reviewed: I have personally reviewed following labs and imaging studies  CBC: Recent Labs  Lab 12/01/17 0539 12/02/17 0542  WBC 15.5* 8.8  NEUTROABS 14.2*  --   HGB 13.2 12.3  HCT 39.2 39.1  MCV 94.5 98.2  PLT 271 811   Basic Metabolic Panel: Recent Labs  Lab 12/01/17 0539 12/02/17 0542  NA 138 140  K 3.6 4.0  CL 103 105  CO2 28 30  GLUCOSE 135* 96  BUN 7* 10  CREATININE 0.85 1.03*  CALCIUM 8.3* 8.2*   GFR: Estimated Creatinine Clearance: 25.9 mL/min (A) (by C-G formula based on  SCr of 1.03 mg/dL (H)). Liver Function Tests: No results for input(s): AST, ALT, ALKPHOS, BILITOT, PROT, ALBUMIN in the last 168 hours. No results for input(s): LIPASE, AMYLASE in the last 168 hours. No results for input(s): AMMONIA in the last 168 hours. Coagulation Profile: Recent Labs  Lab 12/01/17 0803  INR 1.21   Cardiac Enzymes: No results for input(s): CKTOTAL, CKMB, CKMBINDEX, TROPONINI in the last 168 hours. BNP (last 3 results) No results for input(s): PROBNP in the last 8760 hours. HbA1C: No results for input(s): HGBA1C in the last 72 hours. CBG: No results for input(s): GLUCAP in the last 168 hours. Lipid Profile: No results  for input(s): CHOL, HDL, LDLCALC, TRIG, CHOLHDL, LDLDIRECT in the last 72 hours. Thyroid Function Tests: No results for input(s): TSH, T4TOTAL, FREET4, T3FREE, THYROIDAB in the last 72 hours. Anemia Panel: No results for input(s): VITAMINB12, FOLATE, FERRITIN, TIBC, IRON, RETICCTPCT in the last 72 hours. Sepsis Labs: No results for input(s): PROCALCITON, LATICACIDVEN in the last 168 hours.  No results found for this or any previous visit (from the past 240 hour(s)).       Radiology Studies: Ct Pelvis Wo Contrast  Result Date: 12/01/2017 CLINICAL DATA:  84 y/o  F; left hip pain with painful left groin. EXAM: CT PELVIS WITHOUT CONTRAST TECHNIQUE: Multidetector CT imaging of the pelvis was performed following the standard protocol without intravenous contrast. COMPARISON:  08/31/2017 pelvis and right hip radiographs. FINDINGS: Urinary Tract:  No abnormality visualized. Bowel: Advanced sigmoid diverticulosis, no findings of acute diverticulitis. Vascular/Lymphatic: Calcific atherosclerosis of the aorta and bilateral iliofemoral arteries. Reproductive:  No mass or other significant abnormality Other:  None. Musculoskeletal: Acute minimally displaced fracture of left superior pubic ramus. Acute mildly displaced fracture of the left superior pubic ramus extending into the anterior column of acetabulum. No additional fracture or dislocation identified. IMPRESSION: 1. Acute minimally displaced fracture of left inferior pubic ramus. 2. Acute mildly displaced fracture of left superior pubic ramus extending into anterior column of acetabulum. 3. No additional fracture or dislocation identified. Electronically Signed   By: Kristine Garbe M.D.   On: 12/01/2017 06:10   Dg Hip Unilat With Pelvis 2-3 Views Left  Result Date: 12/01/2017 CLINICAL DATA:  Left hip pain after a fall today. EXAM: DG HIP (WITH OR WITHOUT PELVIS) 2-3V LEFT COMPARISON:  None. FINDINGS: Displaced fracture of the base of the left  superior pubic ramus with possible extension to the acetabulum. Nondisplaced fractures of the inferior pubic ramus. No fracture or dislocation identified in the left hip. Degenerative changes are present in both hips. SI joints and symphysis pubis are not displaced. Vascular calcifications. IMPRESSION: Acute fractures of the left superior and inferior pubic ramus with possible extension to the acetabulum. Degenerative changes in the hips. Electronically Signed   By: Lucienne Capers M.D.   On: 12/01/2017 05:08        Scheduled Meds: . cholecalciferol  1,000 Units Oral Daily  . citalopram  20 mg Oral Daily  . enoxaparin (LOVENOX) injection  30 mg Subcutaneous Q24H  . ipratropium-albuterol  3 mL Nebulization Q6H  . thyroid  60 mg Oral QAC breakfast   Continuous Infusions:   LOS: 1 day    Time spent: 35 minutes.     Alice Shiley, MD [facility] Pager [phone]  If 7PM-7AM, please contact night-coverage [email or web address] Password [email or web address] 12/02/2017, 1:55 PM

## 2017-12-03 ENCOUNTER — Telehealth (INDEPENDENT_AMBULATORY_CARE_PROVIDER_SITE_OTHER): Payer: Self-pay | Admitting: Radiology

## 2017-12-03 MED ORDER — HYDROCODONE-ACETAMINOPHEN 5-325 MG PO TABS: 0.5000 | ORAL_TABLET | Freq: Four times a day (QID) | ORAL | Status: DC | PRN

## 2017-12-03 MED ORDER — BISACODYL 5 MG PO TBEC: 5.0000 mg | DELAYED_RELEASE_TABLET | Freq: Every day | ORAL | 0 refills | Status: AC | PRN

## 2017-12-03 MED ORDER — IPRATROPIUM-ALBUTEROL 0.5-2.5 (3) MG/3ML IN SOLN: 3.0000 mL | Freq: Two times a day (BID) | RESPIRATORY_TRACT | 0 refills | Status: AC

## 2017-12-03 MED ORDER — SENNOSIDES-DOCUSATE SODIUM 8.6-50 MG PO TABS: 1.0000 | ORAL_TABLET | Freq: Every day | ORAL | Status: DC

## 2017-12-03 MED ORDER — HYDROCODONE-ACETAMINOPHEN 5-325 MG PO TABS: 0.5000 | ORAL_TABLET | Freq: Four times a day (QID) | ORAL | 0 refills | Status: AC | PRN

## 2017-12-03 MED ORDER — IPRATROPIUM-ALBUTEROL 0.5-2.5 (3) MG/3ML IN SOLN: 3.0000 mL | Freq: Two times a day (BID) | RESPIRATORY_TRACT | Status: DC

## 2017-12-03 MED ORDER — SENNOSIDES-DOCUSATE SODIUM 8.6-50 MG PO TABS: 1.0000 | ORAL_TABLET | Freq: Every day | ORAL | 0 refills | Status: AC

## 2017-12-03 MED ORDER — ENOXAPARIN SODIUM 30 MG/0.3ML ~~LOC~~ SOLN: 30.0000 mg | SUBCUTANEOUS | 0 refills | Status: DC

## 2017-12-03 MED ORDER — POLYETHYLENE GLYCOL 3350 17 G PO PACK
17.0000 g | PACK | Freq: Every day | ORAL | Status: DC
  Administered 2017-12-03: 17 g via ORAL
  Filled 2017-12-03: qty 1

## 2017-12-03 MED ORDER — POLYETHYLENE GLYCOL 3350 17 G PO PACK: 17.0000 g | PACK | Freq: Every day | ORAL | 0 refills | Status: AC

## 2017-12-03 NOTE — Telephone Encounter (Signed)
Leatrice Jewels, RN from [employer] at [facility] called stating Charity from [place] states they are unable to give patient Lovenox at their facility and requests order for po medication to be faxed to her at [phone].  Per Dr. Marlou Sa, ok for patient to take Aspirin 81mg  bid until follow up in office.   Script faxed to [place] at above number.

## 2017-12-03 NOTE — Social Work (Signed)
Clinical Social Worker facilitated patient discharge including contacting patient family and facility to confirm patient discharge plans.  Clinical information faxed to facility and family agreeable with plan.    CSW arranged ambulance transport via [facility] to [place], [place] .    RN to call [phone] to give report prior to discharge.  Clinical Social Worker will sign off for now as social work intervention is no longer needed. Please consult [place] again if new need arises.  Elissa Hefty, LCSW Clinical Social Worker [phone]

## 2017-12-03 NOTE — Discharge Summary (Addendum)
Physician Discharge Summary  Alice Hayes YCX:[id number] DOB: [birth year] DOA: 12/01/2017  PCP: Alice Shanks, MD  Admit date: 12/01/2017 Discharge date: 12/03/2017  Admitted From: ALF Disposition:  ALF with HH.   Recommendations for Outpatient Follow-up:  1. Follow up with PCP in 1-2 weeks 2. Please obtain BMP/CBC in one week 3. Needs to follow up with Dr Alice Hayes in 3 weeks.  4. Needs lovenox for 3 weeks until follow up with ortho.   Home Health: yes  Discharge Condition: stable.  CODE STATUS: DNR Diet recommendation: Heart Healthy  Brief/Interim Summary: Alice Gudger Rabinovitzis a 84 y.o.femalewith a Past Medical History of HLD; HTN; diastolic heart failure; COPD; CAD; and dementia who presents with a mechanical fall at her memory care center. Xray/CT showed acute minimally displaced fracture of L inferior pubic ramus and acute mildly displaced fracture of L superior pubic ramus extending into anterior column of acetabulum. She is very pleasant and conversant. She is quite hard of hearing and somewhat confused but seems to remember her fall and that she has injured herself. There is no obvious deformity although her left leg is somewhat shortened and mildly externally rotated.     Assessment & Plan:   Principal Problem:   Fracture of pubic ramus (HCC) Active Problems:   Hypothyroidism   Dementia   Hyperlipidemia   Hypertension  Pubic rami fractures; extending to acetabulum.  evaluated by Dr Alice Hayes with Ortho/ non operative treatment. No weight bearing. Only partial weightbearing  for transfer.  Pain management.  Lovenox for DVT prophylaxis.  Follow up with ortho in 3 weeks.  Bowel regimen.   Acute hypoxia; mild ; improved.  Incentive spirometry  Nebulizer for bronchospasm. Improved.  Check chest x ray. Negative for pneumonia.   Dementia;; stable.   HTN;   Hydralazine PRN  Hypothyroidism; continue with [place].   Chronic diastolic HF; stable.     Discharge Diagnoses:  Principal Problem:   Fracture of pubic ramus ([place]) Active Problems:   Hypothyroidism   Dementia   Hyperlipidemia   Hypertension    Discharge Instructions   Allergies as of 12/03/2017      Reactions   Voltaren Ophthalmic [diclofenac] Other (See Comments)   Reaction not recalled by family ??      Medication List    STOP taking these medications   ALKA-SELTZER ORIGINAL 325 MG Tbef tablet Generic drug:  aspirin-sod bicarb-citric acid   sulfamethoxazole-trimethoprim 800-160 MG tablet Commonly known as:  BACTRIM DS,SEPTRA DS   traMADol 50 MG tablet Commonly known as:  ULTRAM     TAKE these medications   bisacodyl 5 MG EC tablet Commonly known as:  DULCOLAX Take 1 tablet (5 mg total) by mouth daily as needed for moderate constipation.   CALCIUM 600+D3 600-200 MG-UNIT Tabs Generic drug:  Calcium Carb-Cholecalciferol Take 1 tablet by mouth daily.   citalopram 20 MG tablet Commonly known as:  CELEXA Take 20 mg by mouth daily.   Co Q 10 10 MG Caps Take 10 mg by mouth daily.   enoxaparin 30 MG/0.3ML injection Commonly known as:  LOVENOX Inject 0.3 mLs (30 mg total) into the skin daily.   HYDROcodone-acetaminophen 5-325 MG tablet Commonly known as:  NORCO Take 0.5 tablets by mouth every 6 (six) hours as needed for moderate pain or severe pain. What changed:    how much to take  reasons to take this   ipratropium-albuterol 0.5-2.5 (3) MG/3ML Soln Commonly known as:  DUONEB Take 3 mLs by nebulization 2 (  two) times daily.   NP THYROID 60 MG tablet Generic drug:  thyroid Take 60 mg by mouth daily.   OVER THE COUNTER MEDICATION "ICAPS Multivitamin + Lutein tablets: Take 1 tablet by mouth once a day   polyethylene glycol packet Commonly known as:  MIRALAX / GLYCOLAX Take 17 g by mouth daily.   senna-docusate 8.6-50 MG tablet Commonly known as:  Senokot-S Take 1 tablet by mouth at bedtime.   Vitamin D-3 1000 units Caps Take 1,000  Units by mouth daily.       Allergies  Allergen Reactions  . Voltaren Ophthalmic [Diclofenac] Other (See Comments)    Reaction not recalled by family ??    Consultations:  Dr Alice Hayes   Procedures/Studies: Ct Pelvis Wo Contrast  Result Date: 12/01/2017 CLINICAL DATA:  84 y/o  F; left hip pain with painful left groin. EXAM: CT PELVIS WITHOUT CONTRAST TECHNIQUE: Multidetector CT imaging of the pelvis was performed following the standard protocol without intravenous contrast. COMPARISON:  08/31/2017 pelvis and right hip radiographs. FINDINGS: Urinary Tract:  No abnormality visualized. Bowel: Advanced sigmoid diverticulosis, no findings of acute diverticulitis. Vascular/Lymphatic: Calcific atherosclerosis of the aorta and bilateral iliofemoral arteries. Reproductive:  No mass or other significant abnormality Other:  None. Musculoskeletal: Acute minimally displaced fracture of left superior pubic ramus. Acute mildly displaced fracture of the left superior pubic ramus extending into the anterior column of acetabulum. No additional fracture or dislocation identified. IMPRESSION: 1. Acute minimally displaced fracture of left inferior pubic ramus. 2. Acute mildly displaced fracture of left superior pubic ramus extending into anterior column of acetabulum. 3. No additional fracture or dislocation identified. Electronically Signed   By: Kristine Garbe M.D.   On: 12/01/2017 06:10   Dg Chest Port 1 View  Result Date: 12/02/2017 CLINICAL DATA:  Hypoxemia EXAM: PORTABLE CHEST 1 VIEW COMPARISON:  11/02/2017 and prior exams FINDINGS: Cardiomegaly noted. Pulmonary vascular congestion noted with bibasilar atelectasis. There is no evidence of focal airspace disease, pulmonary edema, suspicious pulmonary nodule/mass, pleural effusion, or pneumothorax. No acute bony abnormalities are identified. IMPRESSION: Cardiomegaly with pulmonary vascular congestion and bibasilar atelectasis. Electronically Signed   By:  Margarette Canada M.D.   On: 12/02/2017 15:31   Dg Hip Unilat With Pelvis 2-3 Views Left  Result Date: 12/01/2017 CLINICAL DATA:  Left hip pain after a fall today. EXAM: DG HIP (WITH OR WITHOUT PELVIS) 2-3V LEFT COMPARISON:  None. FINDINGS: Displaced fracture of the base of the left superior pubic ramus with possible extension to the acetabulum. Nondisplaced fractures of the inferior pubic ramus. No fracture or dislocation identified in the left hip. Degenerative changes are present in both hips. SI joints and symphysis pubis are not displaced. Vascular calcifications. IMPRESSION: Acute fractures of the left superior and inferior pubic ramus with possible extension to the acetabulum. Degenerative changes in the hips. Electronically Signed   By: Lucienne Capers M.D.   On: 12/01/2017 05:08      Subjective: Feeling ok, pain controlled. Denies dyspnea  Discharge Exam: Vitals:   12/03/17 0613 12/03/17 0851  BP: (!) 172/94   Pulse: 76 68  Resp: 17 16  Temp: (!) 97.4 F (36.3 C)   SpO2: 93% 94%   Vitals:   12/02/17 2039 12/02/17 2126 12/03/17 0613 12/03/17 0851  BP:  (!) 173/73 (!) 172/94   Pulse:  71 76 68  Resp:  16 17 16   Temp:  98.3 F (36.8 C) (!) 97.4 F (36.3 C)   TempSrc:  Oral Oral  SpO2: 95% 94% 93% 94%  Weight:      Height:        General: Pt is alert, awake, not in acute distress Cardiovascular: RRR, S1/S2 +, no rubs, no gallops Respiratory: CTA bilaterally, no wheezing, sporadic  rhonchi Abdominal: Soft, NT, ND, bowel sounds + Extremities: no edema, no cyanosis    The results of significant diagnostics from this hospitalization (including imaging, microbiology, ancillary and laboratory) are listed below for reference.     Microbiology: No results found for this or any previous visit (from the past 240 hour(s)).   Labs: BNP (last 3 results) No results for input(s): BNP in the last 8760 hours. Basic Metabolic Panel: Recent Labs  Lab 12/01/17 0539 12/02/17 0542   NA 138 140  K 3.6 4.0  CL 103 105  CO2 28 30  GLUCOSE 135* 96  BUN 7* 10  CREATININE 0.85 1.03*  CALCIUM 8.3* 8.2*   Liver Function Tests: No results for input(s): AST, ALT, ALKPHOS, BILITOT, PROT, ALBUMIN in the last 168 hours. No results for input(s): LIPASE, AMYLASE in the last 168 hours. No results for input(s): AMMONIA in the last 168 hours. CBC: Recent Labs  Lab 12/01/17 0539 12/02/17 0542  WBC 15.5* 8.8  NEUTROABS 14.2*  --   HGB 13.2 12.3  HCT 39.2 39.1  MCV 94.5 98.2  PLT 271 237   Cardiac Enzymes: No results for input(s): CKTOTAL, CKMB, CKMBINDEX, TROPONINI in the last 168 hours. BNP: Invalid input(s): POCBNP CBG: No results for input(s): GLUCAP in the last 168 hours. D-Dimer No results for input(s): DDIMER in the last 72 hours. Hgb A1c No results for input(s): HGBA1C in the last 72 hours. Lipid Profile No results for input(s): CHOL, HDL, LDLCALC, TRIG, CHOLHDL, LDLDIRECT in the last 72 hours. Thyroid function studies No results for input(s): TSH, T4TOTAL, T3FREE, THYROIDAB in the last 72 hours.  Invalid input(s): FREET3 Anemia work up No results for input(s): VITAMINB12, FOLATE, FERRITIN, TIBC, IRON, RETICCTPCT in the last 72 hours. Urinalysis    Component Value Date/Time   COLORURINE YELLOW 12/01/2017 1528   APPEARANCEUR CLOUDY (A) 12/01/2017 1528   LABSPEC 1.010 12/01/2017 1528   PHURINE 9.0 (H) 12/01/2017 1528   GLUCOSEU NEGATIVE 12/01/2017 1528   HGBUR NEGATIVE 12/01/2017 1528   BILIRUBINUR NEGATIVE 12/01/2017 1528   KETONESUR NEGATIVE 12/01/2017 1528   PROTEINUR NEGATIVE 12/01/2017 1528   UROBILINOGEN 0.2 04/06/2015 1722   NITRITE NEGATIVE 12/01/2017 1528   LEUKOCYTESUR NEGATIVE 12/01/2017 1528   Sepsis Labs Invalid input(s): PROCALCITONIN,  WBC,  LACTICIDVEN Microbiology No results found for this or any previous visit (from the past 240 hour(s)).   Time coordinating discharge: 45 minutes   SIGNED:   Elmarie Shiley, MD  [facility] 12/03/2017, 10:20 AM Pager [phone]  If 7PM-7AM, please contact night-coverage [email or web address] Password [email or web address]

## 2017-12-03 NOTE — Social Work (Addendum)
CSW f/u on disposition.  CSW called Charity at [employer] and left message. CSW also faxed dc summary.  CSW will continue to follow up for disposition.  12:05pm: CSW called Charity and she confirmed receipt of dc summary and indicated that patient can return.  Elissa Hefty, LCSW Clinical Social Worker [phone]

## 2017-12-03 NOTE — Clinical Social Work Placement (Addendum)
   CLINICAL SOCIAL WORK PLACEMENT  NOTE  Date:  12/03/2017  Patient Details  Name: Alice Hayes MRN: [id number] Date of Birth: 09/11/1922  Clinical Social Work is seeking post-discharge placement for this patient at the [place] level of care (*CSW will initial, date and re-position this form in  chart as items are completed):  Yes   Patient/family provided with [place] Work Department's list of facilities offering this level of care within the geographic area requested by the patient (or if unable, by the patient's family).  Yes   Patient/family informed of their freedom to choose among providers that offer the needed level of care, that participate in Medicare, Medicaid or managed care program needed by the patient, have an available bed and are willing to accept the patient.  Yes   Patient/family informed of [facility]'s ownership interest in [facility] and [facility], as well as of the fact that they are under no obligation to receive care at these facilities.  PASRR submitted to EDS on       PASRR number received on       Existing PASRR number confirmed on       FL2 transmitted to all facilities in geographic area requested by pt/family on       FL2 transmitted to all facilities within larger geographic area on       Patient informed that his/her managed care company has contracts with or will negotiate with certain facilities, including the following:            Patient/family informed of bed offers received.  Patient chooses bed at [facility]     Physician recommends and patient chooses bed at      Patient to be transferred to [facility] on 12/03/17.  Patient to be transferred to facility by [facility]     Patient family notified on 12/03/17 of transfer.  Name of family member notified:  daughter Alice Hayes contacted     PHYSICIAN       Additional Comment:     _______________________________________________ Normajean Baxter, LCSW 12/03/2017, 12:06 PM

## 2017-12-03 NOTE — Progress Notes (Signed)
Physical Therapy Treatment Patient Details Name: Alice Hayes MRN: [id number] DOB: 12-Sep-1922 Today's Date: 12/03/2017    History of Present Illness Alice Hayes is a 84yo female who comes to [facility] on 7/[facility] after fall in memory care. Pt found to have sustained anterior Left pubic ramus fracture  and posterior left pubic ramus fracture. PTA pt hqas been in memory care x2 months, prior to that ILF for 6 months. The pt typcally is AMB with RW in facility, and requires supervision only for bed mobility and transfers. History of 2 additional falls, one with rib fractures in June 2019.     PT Comments    Patient with seemingly improved pain control and progression towards her goals today. Able to transition from sit to standing at edge of bed with two person minimal assistance. Ambulation not safe to attempt at this point due to patient's inability to maintain TDWB status secondary to confusion. Will likely benefit from use of a wheelchair for mobility until weightbearing status change; per discussion with CM, [name], wheelchair unable to be delivered today but recommend obtaining recommendation once returned to ALF.   Follow Up Recommendations  Home health PT;Supervision for mobility/OOB((return to ALF))     Equipment Recommendations  None recommended by PT   Recommendations for Other Services       Precautions / Restrictions Precautions Precautions: Fall Restrictions Weight Bearing Restrictions: Yes LLE Weight Bearing: Touchdown weight bearing    Mobility  Bed Mobility Overal bed mobility: Needs Assistance Bed Mobility: Supine to Sit;Sit to Supine     Supine to sit: Max assist;+2 for physical assistance Sit to supine: Min assist;+2 for safety/equipment   General bed mobility comments: use of bed pad and "helicopter method" to scoot hips and elevate trunk to sitting. however, with sit to supine, patient able to complete with min assist for LLE management  Transfers Overall  transfer level: Needs assistance Equipment used: 2 person hand held assist Transfers: Sit to/from Stand Sit to Stand: Min assist;Mod assist;+2 physical assistance         General transfer comment: min-mod assist to boost up to standing x 2 at edge of bed. patient with increased knee flexion but no knee buckle  Ambulation/Gait                 Stairs             Wheelchair Mobility    Modified Rankin (Stroke Patients Only)       Balance Overall balance assessment: Needs assistance Sitting-balance support: Bilateral upper extremity supported;Feet supported Sitting balance-Leahy Scale: Fair     Standing balance support: Bilateral upper extremity supported Standing balance-Leahy Scale: Poor                              Cognition Arousal/Alertness: Awake/alert Behavior During Therapy: WFL for tasks assessed/performed Overall Cognitive Status: History of cognitive impairments - at baseline                                 General Comments: Pt with baseline dementia, oriented to self, pleasantly confused during session; follows one step commands intermittently given increased time and cues, pt also HOH which suspect adds to pt's confusion       Exercises General Exercises - Lower Extremity Long Arc Quad: 10 reps;Left;Seated Hip Flexion/Marching: Seated;10 reps;Left Other Exercises Other Exercises: Seated towel squeezes for  hip adduction x 10    General Comments        Pertinent Vitals/Pain Pain Assessment: Faces Faces Pain Scale: Hurts even more Pain Location: Left hip/groin region  Pain Descriptors / Indicators: Grimacing;Discomfort Pain Intervention(s): Limited activity within patient's tolerance;Monitored during session    Home Living                      Prior Function            PT Goals (current goals can now be found in the care plan section) Acute Rehab PT Goals PT Goal Formulation: With patient Time  For Goal Achievement: 12/15/17 Potential to Achieve Goals: Fair Progress towards PT goals: Progressing toward goals    Frequency    Min 2X/week      PT Plan Current plan remains appropriate    Co-evaluation              AM-PAC PT "6 Clicks" Daily Activity  Outcome Measure  Difficulty turning over in bed (including adjusting bedclothes, sheets and blankets)?: Unable Difficulty moving from lying on back to sitting on the side of the bed? : Unable Difficulty sitting down on and standing up from a chair with arms (e.g., wheelchair, bedside commode, etc,.)?: Unable Help needed moving to and from a bed to chair (including a wheelchair)?: A Lot Help needed walking in hospital room?: Total Help needed climbing 3-5 steps with a railing? : Total 6 Click Score: 7    End of Session Equipment Utilized During Treatment: Gait belt Activity Tolerance: Patient limited by pain Patient left: in bed;with bed alarm set;with call bell/phone within reach;with family/visitor present Nurse Communication: Mobility status PT Visit Diagnosis: Unsteadiness on feet (R26.81);Other abnormalities of gait and mobility (R26.89);Difficulty in walking, not elsewhere classified (R26.2);Muscle weakness (generalized) (M62.81)     Time: 4975-3005 PT Time Calculation (min) (ACUTE ONLY): 22 min  Charges:  $Therapeutic Exercise: 8-22 mins                    G Codes:      Alice Hayes, PT, DPT Acute Rehabilitation Services  Pager: [phone]    Alice Hayes 12/03/2017, 12:37 PM

## 2017-12-13 DIAGNOSIS — R269 Unspecified abnormalities of gait and mobility: Secondary | ICD-10-CM | POA: Diagnosis not present

## 2017-12-13 DIAGNOSIS — Z79899 Other long term (current) drug therapy: Secondary | ICD-10-CM | POA: Diagnosis not present

## 2017-12-13 DIAGNOSIS — I509 Heart failure, unspecified: Secondary | ICD-10-CM | POA: Diagnosis not present

## 2017-12-13 DIAGNOSIS — J449 Chronic obstructive pulmonary disease, unspecified: Secondary | ICD-10-CM | POA: Diagnosis not present

## 2017-12-13 DIAGNOSIS — R4189 Other symptoms and signs involving cognitive functions and awareness: Secondary | ICD-10-CM | POA: Diagnosis not present

## 2017-12-14 DIAGNOSIS — S32512D Fracture of superior rim of left pubis, subsequent encounter for fracture with routine healing: Secondary | ICD-10-CM | POA: Diagnosis not present

## 2017-12-14 DIAGNOSIS — H353232 Exudative age-related macular degeneration, bilateral, with inactive choroidal neovascularization: Secondary | ICD-10-CM | POA: Diagnosis not present

## 2017-12-14 DIAGNOSIS — J449 Chronic obstructive pulmonary disease, unspecified: Secondary | ICD-10-CM | POA: Diagnosis not present

## 2017-12-14 DIAGNOSIS — M1610 Unilateral primary osteoarthritis, unspecified hip: Secondary | ICD-10-CM | POA: Diagnosis not present

## 2017-12-14 DIAGNOSIS — I5030 Unspecified diastolic (congestive) heart failure: Secondary | ICD-10-CM | POA: Diagnosis not present

## 2017-12-14 DIAGNOSIS — I110 Hypertensive heart disease with heart failure: Secondary | ICD-10-CM | POA: Diagnosis not present

## 2017-12-15 DIAGNOSIS — Z79899 Other long term (current) drug therapy: Secondary | ICD-10-CM | POA: Diagnosis not present

## 2017-12-19 DIAGNOSIS — J449 Chronic obstructive pulmonary disease, unspecified: Secondary | ICD-10-CM | POA: Diagnosis not present

## 2017-12-19 DIAGNOSIS — M1610 Unilateral primary osteoarthritis, unspecified hip: Secondary | ICD-10-CM | POA: Diagnosis not present

## 2017-12-19 DIAGNOSIS — I110 Hypertensive heart disease with heart failure: Secondary | ICD-10-CM | POA: Diagnosis not present

## 2017-12-19 DIAGNOSIS — I5030 Unspecified diastolic (congestive) heart failure: Secondary | ICD-10-CM | POA: Diagnosis not present

## 2017-12-19 DIAGNOSIS — S32512D Fracture of superior rim of left pubis, subsequent encounter for fracture with routine healing: Secondary | ICD-10-CM | POA: Diagnosis not present

## 2017-12-19 DIAGNOSIS — H353232 Exudative age-related macular degeneration, bilateral, with inactive choroidal neovascularization: Secondary | ICD-10-CM | POA: Diagnosis not present

## 2017-12-21 DIAGNOSIS — I5030 Unspecified diastolic (congestive) heart failure: Secondary | ICD-10-CM | POA: Diagnosis not present

## 2017-12-21 DIAGNOSIS — S32512D Fracture of superior rim of left pubis, subsequent encounter for fracture with routine healing: Secondary | ICD-10-CM | POA: Diagnosis not present

## 2017-12-21 DIAGNOSIS — J449 Chronic obstructive pulmonary disease, unspecified: Secondary | ICD-10-CM | POA: Diagnosis not present

## 2017-12-21 DIAGNOSIS — I110 Hypertensive heart disease with heart failure: Secondary | ICD-10-CM | POA: Diagnosis not present

## 2017-12-21 DIAGNOSIS — M1610 Unilateral primary osteoarthritis, unspecified hip: Secondary | ICD-10-CM | POA: Diagnosis not present

## 2017-12-21 DIAGNOSIS — H353232 Exudative age-related macular degeneration, bilateral, with inactive choroidal neovascularization: Secondary | ICD-10-CM | POA: Diagnosis not present

## 2017-12-22 DIAGNOSIS — J449 Chronic obstructive pulmonary disease, unspecified: Secondary | ICD-10-CM | POA: Diagnosis not present

## 2017-12-22 DIAGNOSIS — H353232 Exudative age-related macular degeneration, bilateral, with inactive choroidal neovascularization: Secondary | ICD-10-CM | POA: Diagnosis not present

## 2017-12-22 DIAGNOSIS — I5030 Unspecified diastolic (congestive) heart failure: Secondary | ICD-10-CM | POA: Diagnosis not present

## 2017-12-22 DIAGNOSIS — S32512D Fracture of superior rim of left pubis, subsequent encounter for fracture with routine healing: Secondary | ICD-10-CM | POA: Diagnosis not present

## 2017-12-22 DIAGNOSIS — M1610 Unilateral primary osteoarthritis, unspecified hip: Secondary | ICD-10-CM | POA: Diagnosis not present

## 2017-12-22 DIAGNOSIS — I110 Hypertensive heart disease with heart failure: Secondary | ICD-10-CM | POA: Diagnosis not present

## 2017-12-23 DIAGNOSIS — H353232 Exudative age-related macular degeneration, bilateral, with inactive choroidal neovascularization: Secondary | ICD-10-CM | POA: Diagnosis not present

## 2017-12-23 DIAGNOSIS — S32512D Fracture of superior rim of left pubis, subsequent encounter for fracture with routine healing: Secondary | ICD-10-CM | POA: Diagnosis not present

## 2017-12-23 DIAGNOSIS — I5030 Unspecified diastolic (congestive) heart failure: Secondary | ICD-10-CM | POA: Diagnosis not present

## 2017-12-23 DIAGNOSIS — I110 Hypertensive heart disease with heart failure: Secondary | ICD-10-CM | POA: Diagnosis not present

## 2017-12-23 DIAGNOSIS — M1610 Unilateral primary osteoarthritis, unspecified hip: Secondary | ICD-10-CM | POA: Diagnosis not present

## 2017-12-23 DIAGNOSIS — J449 Chronic obstructive pulmonary disease, unspecified: Secondary | ICD-10-CM | POA: Diagnosis not present

## 2017-12-24 DIAGNOSIS — I110 Hypertensive heart disease with heart failure: Secondary | ICD-10-CM | POA: Diagnosis not present

## 2017-12-24 DIAGNOSIS — M1610 Unilateral primary osteoarthritis, unspecified hip: Secondary | ICD-10-CM | POA: Diagnosis not present

## 2017-12-24 DIAGNOSIS — I5030 Unspecified diastolic (congestive) heart failure: Secondary | ICD-10-CM | POA: Diagnosis not present

## 2017-12-24 DIAGNOSIS — J449 Chronic obstructive pulmonary disease, unspecified: Secondary | ICD-10-CM | POA: Diagnosis not present

## 2017-12-24 DIAGNOSIS — S32512D Fracture of superior rim of left pubis, subsequent encounter for fracture with routine healing: Secondary | ICD-10-CM | POA: Diagnosis not present

## 2017-12-24 DIAGNOSIS — H353232 Exudative age-related macular degeneration, bilateral, with inactive choroidal neovascularization: Secondary | ICD-10-CM | POA: Diagnosis not present

## 2017-12-27 DIAGNOSIS — I5030 Unspecified diastolic (congestive) heart failure: Secondary | ICD-10-CM | POA: Diagnosis not present

## 2017-12-27 DIAGNOSIS — H353232 Exudative age-related macular degeneration, bilateral, with inactive choroidal neovascularization: Secondary | ICD-10-CM | POA: Diagnosis not present

## 2017-12-27 DIAGNOSIS — I110 Hypertensive heart disease with heart failure: Secondary | ICD-10-CM | POA: Diagnosis not present

## 2017-12-27 DIAGNOSIS — S32512D Fracture of superior rim of left pubis, subsequent encounter for fracture with routine healing: Secondary | ICD-10-CM | POA: Diagnosis not present

## 2017-12-27 DIAGNOSIS — M1610 Unilateral primary osteoarthritis, unspecified hip: Secondary | ICD-10-CM | POA: Diagnosis not present

## 2017-12-27 DIAGNOSIS — J449 Chronic obstructive pulmonary disease, unspecified: Secondary | ICD-10-CM | POA: Diagnosis not present

## 2017-12-28 DIAGNOSIS — I110 Hypertensive heart disease with heart failure: Secondary | ICD-10-CM | POA: Diagnosis not present

## 2017-12-28 DIAGNOSIS — J449 Chronic obstructive pulmonary disease, unspecified: Secondary | ICD-10-CM | POA: Diagnosis not present

## 2017-12-28 DIAGNOSIS — S32512D Fracture of superior rim of left pubis, subsequent encounter for fracture with routine healing: Secondary | ICD-10-CM | POA: Diagnosis not present

## 2017-12-28 DIAGNOSIS — M1610 Unilateral primary osteoarthritis, unspecified hip: Secondary | ICD-10-CM | POA: Diagnosis not present

## 2017-12-28 DIAGNOSIS — I5030 Unspecified diastolic (congestive) heart failure: Secondary | ICD-10-CM | POA: Diagnosis not present

## 2017-12-28 DIAGNOSIS — H353232 Exudative age-related macular degeneration, bilateral, with inactive choroidal neovascularization: Secondary | ICD-10-CM | POA: Diagnosis not present

## 2017-12-30 DIAGNOSIS — J449 Chronic obstructive pulmonary disease, unspecified: Secondary | ICD-10-CM | POA: Diagnosis not present

## 2017-12-30 DIAGNOSIS — I5030 Unspecified diastolic (congestive) heart failure: Secondary | ICD-10-CM | POA: Diagnosis not present

## 2017-12-30 DIAGNOSIS — M1610 Unilateral primary osteoarthritis, unspecified hip: Secondary | ICD-10-CM | POA: Diagnosis not present

## 2017-12-30 DIAGNOSIS — I110 Hypertensive heart disease with heart failure: Secondary | ICD-10-CM | POA: Diagnosis not present

## 2017-12-30 DIAGNOSIS — H353232 Exudative age-related macular degeneration, bilateral, with inactive choroidal neovascularization: Secondary | ICD-10-CM | POA: Diagnosis not present

## 2017-12-30 DIAGNOSIS — S32512D Fracture of superior rim of left pubis, subsequent encounter for fracture with routine healing: Secondary | ICD-10-CM | POA: Diagnosis not present

## 2017-12-31 DIAGNOSIS — M1610 Unilateral primary osteoarthritis, unspecified hip: Secondary | ICD-10-CM | POA: Diagnosis not present

## 2017-12-31 DIAGNOSIS — S32512D Fracture of superior rim of left pubis, subsequent encounter for fracture with routine healing: Secondary | ICD-10-CM | POA: Diagnosis not present

## 2017-12-31 DIAGNOSIS — I110 Hypertensive heart disease with heart failure: Secondary | ICD-10-CM | POA: Diagnosis not present

## 2017-12-31 DIAGNOSIS — I5030 Unspecified diastolic (congestive) heart failure: Secondary | ICD-10-CM | POA: Diagnosis not present

## 2017-12-31 DIAGNOSIS — J449 Chronic obstructive pulmonary disease, unspecified: Secondary | ICD-10-CM | POA: Diagnosis not present

## 2017-12-31 DIAGNOSIS — H353232 Exudative age-related macular degeneration, bilateral, with inactive choroidal neovascularization: Secondary | ICD-10-CM | POA: Diagnosis not present

## 2018-01-03 DIAGNOSIS — M1610 Unilateral primary osteoarthritis, unspecified hip: Secondary | ICD-10-CM | POA: Diagnosis not present

## 2018-01-03 DIAGNOSIS — I110 Hypertensive heart disease with heart failure: Secondary | ICD-10-CM | POA: Diagnosis not present

## 2018-01-03 DIAGNOSIS — H353232 Exudative age-related macular degeneration, bilateral, with inactive choroidal neovascularization: Secondary | ICD-10-CM | POA: Diagnosis not present

## 2018-01-03 DIAGNOSIS — I5030 Unspecified diastolic (congestive) heart failure: Secondary | ICD-10-CM | POA: Diagnosis not present

## 2018-01-03 DIAGNOSIS — J449 Chronic obstructive pulmonary disease, unspecified: Secondary | ICD-10-CM | POA: Diagnosis not present

## 2018-01-03 DIAGNOSIS — S32512D Fracture of superior rim of left pubis, subsequent encounter for fracture with routine healing: Secondary | ICD-10-CM | POA: Diagnosis not present

## 2018-01-04 DIAGNOSIS — S32512D Fracture of superior rim of left pubis, subsequent encounter for fracture with routine healing: Secondary | ICD-10-CM | POA: Diagnosis not present

## 2018-01-04 DIAGNOSIS — I110 Hypertensive heart disease with heart failure: Secondary | ICD-10-CM | POA: Diagnosis not present

## 2018-01-04 DIAGNOSIS — J449 Chronic obstructive pulmonary disease, unspecified: Secondary | ICD-10-CM | POA: Diagnosis not present

## 2018-01-04 DIAGNOSIS — H353232 Exudative age-related macular degeneration, bilateral, with inactive choroidal neovascularization: Secondary | ICD-10-CM | POA: Diagnosis not present

## 2018-01-04 DIAGNOSIS — M1610 Unilateral primary osteoarthritis, unspecified hip: Secondary | ICD-10-CM | POA: Diagnosis not present

## 2018-01-04 DIAGNOSIS — I5030 Unspecified diastolic (congestive) heart failure: Secondary | ICD-10-CM | POA: Diagnosis not present

## 2018-01-06 DIAGNOSIS — M1610 Unilateral primary osteoarthritis, unspecified hip: Secondary | ICD-10-CM | POA: Diagnosis not present

## 2018-01-06 DIAGNOSIS — H353232 Exudative age-related macular degeneration, bilateral, with inactive choroidal neovascularization: Secondary | ICD-10-CM | POA: Diagnosis not present

## 2018-01-06 DIAGNOSIS — S32512D Fracture of superior rim of left pubis, subsequent encounter for fracture with routine healing: Secondary | ICD-10-CM | POA: Diagnosis not present

## 2018-01-06 DIAGNOSIS — J449 Chronic obstructive pulmonary disease, unspecified: Secondary | ICD-10-CM | POA: Diagnosis not present

## 2018-01-06 DIAGNOSIS — I5030 Unspecified diastolic (congestive) heart failure: Secondary | ICD-10-CM | POA: Diagnosis not present

## 2018-01-06 DIAGNOSIS — I110 Hypertensive heart disease with heart failure: Secondary | ICD-10-CM | POA: Diagnosis not present

## 2018-01-11 DIAGNOSIS — S32512D Fracture of superior rim of left pubis, subsequent encounter for fracture with routine healing: Secondary | ICD-10-CM | POA: Diagnosis not present

## 2018-01-11 DIAGNOSIS — H353232 Exudative age-related macular degeneration, bilateral, with inactive choroidal neovascularization: Secondary | ICD-10-CM | POA: Diagnosis not present

## 2018-01-11 DIAGNOSIS — I5030 Unspecified diastolic (congestive) heart failure: Secondary | ICD-10-CM | POA: Diagnosis not present

## 2018-01-11 DIAGNOSIS — M1610 Unilateral primary osteoarthritis, unspecified hip: Secondary | ICD-10-CM | POA: Diagnosis not present

## 2018-01-11 DIAGNOSIS — J449 Chronic obstructive pulmonary disease, unspecified: Secondary | ICD-10-CM | POA: Diagnosis not present

## 2018-01-11 DIAGNOSIS — I110 Hypertensive heart disease with heart failure: Secondary | ICD-10-CM | POA: Diagnosis not present

## 2018-01-12 DIAGNOSIS — M1610 Unilateral primary osteoarthritis, unspecified hip: Secondary | ICD-10-CM | POA: Diagnosis not present

## 2018-01-12 DIAGNOSIS — I110 Hypertensive heart disease with heart failure: Secondary | ICD-10-CM | POA: Diagnosis not present

## 2018-01-12 DIAGNOSIS — J449 Chronic obstructive pulmonary disease, unspecified: Secondary | ICD-10-CM | POA: Diagnosis not present

## 2018-01-12 DIAGNOSIS — H353232 Exudative age-related macular degeneration, bilateral, with inactive choroidal neovascularization: Secondary | ICD-10-CM | POA: Diagnosis not present

## 2018-01-12 DIAGNOSIS — I5030 Unspecified diastolic (congestive) heart failure: Secondary | ICD-10-CM | POA: Diagnosis not present

## 2018-01-12 DIAGNOSIS — S32512D Fracture of superior rim of left pubis, subsequent encounter for fracture with routine healing: Secondary | ICD-10-CM | POA: Diagnosis not present

## 2018-01-13 DIAGNOSIS — H353232 Exudative age-related macular degeneration, bilateral, with inactive choroidal neovascularization: Secondary | ICD-10-CM | POA: Diagnosis not present

## 2018-01-13 DIAGNOSIS — I110 Hypertensive heart disease with heart failure: Secondary | ICD-10-CM | POA: Diagnosis not present

## 2018-01-13 DIAGNOSIS — I5030 Unspecified diastolic (congestive) heart failure: Secondary | ICD-10-CM | POA: Diagnosis not present

## 2018-01-13 DIAGNOSIS — S32512D Fracture of superior rim of left pubis, subsequent encounter for fracture with routine healing: Secondary | ICD-10-CM | POA: Diagnosis not present

## 2018-01-13 DIAGNOSIS — M1610 Unilateral primary osteoarthritis, unspecified hip: Secondary | ICD-10-CM | POA: Diagnosis not present

## 2018-01-13 DIAGNOSIS — J449 Chronic obstructive pulmonary disease, unspecified: Secondary | ICD-10-CM | POA: Diagnosis not present

## 2018-01-14 DIAGNOSIS — H353232 Exudative age-related macular degeneration, bilateral, with inactive choroidal neovascularization: Secondary | ICD-10-CM | POA: Diagnosis not present

## 2018-01-14 DIAGNOSIS — I5030 Unspecified diastolic (congestive) heart failure: Secondary | ICD-10-CM | POA: Diagnosis not present

## 2018-01-14 DIAGNOSIS — M1610 Unilateral primary osteoarthritis, unspecified hip: Secondary | ICD-10-CM | POA: Diagnosis not present

## 2018-01-14 DIAGNOSIS — J449 Chronic obstructive pulmonary disease, unspecified: Secondary | ICD-10-CM | POA: Diagnosis not present

## 2018-01-14 DIAGNOSIS — I110 Hypertensive heart disease with heart failure: Secondary | ICD-10-CM | POA: Diagnosis not present

## 2018-01-14 DIAGNOSIS — S32512D Fracture of superior rim of left pubis, subsequent encounter for fracture with routine healing: Secondary | ICD-10-CM | POA: Diagnosis not present

## 2018-01-18 DIAGNOSIS — M1610 Unilateral primary osteoarthritis, unspecified hip: Secondary | ICD-10-CM | POA: Diagnosis not present

## 2018-01-18 DIAGNOSIS — I5030 Unspecified diastolic (congestive) heart failure: Secondary | ICD-10-CM | POA: Diagnosis not present

## 2018-01-18 DIAGNOSIS — S32512D Fracture of superior rim of left pubis, subsequent encounter for fracture with routine healing: Secondary | ICD-10-CM | POA: Diagnosis not present

## 2018-01-18 DIAGNOSIS — H353232 Exudative age-related macular degeneration, bilateral, with inactive choroidal neovascularization: Secondary | ICD-10-CM | POA: Diagnosis not present

## 2018-01-18 DIAGNOSIS — I110 Hypertensive heart disease with heart failure: Secondary | ICD-10-CM | POA: Diagnosis not present

## 2018-01-18 DIAGNOSIS — J449 Chronic obstructive pulmonary disease, unspecified: Secondary | ICD-10-CM | POA: Diagnosis not present

## 2018-01-20 DIAGNOSIS — S32512D Fracture of superior rim of left pubis, subsequent encounter for fracture with routine healing: Secondary | ICD-10-CM | POA: Diagnosis not present

## 2018-01-20 DIAGNOSIS — J449 Chronic obstructive pulmonary disease, unspecified: Secondary | ICD-10-CM | POA: Diagnosis not present

## 2018-01-20 DIAGNOSIS — I5030 Unspecified diastolic (congestive) heart failure: Secondary | ICD-10-CM | POA: Diagnosis not present

## 2018-01-20 DIAGNOSIS — H353232 Exudative age-related macular degeneration, bilateral, with inactive choroidal neovascularization: Secondary | ICD-10-CM | POA: Diagnosis not present

## 2018-01-20 DIAGNOSIS — I110 Hypertensive heart disease with heart failure: Secondary | ICD-10-CM | POA: Diagnosis not present

## 2018-01-20 DIAGNOSIS — M1610 Unilateral primary osteoarthritis, unspecified hip: Secondary | ICD-10-CM | POA: Diagnosis not present

## 2018-01-21 DIAGNOSIS — I5030 Unspecified diastolic (congestive) heart failure: Secondary | ICD-10-CM | POA: Diagnosis not present

## 2018-01-21 DIAGNOSIS — S32512D Fracture of superior rim of left pubis, subsequent encounter for fracture with routine healing: Secondary | ICD-10-CM | POA: Diagnosis not present

## 2018-01-21 DIAGNOSIS — I110 Hypertensive heart disease with heart failure: Secondary | ICD-10-CM | POA: Diagnosis not present

## 2018-01-21 DIAGNOSIS — J449 Chronic obstructive pulmonary disease, unspecified: Secondary | ICD-10-CM | POA: Diagnosis not present

## 2018-01-21 DIAGNOSIS — H353232 Exudative age-related macular degeneration, bilateral, with inactive choroidal neovascularization: Secondary | ICD-10-CM | POA: Diagnosis not present

## 2018-01-21 DIAGNOSIS — M1610 Unilateral primary osteoarthritis, unspecified hip: Secondary | ICD-10-CM | POA: Diagnosis not present

## 2018-01-24 DIAGNOSIS — I110 Hypertensive heart disease with heart failure: Secondary | ICD-10-CM | POA: Diagnosis not present

## 2018-01-24 DIAGNOSIS — J449 Chronic obstructive pulmonary disease, unspecified: Secondary | ICD-10-CM | POA: Diagnosis not present

## 2018-01-24 DIAGNOSIS — S32512D Fracture of superior rim of left pubis, subsequent encounter for fracture with routine healing: Secondary | ICD-10-CM | POA: Diagnosis not present

## 2018-01-24 DIAGNOSIS — M1610 Unilateral primary osteoarthritis, unspecified hip: Secondary | ICD-10-CM | POA: Diagnosis not present

## 2018-01-24 DIAGNOSIS — I5030 Unspecified diastolic (congestive) heart failure: Secondary | ICD-10-CM | POA: Diagnosis not present

## 2018-01-24 DIAGNOSIS — H353232 Exudative age-related macular degeneration, bilateral, with inactive choroidal neovascularization: Secondary | ICD-10-CM | POA: Diagnosis not present

## 2018-01-26 DIAGNOSIS — H353232 Exudative age-related macular degeneration, bilateral, with inactive choroidal neovascularization: Secondary | ICD-10-CM | POA: Diagnosis not present

## 2018-01-26 DIAGNOSIS — I5030 Unspecified diastolic (congestive) heart failure: Secondary | ICD-10-CM | POA: Diagnosis not present

## 2018-01-26 DIAGNOSIS — J449 Chronic obstructive pulmonary disease, unspecified: Secondary | ICD-10-CM | POA: Diagnosis not present

## 2018-01-26 DIAGNOSIS — S32512D Fracture of superior rim of left pubis, subsequent encounter for fracture with routine healing: Secondary | ICD-10-CM | POA: Diagnosis not present

## 2018-01-26 DIAGNOSIS — I110 Hypertensive heart disease with heart failure: Secondary | ICD-10-CM | POA: Diagnosis not present

## 2018-01-26 DIAGNOSIS — M1610 Unilateral primary osteoarthritis, unspecified hip: Secondary | ICD-10-CM | POA: Diagnosis not present

## 2018-01-27 DIAGNOSIS — I5030 Unspecified diastolic (congestive) heart failure: Secondary | ICD-10-CM | POA: Diagnosis not present

## 2018-01-27 DIAGNOSIS — J449 Chronic obstructive pulmonary disease, unspecified: Secondary | ICD-10-CM | POA: Diagnosis not present

## 2018-01-27 DIAGNOSIS — M1610 Unilateral primary osteoarthritis, unspecified hip: Secondary | ICD-10-CM | POA: Diagnosis not present

## 2018-01-27 DIAGNOSIS — H353232 Exudative age-related macular degeneration, bilateral, with inactive choroidal neovascularization: Secondary | ICD-10-CM | POA: Diagnosis not present

## 2018-01-27 DIAGNOSIS — S32512D Fracture of superior rim of left pubis, subsequent encounter for fracture with routine healing: Secondary | ICD-10-CM | POA: Diagnosis not present

## 2018-01-27 DIAGNOSIS — I110 Hypertensive heart disease with heart failure: Secondary | ICD-10-CM | POA: Diagnosis not present

## 2018-02-02 DIAGNOSIS — M6281 Muscle weakness (generalized): Secondary | ICD-10-CM | POA: Diagnosis not present

## 2018-02-02 DIAGNOSIS — R2681 Unsteadiness on feet: Secondary | ICD-10-CM | POA: Diagnosis not present

## 2018-02-02 DIAGNOSIS — R278 Other lack of coordination: Secondary | ICD-10-CM | POA: Diagnosis not present

## 2018-02-02 DIAGNOSIS — Z9181 History of falling: Secondary | ICD-10-CM | POA: Diagnosis not present

## 2018-02-02 DIAGNOSIS — R1313 Dysphagia, pharyngeal phase: Secondary | ICD-10-CM | POA: Diagnosis not present

## 2018-02-02 DIAGNOSIS — R262 Difficulty in walking, not elsewhere classified: Secondary | ICD-10-CM | POA: Diagnosis not present

## 2018-02-04 DIAGNOSIS — M6281 Muscle weakness (generalized): Secondary | ICD-10-CM | POA: Diagnosis not present

## 2018-02-04 DIAGNOSIS — R278 Other lack of coordination: Secondary | ICD-10-CM | POA: Diagnosis not present

## 2018-02-04 DIAGNOSIS — R1313 Dysphagia, pharyngeal phase: Secondary | ICD-10-CM | POA: Diagnosis not present

## 2018-02-04 DIAGNOSIS — R2681 Unsteadiness on feet: Secondary | ICD-10-CM | POA: Diagnosis not present

## 2018-02-04 DIAGNOSIS — Z9181 History of falling: Secondary | ICD-10-CM | POA: Diagnosis not present

## 2018-02-04 DIAGNOSIS — R262 Difficulty in walking, not elsewhere classified: Secondary | ICD-10-CM | POA: Diagnosis not present

## 2018-02-07 DIAGNOSIS — B009 Herpesviral infection, unspecified: Secondary | ICD-10-CM | POA: Diagnosis not present

## 2018-02-07 DIAGNOSIS — Z9181 History of falling: Secondary | ICD-10-CM | POA: Diagnosis not present

## 2018-02-07 DIAGNOSIS — R262 Difficulty in walking, not elsewhere classified: Secondary | ICD-10-CM | POA: Diagnosis not present

## 2018-02-07 DIAGNOSIS — R278 Other lack of coordination: Secondary | ICD-10-CM | POA: Diagnosis not present

## 2018-02-07 DIAGNOSIS — M6281 Muscle weakness (generalized): Secondary | ICD-10-CM | POA: Diagnosis not present

## 2018-02-07 DIAGNOSIS — J449 Chronic obstructive pulmonary disease, unspecified: Secondary | ICD-10-CM | POA: Diagnosis not present

## 2018-02-07 DIAGNOSIS — E039 Hypothyroidism, unspecified: Secondary | ICD-10-CM | POA: Diagnosis not present

## 2018-02-07 DIAGNOSIS — F0280 Dementia in other diseases classified elsewhere without behavioral disturbance: Secondary | ICD-10-CM | POA: Diagnosis not present

## 2018-02-07 DIAGNOSIS — R2681 Unsteadiness on feet: Secondary | ICD-10-CM | POA: Diagnosis not present

## 2018-02-07 DIAGNOSIS — R1313 Dysphagia, pharyngeal phase: Secondary | ICD-10-CM | POA: Diagnosis not present

## 2018-02-07 DIAGNOSIS — Z79899 Other long term (current) drug therapy: Secondary | ICD-10-CM | POA: Diagnosis not present

## 2018-02-08 DIAGNOSIS — R1313 Dysphagia, pharyngeal phase: Secondary | ICD-10-CM | POA: Diagnosis not present

## 2018-02-08 DIAGNOSIS — Z9181 History of falling: Secondary | ICD-10-CM | POA: Diagnosis not present

## 2018-02-08 DIAGNOSIS — R2681 Unsteadiness on feet: Secondary | ICD-10-CM | POA: Diagnosis not present

## 2018-02-08 DIAGNOSIS — M6281 Muscle weakness (generalized): Secondary | ICD-10-CM | POA: Diagnosis not present

## 2018-02-08 DIAGNOSIS — R262 Difficulty in walking, not elsewhere classified: Secondary | ICD-10-CM | POA: Diagnosis not present

## 2018-02-08 DIAGNOSIS — R278 Other lack of coordination: Secondary | ICD-10-CM | POA: Diagnosis not present

## 2018-02-09 DIAGNOSIS — R1313 Dysphagia, pharyngeal phase: Secondary | ICD-10-CM | POA: Diagnosis not present

## 2018-02-09 DIAGNOSIS — R278 Other lack of coordination: Secondary | ICD-10-CM | POA: Diagnosis not present

## 2018-02-09 DIAGNOSIS — R262 Difficulty in walking, not elsewhere classified: Secondary | ICD-10-CM | POA: Diagnosis not present

## 2018-02-09 DIAGNOSIS — R2681 Unsteadiness on feet: Secondary | ICD-10-CM | POA: Diagnosis not present

## 2018-02-09 DIAGNOSIS — Z9181 History of falling: Secondary | ICD-10-CM | POA: Diagnosis not present

## 2018-02-09 DIAGNOSIS — M6281 Muscle weakness (generalized): Secondary | ICD-10-CM | POA: Diagnosis not present

## 2018-02-10 DIAGNOSIS — R1313 Dysphagia, pharyngeal phase: Secondary | ICD-10-CM | POA: Diagnosis not present

## 2018-02-10 DIAGNOSIS — Z9181 History of falling: Secondary | ICD-10-CM | POA: Diagnosis not present

## 2018-02-10 DIAGNOSIS — R2681 Unsteadiness on feet: Secondary | ICD-10-CM | POA: Diagnosis not present

## 2018-02-10 DIAGNOSIS — M6281 Muscle weakness (generalized): Secondary | ICD-10-CM | POA: Diagnosis not present

## 2018-02-10 DIAGNOSIS — R278 Other lack of coordination: Secondary | ICD-10-CM | POA: Diagnosis not present

## 2018-02-10 DIAGNOSIS — R262 Difficulty in walking, not elsewhere classified: Secondary | ICD-10-CM | POA: Diagnosis not present

## 2018-02-11 DIAGNOSIS — R1313 Dysphagia, pharyngeal phase: Secondary | ICD-10-CM | POA: Diagnosis not present

## 2018-02-11 DIAGNOSIS — M6281 Muscle weakness (generalized): Secondary | ICD-10-CM | POA: Diagnosis not present

## 2018-02-11 DIAGNOSIS — Z9181 History of falling: Secondary | ICD-10-CM | POA: Diagnosis not present

## 2018-02-11 DIAGNOSIS — R2681 Unsteadiness on feet: Secondary | ICD-10-CM | POA: Diagnosis not present

## 2018-02-11 DIAGNOSIS — R262 Difficulty in walking, not elsewhere classified: Secondary | ICD-10-CM | POA: Diagnosis not present

## 2018-02-11 DIAGNOSIS — R278 Other lack of coordination: Secondary | ICD-10-CM | POA: Diagnosis not present

## 2018-02-14 DIAGNOSIS — Z9181 History of falling: Secondary | ICD-10-CM | POA: Diagnosis not present

## 2018-02-14 DIAGNOSIS — R2681 Unsteadiness on feet: Secondary | ICD-10-CM | POA: Diagnosis not present

## 2018-02-14 DIAGNOSIS — R1313 Dysphagia, pharyngeal phase: Secondary | ICD-10-CM | POA: Diagnosis not present

## 2018-02-14 DIAGNOSIS — R278 Other lack of coordination: Secondary | ICD-10-CM | POA: Diagnosis not present

## 2018-02-14 DIAGNOSIS — M6281 Muscle weakness (generalized): Secondary | ICD-10-CM | POA: Diagnosis not present

## 2018-02-14 DIAGNOSIS — R262 Difficulty in walking, not elsewhere classified: Secondary | ICD-10-CM | POA: Diagnosis not present

## 2018-02-15 DIAGNOSIS — R262 Difficulty in walking, not elsewhere classified: Secondary | ICD-10-CM | POA: Diagnosis not present

## 2018-02-15 DIAGNOSIS — R2681 Unsteadiness on feet: Secondary | ICD-10-CM | POA: Diagnosis not present

## 2018-02-15 DIAGNOSIS — Z9181 History of falling: Secondary | ICD-10-CM | POA: Diagnosis not present

## 2018-02-15 DIAGNOSIS — R1313 Dysphagia, pharyngeal phase: Secondary | ICD-10-CM | POA: Diagnosis not present

## 2018-02-15 DIAGNOSIS — M6281 Muscle weakness (generalized): Secondary | ICD-10-CM | POA: Diagnosis not present

## 2018-02-15 DIAGNOSIS — R278 Other lack of coordination: Secondary | ICD-10-CM | POA: Diagnosis not present

## 2018-02-16 DIAGNOSIS — M6281 Muscle weakness (generalized): Secondary | ICD-10-CM | POA: Diagnosis not present

## 2018-02-16 DIAGNOSIS — R2681 Unsteadiness on feet: Secondary | ICD-10-CM | POA: Diagnosis not present

## 2018-02-16 DIAGNOSIS — R278 Other lack of coordination: Secondary | ICD-10-CM | POA: Diagnosis not present

## 2018-02-16 DIAGNOSIS — R1313 Dysphagia, pharyngeal phase: Secondary | ICD-10-CM | POA: Diagnosis not present

## 2018-02-16 DIAGNOSIS — Z9181 History of falling: Secondary | ICD-10-CM | POA: Diagnosis not present

## 2018-02-16 DIAGNOSIS — R262 Difficulty in walking, not elsewhere classified: Secondary | ICD-10-CM | POA: Diagnosis not present

## 2018-02-17 DIAGNOSIS — R1313 Dysphagia, pharyngeal phase: Secondary | ICD-10-CM | POA: Diagnosis not present

## 2018-02-17 DIAGNOSIS — R2681 Unsteadiness on feet: Secondary | ICD-10-CM | POA: Diagnosis not present

## 2018-02-17 DIAGNOSIS — R262 Difficulty in walking, not elsewhere classified: Secondary | ICD-10-CM | POA: Diagnosis not present

## 2018-02-17 DIAGNOSIS — R278 Other lack of coordination: Secondary | ICD-10-CM | POA: Diagnosis not present

## 2018-02-17 DIAGNOSIS — M6281 Muscle weakness (generalized): Secondary | ICD-10-CM | POA: Diagnosis not present

## 2018-02-17 DIAGNOSIS — Z9181 History of falling: Secondary | ICD-10-CM | POA: Diagnosis not present

## 2018-02-21 DIAGNOSIS — R262 Difficulty in walking, not elsewhere classified: Secondary | ICD-10-CM | POA: Diagnosis not present

## 2018-02-21 DIAGNOSIS — R2681 Unsteadiness on feet: Secondary | ICD-10-CM | POA: Diagnosis not present

## 2018-02-21 DIAGNOSIS — R1313 Dysphagia, pharyngeal phase: Secondary | ICD-10-CM | POA: Diagnosis not present

## 2018-02-21 DIAGNOSIS — M6281 Muscle weakness (generalized): Secondary | ICD-10-CM | POA: Diagnosis not present

## 2018-02-21 DIAGNOSIS — Z9181 History of falling: Secondary | ICD-10-CM | POA: Diagnosis not present

## 2018-02-21 DIAGNOSIS — R278 Other lack of coordination: Secondary | ICD-10-CM | POA: Diagnosis not present

## 2018-02-22 DIAGNOSIS — R1313 Dysphagia, pharyngeal phase: Secondary | ICD-10-CM | POA: Diagnosis not present

## 2018-02-22 DIAGNOSIS — R2681 Unsteadiness on feet: Secondary | ICD-10-CM | POA: Diagnosis not present

## 2018-02-22 DIAGNOSIS — Z9181 History of falling: Secondary | ICD-10-CM | POA: Diagnosis not present

## 2018-02-22 DIAGNOSIS — M6281 Muscle weakness (generalized): Secondary | ICD-10-CM | POA: Diagnosis not present

## 2018-02-22 DIAGNOSIS — R278 Other lack of coordination: Secondary | ICD-10-CM | POA: Diagnosis not present

## 2018-02-22 DIAGNOSIS — R262 Difficulty in walking, not elsewhere classified: Secondary | ICD-10-CM | POA: Diagnosis not present

## 2018-02-23 DIAGNOSIS — R278 Other lack of coordination: Secondary | ICD-10-CM | POA: Diagnosis not present

## 2018-02-23 DIAGNOSIS — R262 Difficulty in walking, not elsewhere classified: Secondary | ICD-10-CM | POA: Diagnosis not present

## 2018-02-23 DIAGNOSIS — R1313 Dysphagia, pharyngeal phase: Secondary | ICD-10-CM | POA: Diagnosis not present

## 2018-02-23 DIAGNOSIS — R2681 Unsteadiness on feet: Secondary | ICD-10-CM | POA: Diagnosis not present

## 2018-02-23 DIAGNOSIS — Z9181 History of falling: Secondary | ICD-10-CM | POA: Diagnosis not present

## 2018-02-23 DIAGNOSIS — M6281 Muscle weakness (generalized): Secondary | ICD-10-CM | POA: Diagnosis not present

## 2018-02-25 DIAGNOSIS — R278 Other lack of coordination: Secondary | ICD-10-CM | POA: Diagnosis not present

## 2018-02-25 DIAGNOSIS — M6281 Muscle weakness (generalized): Secondary | ICD-10-CM | POA: Diagnosis not present

## 2018-02-25 DIAGNOSIS — R262 Difficulty in walking, not elsewhere classified: Secondary | ICD-10-CM | POA: Diagnosis not present

## 2018-02-25 DIAGNOSIS — Z9181 History of falling: Secondary | ICD-10-CM | POA: Diagnosis not present

## 2018-02-25 DIAGNOSIS — R2681 Unsteadiness on feet: Secondary | ICD-10-CM | POA: Diagnosis not present

## 2018-02-25 DIAGNOSIS — R1313 Dysphagia, pharyngeal phase: Secondary | ICD-10-CM | POA: Diagnosis not present

## 2018-03-01 DIAGNOSIS — R262 Difficulty in walking, not elsewhere classified: Secondary | ICD-10-CM | POA: Diagnosis not present

## 2018-03-01 DIAGNOSIS — Z9181 History of falling: Secondary | ICD-10-CM | POA: Diagnosis not present

## 2018-03-01 DIAGNOSIS — M6281 Muscle weakness (generalized): Secondary | ICD-10-CM | POA: Diagnosis not present

## 2018-03-01 DIAGNOSIS — R278 Other lack of coordination: Secondary | ICD-10-CM | POA: Diagnosis not present

## 2018-03-04 DIAGNOSIS — R262 Difficulty in walking, not elsewhere classified: Secondary | ICD-10-CM | POA: Diagnosis not present

## 2018-03-04 DIAGNOSIS — M6281 Muscle weakness (generalized): Secondary | ICD-10-CM | POA: Diagnosis not present

## 2018-03-04 DIAGNOSIS — R278 Other lack of coordination: Secondary | ICD-10-CM | POA: Diagnosis not present

## 2018-03-04 DIAGNOSIS — Z9181 History of falling: Secondary | ICD-10-CM | POA: Diagnosis not present

## 2018-03-08 DIAGNOSIS — M6281 Muscle weakness (generalized): Secondary | ICD-10-CM | POA: Diagnosis not present

## 2018-03-08 DIAGNOSIS — Z9181 History of falling: Secondary | ICD-10-CM | POA: Diagnosis not present

## 2018-03-08 DIAGNOSIS — R278 Other lack of coordination: Secondary | ICD-10-CM | POA: Diagnosis not present

## 2018-03-08 DIAGNOSIS — R262 Difficulty in walking, not elsewhere classified: Secondary | ICD-10-CM | POA: Diagnosis not present

## 2018-03-09 DIAGNOSIS — R278 Other lack of coordination: Secondary | ICD-10-CM | POA: Diagnosis not present

## 2018-03-09 DIAGNOSIS — R262 Difficulty in walking, not elsewhere classified: Secondary | ICD-10-CM | POA: Diagnosis not present

## 2018-03-09 DIAGNOSIS — Z9181 History of falling: Secondary | ICD-10-CM | POA: Diagnosis not present

## 2018-03-09 DIAGNOSIS — M6281 Muscle weakness (generalized): Secondary | ICD-10-CM | POA: Diagnosis not present

## 2018-03-15 DIAGNOSIS — R278 Other lack of coordination: Secondary | ICD-10-CM | POA: Diagnosis not present

## 2018-03-15 DIAGNOSIS — Z9181 History of falling: Secondary | ICD-10-CM | POA: Diagnosis not present

## 2018-03-15 DIAGNOSIS — M6281 Muscle weakness (generalized): Secondary | ICD-10-CM | POA: Diagnosis not present

## 2018-03-15 DIAGNOSIS — R262 Difficulty in walking, not elsewhere classified: Secondary | ICD-10-CM | POA: Diagnosis not present

## 2018-03-17 DIAGNOSIS — R278 Other lack of coordination: Secondary | ICD-10-CM | POA: Diagnosis not present

## 2018-03-17 DIAGNOSIS — Z9181 History of falling: Secondary | ICD-10-CM | POA: Diagnosis not present

## 2018-03-17 DIAGNOSIS — M6281 Muscle weakness (generalized): Secondary | ICD-10-CM | POA: Diagnosis not present

## 2018-03-17 DIAGNOSIS — R262 Difficulty in walking, not elsewhere classified: Secondary | ICD-10-CM | POA: Diagnosis not present

## 2018-03-22 DIAGNOSIS — M6281 Muscle weakness (generalized): Secondary | ICD-10-CM | POA: Diagnosis not present

## 2018-03-22 DIAGNOSIS — Z9181 History of falling: Secondary | ICD-10-CM | POA: Diagnosis not present

## 2018-03-22 DIAGNOSIS — R278 Other lack of coordination: Secondary | ICD-10-CM | POA: Diagnosis not present

## 2018-03-22 DIAGNOSIS — R262 Difficulty in walking, not elsewhere classified: Secondary | ICD-10-CM | POA: Diagnosis not present

## 2018-03-23 DIAGNOSIS — R262 Difficulty in walking, not elsewhere classified: Secondary | ICD-10-CM | POA: Diagnosis not present

## 2018-03-23 DIAGNOSIS — M6281 Muscle weakness (generalized): Secondary | ICD-10-CM | POA: Diagnosis not present

## 2018-03-23 DIAGNOSIS — Z9181 History of falling: Secondary | ICD-10-CM | POA: Diagnosis not present

## 2018-03-23 DIAGNOSIS — R278 Other lack of coordination: Secondary | ICD-10-CM | POA: Diagnosis not present

## 2018-03-28 DIAGNOSIS — F0280 Dementia in other diseases classified elsewhere without behavioral disturbance: Secondary | ICD-10-CM | POA: Diagnosis not present

## 2018-03-28 DIAGNOSIS — S60222A Contusion of left hand, initial encounter: Secondary | ICD-10-CM | POA: Diagnosis not present

## 2018-03-28 DIAGNOSIS — E039 Hypothyroidism, unspecified: Secondary | ICD-10-CM | POA: Diagnosis not present

## 2018-03-29 DIAGNOSIS — R262 Difficulty in walking, not elsewhere classified: Secondary | ICD-10-CM | POA: Diagnosis not present

## 2018-03-29 DIAGNOSIS — M6281 Muscle weakness (generalized): Secondary | ICD-10-CM | POA: Diagnosis not present

## 2018-03-29 DIAGNOSIS — R278 Other lack of coordination: Secondary | ICD-10-CM | POA: Diagnosis not present

## 2018-03-29 DIAGNOSIS — Z9181 History of falling: Secondary | ICD-10-CM | POA: Diagnosis not present

## 2018-03-31 DIAGNOSIS — Z9181 History of falling: Secondary | ICD-10-CM | POA: Diagnosis not present

## 2018-03-31 DIAGNOSIS — R278 Other lack of coordination: Secondary | ICD-10-CM | POA: Diagnosis not present

## 2018-03-31 DIAGNOSIS — R262 Difficulty in walking, not elsewhere classified: Secondary | ICD-10-CM | POA: Diagnosis not present

## 2018-03-31 DIAGNOSIS — M6281 Muscle weakness (generalized): Secondary | ICD-10-CM | POA: Diagnosis not present

## 2018-04-01 DIAGNOSIS — Z9181 History of falling: Secondary | ICD-10-CM | POA: Diagnosis not present

## 2018-04-01 DIAGNOSIS — R278 Other lack of coordination: Secondary | ICD-10-CM | POA: Diagnosis not present

## 2018-04-01 DIAGNOSIS — M6281 Muscle weakness (generalized): Secondary | ICD-10-CM | POA: Diagnosis not present

## 2018-04-01 DIAGNOSIS — R262 Difficulty in walking, not elsewhere classified: Secondary | ICD-10-CM | POA: Diagnosis not present

## 2018-04-05 DIAGNOSIS — R262 Difficulty in walking, not elsewhere classified: Secondary | ICD-10-CM | POA: Diagnosis not present

## 2018-04-05 DIAGNOSIS — R278 Other lack of coordination: Secondary | ICD-10-CM | POA: Diagnosis not present

## 2018-04-05 DIAGNOSIS — M6281 Muscle weakness (generalized): Secondary | ICD-10-CM | POA: Diagnosis not present

## 2018-04-05 DIAGNOSIS — Z9181 History of falling: Secondary | ICD-10-CM | POA: Diagnosis not present

## 2018-04-07 DIAGNOSIS — R262 Difficulty in walking, not elsewhere classified: Secondary | ICD-10-CM | POA: Diagnosis not present

## 2018-04-07 DIAGNOSIS — R278 Other lack of coordination: Secondary | ICD-10-CM | POA: Diagnosis not present

## 2018-04-07 DIAGNOSIS — Z9181 History of falling: Secondary | ICD-10-CM | POA: Diagnosis not present

## 2018-04-07 DIAGNOSIS — M6281 Muscle weakness (generalized): Secondary | ICD-10-CM | POA: Diagnosis not present

## 2018-04-11 DIAGNOSIS — Z9181 History of falling: Secondary | ICD-10-CM | POA: Diagnosis not present

## 2018-04-11 DIAGNOSIS — F0281 Dementia in other diseases classified elsewhere with behavioral disturbance: Secondary | ICD-10-CM | POA: Diagnosis not present

## 2018-04-11 DIAGNOSIS — E039 Hypothyroidism, unspecified: Secondary | ICD-10-CM | POA: Diagnosis not present

## 2018-04-11 DIAGNOSIS — M6281 Muscle weakness (generalized): Secondary | ICD-10-CM | POA: Diagnosis not present

## 2018-04-11 DIAGNOSIS — I2510 Atherosclerotic heart disease of native coronary artery without angina pectoris: Secondary | ICD-10-CM | POA: Diagnosis not present

## 2018-04-11 DIAGNOSIS — R278 Other lack of coordination: Secondary | ICD-10-CM | POA: Diagnosis not present

## 2018-04-11 DIAGNOSIS — R262 Difficulty in walking, not elsewhere classified: Secondary | ICD-10-CM | POA: Diagnosis not present

## 2018-04-11 DIAGNOSIS — Z79899 Other long term (current) drug therapy: Secondary | ICD-10-CM | POA: Diagnosis not present

## 2018-04-11 DIAGNOSIS — K5909 Other constipation: Secondary | ICD-10-CM | POA: Diagnosis not present

## 2018-04-13 DIAGNOSIS — M6281 Muscle weakness (generalized): Secondary | ICD-10-CM | POA: Diagnosis not present

## 2018-04-13 DIAGNOSIS — R262 Difficulty in walking, not elsewhere classified: Secondary | ICD-10-CM | POA: Diagnosis not present

## 2018-04-13 DIAGNOSIS — R278 Other lack of coordination: Secondary | ICD-10-CM | POA: Diagnosis not present

## 2018-04-13 DIAGNOSIS — Z9181 History of falling: Secondary | ICD-10-CM | POA: Diagnosis not present

## 2018-04-19 DIAGNOSIS — Z9181 History of falling: Secondary | ICD-10-CM | POA: Diagnosis not present

## 2018-04-19 DIAGNOSIS — R262 Difficulty in walking, not elsewhere classified: Secondary | ICD-10-CM | POA: Diagnosis not present

## 2018-04-19 DIAGNOSIS — M6281 Muscle weakness (generalized): Secondary | ICD-10-CM | POA: Diagnosis not present

## 2018-04-19 DIAGNOSIS — R278 Other lack of coordination: Secondary | ICD-10-CM | POA: Diagnosis not present

## 2018-04-20 DIAGNOSIS — R262 Difficulty in walking, not elsewhere classified: Secondary | ICD-10-CM | POA: Diagnosis not present

## 2018-04-20 DIAGNOSIS — Z9181 History of falling: Secondary | ICD-10-CM | POA: Diagnosis not present

## 2018-04-20 DIAGNOSIS — M6281 Muscle weakness (generalized): Secondary | ICD-10-CM | POA: Diagnosis not present

## 2018-04-20 DIAGNOSIS — R278 Other lack of coordination: Secondary | ICD-10-CM | POA: Diagnosis not present

## 2018-04-21 DIAGNOSIS — Z79899 Other long term (current) drug therapy: Secondary | ICD-10-CM | POA: Diagnosis not present

## 2018-04-25 DIAGNOSIS — R262 Difficulty in walking, not elsewhere classified: Secondary | ICD-10-CM | POA: Diagnosis not present

## 2018-04-25 DIAGNOSIS — Z9181 History of falling: Secondary | ICD-10-CM | POA: Diagnosis not present

## 2018-04-25 DIAGNOSIS — R278 Other lack of coordination: Secondary | ICD-10-CM | POA: Diagnosis not present

## 2018-04-25 DIAGNOSIS — M6281 Muscle weakness (generalized): Secondary | ICD-10-CM | POA: Diagnosis not present

## 2018-04-26 DIAGNOSIS — M6281 Muscle weakness (generalized): Secondary | ICD-10-CM | POA: Diagnosis not present

## 2018-04-26 DIAGNOSIS — R262 Difficulty in walking, not elsewhere classified: Secondary | ICD-10-CM | POA: Diagnosis not present

## 2018-04-26 DIAGNOSIS — Z9181 History of falling: Secondary | ICD-10-CM | POA: Diagnosis not present

## 2018-04-26 DIAGNOSIS — R278 Other lack of coordination: Secondary | ICD-10-CM | POA: Diagnosis not present

## 2018-06-02 ENCOUNTER — Encounter (HOSPITAL_COMMUNITY): Payer: Self-pay | Admitting: Obstetrics and Gynecology

## 2018-06-02 ENCOUNTER — Inpatient Hospital Stay (HOSPITAL_COMMUNITY)
Admission: EM | Admit: 2018-06-02 | Discharge: 2018-06-07 | DRG: 690 | Disposition: A | Discharge status: Home Health | Payer: Medicare Other | Attending: Internal Medicine | Admitting: Internal Medicine

## 2018-06-02 ENCOUNTER — Other Ambulatory Visit: Payer: Self-pay

## 2018-06-02 ENCOUNTER — Emergency Department (HOSPITAL_COMMUNITY): Payer: Medicare Other

## 2018-06-02 DIAGNOSIS — I10 Essential (primary) hypertension: Secondary | ICD-10-CM | POA: Diagnosis present

## 2018-06-02 DIAGNOSIS — R1111 Vomiting without nausea: Secondary | ICD-10-CM | POA: Diagnosis not present

## 2018-06-02 DIAGNOSIS — B9620 Unspecified Escherichia coli [E. coli] as the cause of diseases classified elsewhere: Secondary | ICD-10-CM | POA: Diagnosis present

## 2018-06-02 DIAGNOSIS — N390 Urinary tract infection, site not specified: Secondary | ICD-10-CM | POA: Diagnosis not present

## 2018-06-02 DIAGNOSIS — N12 Tubulo-interstitial nephritis, not specified as acute or chronic: Secondary | ICD-10-CM | POA: Diagnosis present

## 2018-06-02 DIAGNOSIS — Z79890 Hormone replacement therapy (postmenopausal): Secondary | ICD-10-CM

## 2018-06-02 DIAGNOSIS — R112 Nausea with vomiting, unspecified: Secondary | ICD-10-CM | POA: Diagnosis not present

## 2018-06-02 DIAGNOSIS — E872 Acidosis: Secondary | ICD-10-CM | POA: Diagnosis not present

## 2018-06-02 DIAGNOSIS — Z9049 Acquired absence of other specified parts of digestive tract: Secondary | ICD-10-CM

## 2018-06-02 DIAGNOSIS — I2510 Atherosclerotic heart disease of native coronary artery without angina pectoris: Secondary | ICD-10-CM | POA: Diagnosis present

## 2018-06-02 DIAGNOSIS — Z8379 Family history of other diseases of the digestive system: Secondary | ICD-10-CM

## 2018-06-02 DIAGNOSIS — Z85820 Personal history of malignant melanoma of skin: Secondary | ICD-10-CM

## 2018-06-02 DIAGNOSIS — Z66 Do not resuscitate: Secondary | ICD-10-CM | POA: Diagnosis present

## 2018-06-02 DIAGNOSIS — F0391 Unspecified dementia with behavioral disturbance: Secondary | ICD-10-CM | POA: Diagnosis not present

## 2018-06-02 DIAGNOSIS — Z9089 Acquired absence of other organs: Secondary | ICD-10-CM

## 2018-06-02 DIAGNOSIS — E7849 Other hyperlipidemia: Secondary | ICD-10-CM | POA: Diagnosis not present

## 2018-06-02 DIAGNOSIS — E785 Hyperlipidemia, unspecified: Secondary | ICD-10-CM | POA: Diagnosis present

## 2018-06-02 DIAGNOSIS — F0390 Unspecified dementia without behavioral disturbance: Secondary | ICD-10-CM | POA: Diagnosis present

## 2018-06-02 DIAGNOSIS — E870 Hyperosmolality and hypernatremia: Secondary | ICD-10-CM | POA: Diagnosis not present

## 2018-06-02 DIAGNOSIS — Z79891 Long term (current) use of opiate analgesic: Secondary | ICD-10-CM

## 2018-06-02 DIAGNOSIS — Z8249 Family history of ischemic heart disease and other diseases of the circulatory system: Secondary | ICD-10-CM

## 2018-06-02 DIAGNOSIS — Z79899 Other long term (current) drug therapy: Secondary | ICD-10-CM

## 2018-06-02 DIAGNOSIS — Z7901 Long term (current) use of anticoagulants: Secondary | ICD-10-CM

## 2018-06-02 DIAGNOSIS — I3139 Other pericardial effusion (noninflammatory): Secondary | ICD-10-CM

## 2018-06-02 DIAGNOSIS — E875 Hyperkalemia: Secondary | ICD-10-CM | POA: Diagnosis present

## 2018-06-02 DIAGNOSIS — Z886 Allergy status to analgesic agent status: Secondary | ICD-10-CM

## 2018-06-02 DIAGNOSIS — R0989 Other specified symptoms and signs involving the circulatory and respiratory systems: Secondary | ICD-10-CM | POA: Diagnosis not present

## 2018-06-02 DIAGNOSIS — J449 Chronic obstructive pulmonary disease, unspecified: Secondary | ICD-10-CM | POA: Diagnosis present

## 2018-06-02 DIAGNOSIS — F0151 Vascular dementia with behavioral disturbance: Secondary | ICD-10-CM

## 2018-06-02 DIAGNOSIS — Z8781 Personal history of (healed) traumatic fracture: Secondary | ICD-10-CM

## 2018-06-02 DIAGNOSIS — I082 Rheumatic disorders of both aortic and tricuspid valves: Secondary | ICD-10-CM | POA: Diagnosis present

## 2018-06-02 DIAGNOSIS — R0902 Hypoxemia: Secondary | ICD-10-CM | POA: Diagnosis not present

## 2018-06-02 DIAGNOSIS — E039 Hypothyroidism, unspecified: Secondary | ICD-10-CM | POA: Diagnosis present

## 2018-06-02 DIAGNOSIS — Z803 Family history of malignant neoplasm of breast: Secondary | ICD-10-CM

## 2018-06-02 DIAGNOSIS — E860 Dehydration: Secondary | ICD-10-CM | POA: Diagnosis not present

## 2018-06-02 DIAGNOSIS — M810 Age-related osteoporosis without current pathological fracture: Secondary | ICD-10-CM | POA: Diagnosis present

## 2018-06-02 DIAGNOSIS — F05 Delirium due to known physiological condition: Secondary | ICD-10-CM | POA: Diagnosis present

## 2018-06-02 DIAGNOSIS — Z993 Dependence on wheelchair: Secondary | ICD-10-CM

## 2018-06-02 DIAGNOSIS — I313 Pericardial effusion (noninflammatory): Secondary | ICD-10-CM | POA: Diagnosis not present

## 2018-06-02 DIAGNOSIS — I110 Hypertensive heart disease with heart failure: Secondary | ICD-10-CM | POA: Diagnosis present

## 2018-06-02 DIAGNOSIS — Z9181 History of falling: Secondary | ICD-10-CM

## 2018-06-02 DIAGNOSIS — I5032 Chronic diastolic (congestive) heart failure: Secondary | ICD-10-CM | POA: Diagnosis present

## 2018-06-02 DIAGNOSIS — A419 Sepsis, unspecified organism: Secondary | ICD-10-CM | POA: Diagnosis present

## 2018-06-02 DIAGNOSIS — F01518 Vascular dementia, unspecified severity, with other behavioral disturbance: Secondary | ICD-10-CM

## 2018-06-02 HISTORY — DX: Unspecified dementia, unspecified severity, without behavioral disturbance, psychotic disturbance, mood disturbance, and anxiety: F03.90

## 2018-06-02 LAB — CBC
HEMATOCRIT: 47.1 % — AB (ref 36.0–46.0)
HEMOGLOBIN: 15.2 g/dL — AB (ref 12.0–15.0)
MCH: 31.3 pg (ref 26.0–34.0)
MCHC: 32.3 g/dL (ref 30.0–36.0)
MCV: 96.9 fL (ref 80.0–100.0)
Platelets: 265 10*3/uL (ref 150–400)
RBC: 4.86 MIL/uL (ref 3.87–5.11)
RDW: 17.7 % — ABNORMAL HIGH (ref 11.5–15.5)
WBC: 18.1 10*3/uL — ABNORMAL HIGH (ref 4.0–10.5)
nRBC: 0 % (ref 0.0–0.2)

## 2018-06-02 LAB — COMPREHENSIVE METABOLIC PANEL
ALBUMIN: 3.9 g/dL (ref 3.5–5.0)
ALT: 16 U/L (ref 0–44)
AST: 38 U/L (ref 15–41)
Alkaline Phosphatase: 49 U/L (ref 38–126)
Anion gap: 12 (ref 5–15)
BUN: 19 mg/dL (ref 8–23)
CHLORIDE: 103 mmol/L (ref 98–111)
CO2: 24 mmol/L (ref 22–32)
CREATININE: 0.94 mg/dL (ref 0.44–1.00)
Calcium: 8.8 mg/dL — ABNORMAL LOW (ref 8.9–10.3)
GFR calc Af Amer: >60 mL/min (ref >60)
GFR calc non Af Amer: 52 mL/min — ABNORMAL LOW (ref >60)
GLUCOSE: 145 mg/dL — AB (ref 70–99)
POTASSIUM: 5.5 mmol/L — AB (ref 3.5–5.1)
Sodium: 139 mmol/L (ref 135–145)
Total Bilirubin: 1.6 mg/dL — ABNORMAL HIGH (ref 0.3–1.2)
Total Protein: 6.5 g/dL (ref 6.5–8.1)

## 2018-06-02 LAB — URINALYSIS, ROUTINE W REFLEX MICROSCOPIC
Bilirubin Urine: NEGATIVE
Glucose, UA: NEGATIVE mg/dL
Ketones, ur: NEGATIVE mg/dL
Nitrite: POSITIVE — AB
Protein, ur: 30 mg/dL — AB
SPECIFIC GRAVITY, URINE: 1.014 (ref 1.005–1.030)
pH: 6 (ref 5.0–8.0)

## 2018-06-02 LAB — LIPASE, BLOOD: LIPASE: 33 U/L (ref 11–51)

## 2018-06-02 MED ORDER — SODIUM CHLORIDE 0.9 % IV SOLN
INTRAVENOUS | Status: AC
  Administered 2018-06-03: via INTRAVENOUS

## 2018-06-02 MED ORDER — SODIUM CHLORIDE 0.9 % IV SOLN
1.0000 g | Freq: Once | INTRAVENOUS | Status: AC
  Administered 2018-06-02: 1 g via INTRAVENOUS
  Filled 2018-06-02: qty 10

## 2018-06-02 MED ORDER — THYROID 60 MG PO TABS
60.0000 mg | ORAL_TABLET | Freq: Every day | ORAL | Status: DC
  Administered 2018-06-03 – 2018-06-07 (×4): 60 mg via ORAL
  Filled 2018-06-02 (×5): qty 1

## 2018-06-02 MED ORDER — ACETAMINOPHEN 650 MG RE SUPP: 650.0000 mg | Freq: Four times a day (QID) | RECTAL | Status: DC | PRN

## 2018-06-02 MED ORDER — SODIUM CHLORIDE 0.9 % IV BOLUS
500.0000 mL | Freq: Once | INTRAVENOUS | Status: AC
  Administered 2018-06-02: 500 mL via INTRAVENOUS

## 2018-06-02 MED ORDER — SODIUM CHLORIDE 0.9 % IV BOLUS
1000.0000 mL | Freq: Once | INTRAVENOUS | Status: AC
  Administered 2018-06-02: 1000 mL via INTRAVENOUS

## 2018-06-02 MED ORDER — ONDANSETRON HCL 4 MG/2ML IJ SOLN: 4.0000 mg | Freq: Four times a day (QID) | INTRAMUSCULAR | Status: DC | PRN

## 2018-06-02 MED ORDER — CITALOPRAM HYDROBROMIDE 20 MG PO TABS
20.0000 mg | ORAL_TABLET | Freq: Every day | ORAL | Status: DC
  Administered 2018-06-03 – 2018-06-07 (×4): 20 mg via ORAL
  Filled 2018-06-02 (×5): qty 1

## 2018-06-02 MED ORDER — HYDROCODONE-ACETAMINOPHEN 5-325 MG PO TABS: 1.0000 | ORAL_TABLET | ORAL | Status: DC | PRN

## 2018-06-02 MED ORDER — ACETAMINOPHEN 325 MG PO TABS
650.0000 mg | ORAL_TABLET | Freq: Four times a day (QID) | ORAL | Status: DC | PRN
  Administered 2018-06-03: 650 mg via ORAL
  Filled 2018-06-02 (×2): qty 2

## 2018-06-02 MED ORDER — ASPIRIN 81 MG PO CHEW
81.0000 mg | CHEWABLE_TABLET | Freq: Two times a day (BID) | ORAL | Status: DC
  Administered 2018-06-03 – 2018-06-07 (×8): 81 mg via ORAL
  Filled 2018-06-02 (×9): qty 1

## 2018-06-02 MED ORDER — ONDANSETRON HCL 4 MG/2ML IJ SOLN
4.0000 mg | Freq: Once | INTRAMUSCULAR | Status: AC
  Administered 2018-06-02: 4 mg via INTRAVENOUS
  Filled 2018-06-02: qty 2

## 2018-06-02 MED ORDER — IPRATROPIUM-ALBUTEROL 0.5-2.5 (3) MG/3ML IN SOLN
3.0000 mL | Freq: Two times a day (BID) | RESPIRATORY_TRACT | Status: DC
  Administered 2018-06-03 – 2018-06-05 (×4): 3 mL via RESPIRATORY_TRACT
  Filled 2018-06-02 (×5): qty 3

## 2018-06-02 MED ORDER — SODIUM CHLORIDE 0.9 % IV SOLN
1.0000 g | Freq: Every day | INTRAVENOUS | Status: DC
  Administered 2018-06-03 – 2018-06-07 (×5): 1 g via INTRAVENOUS
  Filled 2018-06-02 (×2): qty 1
  Filled 2018-06-02: qty 10
  Filled 2018-06-02 (×2): qty 1

## 2018-06-02 MED ORDER — ONDANSETRON HCL 4 MG PO TABS: 4.0000 mg | ORAL_TABLET | Freq: Four times a day (QID) | ORAL | Status: DC | PRN

## 2018-06-02 MED ORDER — ENOXAPARIN SODIUM 30 MG/0.3ML ~~LOC~~ SOLN
30.0000 mg | Freq: Every day | SUBCUTANEOUS | Status: DC
  Administered 2018-06-03 – 2018-06-06 (×4): 30 mg via SUBCUTANEOUS
  Filled 2018-06-02 (×4): qty 0.3

## 2018-06-02 NOTE — H&P (Signed)
Alice [place] GHW:[id number] DOB: [birth year] DOA: 06/02/2018     PCP: Vernie Shanks, MD   Outpatient Specialists:   CARDS:  Dr.Hochrein    Patient arrived to ER on 06/02/18 at 1648  Patient coming from From facility [facility] [place] [facility]  Chief Complaint:  Chief Complaint  Patient presents with  . Emesis    HPI: [place] is a 85 y.o. female with medical history significant of HLD; HTN; diastolic heart failure; COPD; CAD; and dementia hypothyroidism    Presented with   Nausea and vomiting no fevers  Has been persistent and acute in onset productive of yellow emesis.  No bloody.  She has had up to 4 episodes today no associated fevers no abdominal pain no ill contacts recently or food poisoning.  No other symptoms associated with this including no cough patient has mild dementia and at times confused. She has been tired. She had 4 episodes of vomiting today.  Denies  any chest pain  At baseline able to recognize family but needs help with ADL   Regarding pertinent Chronic problems:  Admitted in July 2019 for fall resulting in pubic ramus fracture While in ER: UTI  WBC 18 Hemoglobin 15.2 (H)    Did not tolerate ginger Ale in ER Imaging unremarkable  The following Work up has been ordered so far:  Orders Placed This Encounter  Procedures  . Urine Culture  . DG ACUTE ABD 2+V ABD (SUPINE,ERECT,DECUB) + 1V CHEST  . Lipase, blood  . Comprehensive metabolic panel  . CBC  . Urinalysis, Routine w reflex microscopic  . Diet NPO time specified  . Saline Lock IV, Maintain IV access  . Offer Fluids  . Consult to hospitalist  . Inpatient consult to Social Work    Following Medications were ordered in ER: Medications  sodium chloride 0.9 % bolus 1,000 mL (0 mLs Intravenous Stopped 06/02/18 1919)  ondansetron (ZOFRAN) injection 4 mg (4 mg Intravenous Given 06/02/18 1728)  sodium chloride 0.9 % bolus 500 mL (0 mLs Intravenous Stopped 06/02/18 2020)  sodium  chloride 0.9 % bolus 500 mL (500 mLs Intravenous New Bag/Given 06/02/18 2034)  ondansetron (ZOFRAN) injection 4 mg (4 mg Intravenous Given 06/02/18 2030)  cefTRIAXone (ROCEPHIN) 1 g in sodium chloride 0.9 % 100 mL IVPB (1 g Intravenous New Bag/Given 06/02/18 2031)    Significant initial  Findings: Abnormal Labs Reviewed  COMPREHENSIVE METABOLIC PANEL - Abnormal; Notable for the following components:      Result Value   Potassium 5.5 (*)    Glucose, Bld 145 (*)    Calcium 8.8 (*)    Total Bilirubin 1.6 (*)    GFR calc non Af Amer 52 (*)    All other components within normal limits  CBC - Abnormal; Notable for the following components:   WBC 18.1 (*)    Hemoglobin 15.2 (*)    HCT 47.1 (*)    RDW 17.7 (*)    All other components within normal limits  URINALYSIS, ROUTINE W REFLEX MICROSCOPIC - Abnormal; Notable for the following components:   APPearance HAZY (*)    Hgb urine dipstick SMALL (*)    Protein, ur 30 (*)    Nitrite POSITIVE (*)    Leukocytes, UA LARGE (*)    Bacteria, UA RARE (*)    All other components within normal limits    Lactic Acid, Venous    Component Value Date/Time   LATICACIDVEN 1.23 04/06/2015 1727  Na 139 K 5.5  Cr    stable,    Lab Results  Component Value Date   CREATININE 0.94 06/02/2018   CREATININE 1.03 (H) 12/02/2017   CREATININE 0.85 12/01/2017     WBC 18.1  HG/HCT  Up from baseline see below    Component Value Date/Time   HGB 15.2 (H) 06/02/2018 1720   HCT 47.1 (H) 06/02/2018 1720    Troponin (Point of Care Test) No results for input(s): TROPIPOC in the last 72 hours.     UA nitrites are positive WBC 21-50 rare bacteria   CXR -  Cardiomegaly trace pleural effsuions   ECG:  Personally reviewed by me showing: HR : 79 Rhythm:  NSR,    no evidence of ischemic changes QTC 402    ED Triage Vitals  Enc Vitals Group     BP 06/02/18 1701 (!) 146/70     Pulse Rate 06/02/18 1701 85     Resp 06/02/18 1701 16     Temp 06/02/18 1701  98.8 F (37.1 C)     Temp Source 06/02/18 1701 Oral     SpO2 06/02/18 1701 94 %     Weight 06/02/18 1703 120 lb (54.4 kg)     Height 06/02/18 1703 4\' 11"  (1.499 m)     Head Circumference --      Peak Flow --      Pain Score --      Pain Loc --      Pain Edu? --      Excl. in [place]? --   TMAX(24)@       Latest  Blood pressure (!) 165/80, pulse 88, temperature 98.3 F (36.8 C), temperature source Oral, resp. rate 20, height 4\' 11"  (1.499 m), weight 54.4 kg, SpO2 92 %.   Hospitalist was called for admission forin tractable nausea nausea vomiting pyelonephritis   Review of Systems:    Pertinent positives include:  Fatigue nausea, vomiting,  Constitutional:  No weight loss, night sweats, Fevers, chills, weight loss  HEENT:  No headaches, Difficulty swallowing,Tooth/dental problems,Sore throat,  No sneezing, itching, ear ache, nasal congestion, post nasal drip,  Cardio-vascular:  No chest pain, Orthopnea, PND, anasarca, dizziness, palpitations.no Bilateral lower extremity swelling  GI:  No heartburn, indigestion,  diarrhea, change in bowel habits, loss of appetite, melena, blood in stool, hematemesis Resp:  no shortness of breath at rest. No dyspnea on exertion, No excess mucus, no productive cough, No non-productive cough, No coughing up of blood.No change in color of mucus.No wheezing. Skin:  no rash or lesions. No jaundice GU:  no dysuria, change in color of urine, no urgency or frequency. No straining to urinate.  No flank pain.  Musculoskeletal:  No joint pain or no joint swelling. No decreased range of motion. No back pain.  Psych:  No change in mood or affect. No depression or anxiety. No memory loss.  Neuro: no localizing neurological complaints, no tingling, no weakness, no double vision, no gait abnormality, no slurred speech, no confusion  All systems reviewed and apart from [place] all are negative  Past Medical History:   Past Medical History:  Diagnosis Date  .  CAD (coronary artery disease)    nonobstructive cath 2006  . Cholelithiases   . COPD (chronic obstructive pulmonary disease) ([place])   . Dementia ([place])   . Diastolic heart failure ([place])   . HTN (hypertension)   . Hyperlipidemia   . Melanoma ([place])   . Osteoarthritis   .  Osteoporosis   . Pubic ramus fracture ([place]) 12/01/2017      Past Surgical History:  Procedure Laterality Date  . APPENDECTOMY    . BLADDER SUSPENSION     2006  . BREAST LUMPECTOMY    . CARPAL TUNNEL RELEASE     2007  . CATARACT EXTRACTION    . CHOLECYSTECTOMY    . LYMPH NODE BIOPSY    . MELANOMA EXCISION     2008  . PAROTIDECTOMY     1959  . TONSILLECTOMY AND ADENOIDECTOMY      Social History:  Ambulatory   walker or  wheelchair bound,       reports that she has never smoked. She has never used smokeless tobacco. She reports that she does not drink alcohol or use drugs.     Family History:   Family History  Problem Relation Age of Onset  . CAD Father        Sudden death age 16  . CAD Son 27       MI.  Alive in his 52s  . Cancer Daughter        Breast  . Pancreatitis Daughter        Severe episode    Allergies: Allergies  Allergen Reactions  . Voltaren Ophthalmic [Diclofenac] Other (See Comments)    Reaction not recalled by family ??     Prior to Admission medications   Medication Sig Start Date End Date Taking? Authorizing Provider  aspirin 81 MG chewable tablet Chew 81 mg by mouth 2 (two) times daily.   Yes [provider]  bisacodyl (DULCOLAX) 5 MG EC tablet Take 1 tablet (5 mg total) by mouth daily as needed for moderate constipation. 12/03/17  Yes Regalado, Belkys A, MD  Calcium Carb-Cholecalciferol (CALCIUM 600+D3) 600-200 MG-UNIT TABS Take 1 tablet by mouth daily.   Yes [provider]  Cholecalciferol (VITAMIN D-3) 1000 units CAPS Take 1,000 Units by mouth daily.   Yes [provider]  citalopram (CELEXA) 20 MG tablet Take 20 mg by mouth daily.   Yes  [provider]  Coenzyme Q10 (CO Q 10) 10 MG CAPS Take 10 mg by mouth daily.   Yes [provider]  HYDROcodone-acetaminophen (NORCO) 5-325 MG tablet Take 0.5 tablets by mouth every 6 (six) hours as needed for moderate pain or severe pain. 12/03/17  Yes Regalado, Belkys A, MD  ipratropium-albuterol (DUONEB) 0.5-2.5 (3) MG/3ML SOLN Take 3 mLs by nebulization 2 (two) times daily. 12/03/17  Yes Regalado, Belkys A, MD  OVER THE COUNTER MEDICATION "ICAPS Multivitamin + Lutein tablets: Take 1 tablet by mouth once a day   Yes [provider]  polyethylene glycol (MIRALAX / GLYCOLAX) packet Take 17 g by mouth daily. 12/03/17  Yes Regalado, Belkys A, MD  senna-docusate (SENOKOT-S) 8.6-50 MG tablet Take 1 tablet by mouth at bedtime. 12/03/17  Yes Regalado, Belkys A, MD  thyroid (NP THYROID) 60 MG tablet Take 60 mg by mouth daily.   Yes [provider]  enoxaparin (LOVENOX) 30 MG/0.3ML injection Inject 0.3 mLs (30 mg total) into the skin daily. Patient not taking: Reported on 06/02/2018 12/03/17   Elmarie Shiley, MD   Physical Exam: Blood pressure (!) 165/80, pulse 88, temperature 98.3 F (36.8 C), temperature source Oral, resp. rate 20, height 4\' 11"  (1.499 m), weight 54.4 kg, SpO2 92 %. 1. General:  in No Acute distress  Chronically ill  -appearing 2. Psychological: Alert and  Oriented 3. Head/ENT:  Dry Mucous Membranes                          Head Non traumatic, neck supple                           Poor Dentition 4. SKIN:   decreased Skin turgor,  Skin clean Dry and intact no rash 5. Heart: Regular rate and rhythm systolic Murmur, no Rub or gallop 6. Lungs:  no wheezes or crackles   7. Abdomen: Soft,  non-tender, Non distended  obese  bowel sounds present 8. Lower extremities: no clubbing, cyanosis, or edema 9. Neurologically Grossly intact, moving all 4 extremities equally   10. MSK: Normal range of motion   LABS:     Recent Labs  Lab 06/02/18 1720  WBC  18.1*  HGB 15.2*  HCT 47.1*  MCV 96.9  PLT 856   Basic Metabolic Panel: Recent Labs  Lab 06/02/18 1720  NA 139  K 5.5*  CL 103  CO2 24  GLUCOSE 145*  BUN 19  CREATININE 0.94  CALCIUM 8.8*      Recent Labs  Lab 06/02/18 1720  AST 38  ALT 16  ALKPHOS 49  BILITOT 1.6*  PROT 6.5  ALBUMIN 3.9   Recent Labs  Lab 06/02/18 1720  LIPASE 33   No results for input(s): AMMONIA in the last 168 hours.    HbA1C: No results for input(s): HGBA1C in the last 72 hours. CBG: No results for input(s): GLUCAP in the last 168 hours.    Urine analysis:    Component Value Date/Time   COLORURINE YELLOW 06/02/2018 1942   APPEARANCEUR HAZY (A) 06/02/2018 1942   LABSPEC 1.014 06/02/2018 1942   PHURINE 6.0 06/02/2018 1942   GLUCOSEU NEGATIVE 06/02/2018 1942   HGBUR SMALL (A) 06/02/2018 1942   BILIRUBINUR NEGATIVE 06/02/2018 1942   KETONESUR NEGATIVE 06/02/2018 1942   PROTEINUR 30 (A) 06/02/2018 1942   UROBILINOGEN 0.2 04/06/2015 1722   NITRITE POSITIVE (A) 06/02/2018 1942   LEUKOCYTESUR LARGE (A) 06/02/2018 1942    Cultures:    Component Value Date/Time   SDES URINE, CLEAN CATCH 04/06/2015 1722   SPECREQUEST NONE 04/06/2015 1722   CULT  04/06/2015 1722    9,000 COLONIES/mL INSIGNIFICANT GROWTH Performed at [facility] 04/08/2015 FINAL 04/06/2015 1722     Radiological Exams on Admission: Dg Acute Abd 2+v Abd (supine,erect,decub) + 1v Chest  Result Date: 06/02/2018 CLINICAL DATA:  Vomiting EXAM: DG ABDOMEN ACUTE W/ 1V CHEST COMPARISON:  12/02/2017, chest x-ray 04/06/2015 FINDINGS: Single view chest demonstrates cardiomegaly with vascular congestion. Probable small pleural effusions. Aortic atherosclerosis. No pneumothorax. Stable sclerotic lesion in the proximal left humerus. Supine and upright views of the abdomen demonstrate no free air beneath the diaphragm. Surgical clips in the right upper quadrant. Nonobstructed bowel-gas pattern. Extensive  vascular calcifications. IMPRESSION: 1. Cardiomegaly with vascular congestion and probable trace pleural effusion 2. Nonobstructed bowel-gas pattern Electronically Signed   By: Donavan Foil M.D.   On: 06/02/2018 21:15    Chart has been reviewed    Assessment/Plan  85 y.o. female with medical history significant of HLD; HTN; diastolic heart failure; COPD; CAD; and dementia hypothyroidism    Admitted for pyelonephritis hyperkalemia  Present on Admission:  . questionable Pyelonephritis vs Pyuria  evidence of pyuria elevated white blood cell count nausea and vomiting.  Patient unable to provide detailed history  regarding abdominal discomfort.  Await results of urine culture.  Patient already received Rocephin in the emergency department -continue for now and reassess once urine culture is back . Dehydration -gently will rehydrate to avoid fluid overload . Intractable nausea and vomiting -KUB unremarkable abdomen nontender unclear etiology at this point no diarrhea given risk factors will cycle cardiac enzymes as patient unable to provide detailed history could be anginal equivalent in this elderly female.  No associated diarrhea.  Supportive management for tonight Cardiomegaly -avoid over aggressive fluid resuscitation more of a gentle approach and follow care for the fluid status.  She has history of diastolic CHF.  Discussed with family overall goals of care at this point they would like to avoid over aggressive interventions such as cardiac catheterizations but will like to know for prognostication purposes of patient's having any ischemic events  . Dementia ([place]) -expect some degree of sundowning while hospitalized monitor for any signs of delirium, back to memory care at the time of discharge, social work consult as patient currently residing facility . Hyperlipidemia -stable continue home medications . Hypertension -not on home medications we will continue to monitor  . Hypothyroidism - -  Check TSH continue home medications at current dose Hyperkalemia -no EKG changes monitor on telemetry recheck after fluid rehydration  Other plan as per orders.  DVT prophylaxis:    Lovenox     Code Status:    DNR/DNI  as per  family  I had personally discussed CODE STATUS with patient and family   Family Communication:   Family  at  Bedside  plan of care was discussed with 2  Daughters,   Disposition Plan:                              Back to current facility when stable                                             Would benefit from PT/OT eval prior to DC  Ordered                      Social Work  consulted                                       Consults called: none     Admission status:    Obs    Level of care     tele  For  24H           Caitlain Tweed 06/03/2018, 12:57 AM    [facility]  Pager [phone]   after 2 AM please page floor coverage PA If 7AM-7PM, please contact the day team taking care of the patient  [email or web address]  Password [email or web address]

## 2018-06-02 NOTE — ED Notes (Signed)
Patient given fluids. Pt denies nausea and has had no emesis since arrival. Pt's daughter is concerned about pt's yellow skin tone and states her mom "sounds wheezy"

## 2018-06-02 NOTE — ED Notes (Signed)
ED TO INPATIENT HANDOFF REPORT  Name/Age/Gender Alice Hayes 85 y.o. female  Code Status Code Status History    Date Active Date Inactive Code Status Order ID Comments User Context   12/01/2017 0755 12/03/2017 1655 DNR [id number]  Alice Jumbo, PA-C ED   12/01/2017 0726 12/01/2017 0755 Full Code [id number]  Alice Jumbo, PA-C ED    Questions for Most Recent Historical Code Status (Order [id number])    Question Answer Comment   In the event of cardiac or respiratory ARREST Do not call a "code blue"    In the event of cardiac or respiratory ARREST Do not perform Intubation, CPR, defibrillation or ACLS    In the event of cardiac or respiratory ARREST Use medication by any route, position, wound care, and other measures to relive pain and suffering. May use oxygen, suction and manual treatment of airway obstruction as needed for comfort.         Advance Directive Documentation     Most Recent Value  Type of Advance Directive  Healthcare Power of Attorney, Living will, Out of facility DNR (pink MOST or yellow form)  Pre-existing out of facility DNR order (yellow form or pink MOST form)  --  "MOST" Form in Place?  --      Home/SNF/Other Home  Chief Complaint Emesis  Level of Care/Admitting Diagnosis ED Disposition    ED Disposition Condition Alice Hayes: [facility] [[id number]]  Level of Care: Telemetry [5]  Admit to tele based on following criteria: Other see comments  Comments: hyperkalemia  Diagnosis: Pyelonephritis [299371]  Admitting Physician: Toy Baker [[id number]]  Attending Physician: Toy Baker [[id number]]  PT Class (Do Not Modify): Observation [104]  PT Acc Code (Do Not Modify): Observation [10022]       Medical History Past Medical History:  Diagnosis Date  . CAD (coronary artery disease)    nonobstructive cath 2006  . Cholelithiases   . COPD (chronic obstructive pulmonary disease) ([place])   . Dementia ([place])    . Diastolic heart failure ([place])   . HTN (hypertension)   . Hyperlipidemia   . Melanoma ([place])   . Osteoarthritis   . Osteoporosis   . Pubic ramus fracture ([facility]) 12/01/2017    Allergies Allergies  Allergen Reactions  . Voltaren Ophthalmic [Diclofenac] Other (See Comments)    Reaction not recalled by family ??    IV Location/Drains/Wounds Patient Lines/Drains/Airways Status   Active Line/Drains/Airways    Name:   Placement date:   Placement time:   Site:   Days:   Peripheral IV 12/01/17 Right Antecubital   12/01/17    0623    Antecubital   183   Peripheral IV 06/02/18 Right Forearm   06/02/18    1719    Forearm   less than 1   External Urinary Catheter   12/02/17    2141    --   182          Labs/Imaging Results for orders placed or performed during the hospital encounter of 06/02/18 (from the past 48 hour(s))  Lipase, blood     Status: None   Collection Time: 06/02/18  5:20 PM  Result Value Ref Range   Lipase 33 11 - 51 U/L    Comment: Performed at [facility], [place] [address], [place], [place] [address]  Comprehensive metabolic panel     Status: Abnormal   Collection Time: 06/02/18  5:20 PM  Result Value  Ref Range   Sodium 139 135 - 145 mmol/L   Potassium 5.5 (H) 3.5 - 5.1 mmol/L    Comment: SLIGHT HEMOLYSIS   Chloride 103 98 - 111 mmol/L   CO2 24 22 - 32 mmol/L   Glucose, Bld 145 (H) 70 - 99 mg/dL   BUN 19 8 - 23 mg/dL   Creatinine, Ser 0.94 0.44 - 1.00 mg/dL   Calcium 8.8 (L) 8.9 - 10.3 mg/dL   Total Protein 6.5 6.5 - 8.1 g/dL   Albumin 3.9 3.5 - 5.0 g/dL   AST 38 15 - 41 U/L   ALT 16 0 - 44 U/L   Alkaline Phosphatase 49 38 - 126 U/L   Total Bilirubin 1.6 (H) 0.3 - 1.2 mg/dL   GFR calc non Af Amer 52 (L) >60 mL/min   GFR calc Af Amer >60 >60 mL/min   Anion gap 12 5 - 15    Comment: Performed at [facility], [place] [address], [place], [place] [address]  CBC     Status: Abnormal   Collection Time: 06/02/18  5:20 PM   Result Value Ref Range   WBC 18.1 (H) 4.0 - 10.5 K/uL   RBC 4.86 3.87 - 5.11 MIL/uL   Hemoglobin 15.2 (H) 12.0 - 15.0 g/dL   HCT 47.1 (H) 36.0 - 46.0 %   MCV 96.9 80.0 - 100.0 fL   MCH 31.3 26.0 - 34.0 pg   MCHC 32.3 30.0 - 36.0 g/dL   RDW 17.7 (H) 11.5 - 15.5 %   Platelets 265 150 - 400 K/uL   nRBC 0.0 0.0 - 0.2 %    Comment: Performed at [facility], [place] [address], [place],  [address]  Urinalysis, Routine w reflex microscopic     Status: Abnormal   Collection Time: 06/02/18  7:42 PM  Result Value Ref Range   Color, Urine YELLOW YELLOW   APPearance HAZY (A) CLEAR   Specific Gravity, Urine 1.014 1.005 - 1.030   pH 6.0 5.0 - 8.0   Glucose, UA NEGATIVE NEGATIVE mg/dL   Hgb urine dipstick SMALL (A) NEGATIVE   Bilirubin Urine NEGATIVE NEGATIVE   Ketones, ur NEGATIVE NEGATIVE mg/dL   Protein, ur 30 (A) NEGATIVE mg/dL   Nitrite POSITIVE (A) NEGATIVE   Leukocytes, UA LARGE (A) NEGATIVE   RBC / HPF 0-5 0 - 5 RBC/hpf   WBC, UA 21-50 0 - 5 WBC/hpf   Bacteria, UA RARE (A) NONE SEEN   Squamous Epithelial / LPF 11-20 0 - 5    Comment: Performed at [facility], [place] [address], [place],  [address]   Dg Acute Abd 2+v Abd (supine,erect,decub) + 1v Chest  Result Date: 06/02/2018 CLINICAL DATA:  Vomiting EXAM: DG ABDOMEN ACUTE W/ 1V CHEST COMPARISON:  12/02/2017, chest x-ray 04/06/2015 FINDINGS: Single view chest demonstrates cardiomegaly with vascular congestion. Probable small pleural effusions. Aortic atherosclerosis. No pneumothorax. Stable sclerotic lesion in the proximal left humerus. Supine and upright views of the abdomen demonstrate no free air beneath the diaphragm. Surgical clips in the right upper quadrant. Nonobstructed bowel-gas pattern. Extensive vascular calcifications. IMPRESSION: 1. Cardiomegaly with vascular congestion and probable trace pleural effusion 2. Nonobstructed bowel-gas pattern Electronically Signed   By: Donavan Foil M.D.   On: 06/02/2018 21:15   None  Pending Labs Unresulted Labs (From admission, onward)    Start     Ordered   06/02/18 2224  Brain natriuretic peptide  Add-on,   R  06/02/18 2223   06/02/18 2219  Troponin I - Now Then Q6H  Now then every 6 hours,   R     06/02/18 2218   06/02/18 2218  Troponin I - Add-On to previous collection  Add-on,   R     06/02/18 2218   06/02/18 2214  Culture, blood (x 2)  BLOOD CULTURE X 2,   STAT    Comments:  INITIATE ANTIBIOTICS WITHIN 1 HOUR AFTER BLOOD CULTURES DRAWN.  If unable to obtain blood cultures, call MD immediately regarding antibiotic instructions.    06/02/18 2216   06/02/18 2214  Lactic acid, plasma  STAT Now then every 3 hours,   STAT     06/02/18 2216   06/02/18 2214  Procalcitonin  ONCE - STAT,   R     06/02/18 2216   06/02/18 2023  Urine Culture  Once,   STAT     06/02/18 2022          Vitals/Pain Today's Vitals   06/02/18 1701 06/02/18 1703 06/02/18 1818  BP: (!) 146/70  (!) 165/80  Pulse: 85  88  Resp: 16  20  Temp: 98.8 F (37.1 C)  98.3 F (36.8 C)  TempSrc: Oral  Oral  SpO2: 94%  92%  Weight:  54.4 kg   Height:  4\' 11"  (1.499 m)     Isolation Precautions No active isolations  Medications Medications  cefTRIAXone (ROCEPHIN) 1 g in sodium chloride 0.9 % 100 mL IVPB (has no administration in time range)  sodium chloride 0.9 % bolus 1,000 mL (0 mLs Intravenous Stopped 06/02/18 1919)  ondansetron (ZOFRAN) injection 4 mg (4 mg Intravenous Given 06/02/18 1728)  sodium chloride 0.9 % bolus 500 mL (0 mLs Intravenous Stopped 06/02/18 2020)  sodium chloride 0.9 % bolus 500 mL (500 mLs Intravenous New Bag/Given 06/02/18 2034)  ondansetron (ZOFRAN) injection 4 mg (4 mg Intravenous Given 06/02/18 2030)  cefTRIAXone (ROCEPHIN) 1 g in sodium chloride 0.9 % 100 mL IVPB (1 g Intravenous New Bag/Given 06/02/18 2031)    Mobility non-ambulatory

## 2018-06-02 NOTE — ED Notes (Signed)
Pt given ginger ale. Told Pt to sip it slowly. Daughter is at bedside and helping her.

## 2018-06-02 NOTE — ED Triage Notes (Signed)
Per EMS: Pt is coming from [facility] [place] and pt has had 3 rounds of emesis. Staff at facility reports the emesis was yellow in color and they want further evaluation.  Pt is at baseline per facility.

## 2018-06-02 NOTE — ED Notes (Signed)
Pt placed on purewick cathether as pt is incontinent. Pericare was performed prior to placement. Pt was cleaned as she had some stool in her depend.

## 2018-06-02 NOTE — ED Provider Notes (Signed)
[facility] DEPT Provider Note   CSN: [id number] Arrival date & time: 06/02/18  1648     History   Chief Complaint Chief Complaint  Patient presents with  . Emesis    HPI Alice Hayes is a 85 y.o. female.  Patient with nausea and vomiting onset today. Emesis is episodic, persistent, 3-4 episodes, acute onset. Emesis clear to yellowish, not bloody or bilious. No fevers. Denies abd pain. No known bad food ingestion or ill contacts. No cough or uri symptoms. No gu c/o.  The history is provided by the patient and a relative. The history is limited by the condition of the patient.  Emesis   Pertinent negatives include no abdominal pain, no diarrhea, no fever and no headaches.    Past Medical History:  Diagnosis Date  . CAD (coronary artery disease)    nonobstructive cath 2006  . Cholelithiases   . COPD (chronic obstructive pulmonary disease) ([place])   . Dementia ([place])   . Diastolic heart failure ([place])   . HTN (hypertension)   . Hyperlipidemia   . Melanoma ([place])   . Osteoarthritis   . Osteoporosis   . Pubic ramus fracture ([place]) 12/01/2017    Patient Active Problem List   Diagnosis Date Noted  . Hypothyroidism 12/01/2017  . Dementia ([place]) 12/01/2017  . Hyperlipidemia 12/01/2017  . Fracture of pubic ramus ([place]) 12/01/2017  . Hypertension 12/01/2017    Past Surgical History:  Procedure Laterality Date  . APPENDECTOMY    . BLADDER SUSPENSION     2006  . BREAST LUMPECTOMY    . CARPAL TUNNEL RELEASE     2007  . CATARACT EXTRACTION    . CHOLECYSTECTOMY    . LYMPH NODE BIOPSY    . MELANOMA EXCISION     2008  . PAROTIDECTOMY     1959  . TONSILLECTOMY AND ADENOIDECTOMY       OB History    Gravida      Para      Term      Preterm      AB      Living  4     SAB      TAB      Ectopic      Multiple      Live Births               Home Medications    Prior to Admission medications   Medication Sig Start  Date End Date Taking? Authorizing Provider  bisacodyl (DULCOLAX) 5 MG EC tablet Take 1 tablet (5 mg total) by mouth daily as needed for moderate constipation. 12/03/17   Regalado, Belkys A, MD  Calcium Carb-Cholecalciferol (CALCIUM 600+D3) 600-200 MG-UNIT TABS Take 1 tablet by mouth daily.    [provider]  Cholecalciferol (VITAMIN D-3) 1000 units CAPS Take 1,000 Units by mouth daily.    [provider]  citalopram (CELEXA) 20 MG tablet Take 20 mg by mouth daily.    [provider]  Coenzyme Q10 (CO Q 10) 10 MG CAPS Take 10 mg by mouth daily.    [provider]  enoxaparin (LOVENOX) 30 MG/0.3ML injection Inject 0.3 mLs (30 mg total) into the skin daily. 12/03/17   Regalado, Belkys A, MD  HYDROcodone-acetaminophen (NORCO) 5-325 MG tablet Take 0.5 tablets by mouth every 6 (six) hours as needed for moderate pain or severe pain. 12/03/17   Regalado, Belkys A, MD  ipratropium-albuterol (DUONEB) 0.5-2.5 (3) MG/3ML SOLN Take 3  mLs by nebulization 2 (two) times daily. 12/03/17   Regalado, Cassie Freer, MD  OVER THE COUNTER MEDICATION "ICAPS Multivitamin + Lutein tablets: Take 1 tablet by mouth once a day    [provider]  polyethylene glycol (MIRALAX / GLYCOLAX) packet Take 17 g by mouth daily. 12/03/17   Regalado, Belkys A, MD  senna-docusate (SENOKOT-S) 8.6-50 MG tablet Take 1 tablet by mouth at bedtime. 12/03/17   Regalado, Belkys A, MD  thyroid (NP THYROID) 60 MG tablet Take 60 mg by mouth daily.    [provider]    Family History Family History  Problem Relation Age of Onset  . CAD Father        Sudden death age 83  . CAD Son 59       MI.  Alive in his 21s  . Cancer Daughter        Breast  . Pancreatitis Daughter        Severe episode    Social History Social History   Tobacco Use  . Smoking status: Never Smoker  . Smokeless tobacco: Never Used  Substance Use Topics  . Alcohol use: Never    Frequency: Never  . Drug use: Never      Allergies   Voltaren ophthalmic [diclofenac]   Review of Systems Review of Systems  Constitutional: Negative for fever.  HENT: Negative for sore throat.   Eyes: Negative for redness.  Respiratory: Negative for shortness of breath.   Cardiovascular: Negative for chest pain.  Gastrointestinal: Positive for vomiting. Negative for abdominal distention, abdominal pain and diarrhea.  Genitourinary: Negative for dysuria and flank pain.  Musculoskeletal: Negative for back pain.  Skin: Negative for rash.  Neurological: Negative for headaches.  Hematological: Does not bruise/bleed easily.  Psychiatric/Behavioral: Negative for confusion.     Physical Exam Updated Vital Signs BP (!) 146/70 (BP Location: Right Arm)   Pulse 85   Temp 98.8 F (37.1 C) (Oral)   Resp 16   Ht 1.499 m (4\' 11" )   Wt 54.4 kg   SpO2 94%   BMI 24.24 kg/m   Physical Exam Vitals signs and nursing note reviewed.  Constitutional:      Appearance: Normal appearance. She is well-developed.  HENT:     Head: Atraumatic.     Nose: Nose normal.     Mouth/Throat:     Mouth: Mucous membranes are moist.     Pharynx: Oropharynx is clear.  Eyes:     General: No scleral icterus.    Conjunctiva/sclera: Conjunctivae normal.     Pupils: Pupils are equal, round, and reactive to light.  Neck:     Musculoskeletal: Normal range of motion and neck supple. No neck rigidity.     Trachea: No tracheal deviation.  Cardiovascular:     Rate and Rhythm: Normal rate and regular rhythm.     Pulses: Normal pulses.     Heart sounds: Normal heart sounds. No murmur. No friction rub. No gallop.   Pulmonary:     Effort: Pulmonary effort is normal. No respiratory distress.     Breath sounds: Normal breath sounds.  Abdominal:     General: Bowel sounds are normal. There is no distension.     Palpations: Abdomen is soft. There is no mass.     Tenderness: There is no abdominal tenderness. There is no guarding.  Genitourinary:     Comments: No cva tenderness.  Musculoskeletal:        General: No swelling.  Skin:  General: Skin is warm and dry.     Findings: No rash.  Neurological:     Mental Status: She is alert.     Comments: Awake and alert. Mental status c/w baseline.   Psychiatric:        Mood and Affect: Mood normal.      ED Treatments / Results  Labs (all labs ordered are listed, but only abnormal results are displayed) Results for orders placed or performed during the hospital encounter of 06/02/18  Lipase, blood  Result Value Ref Range   Lipase 33 11 - 51 U/L  Comprehensive metabolic panel  Result Value Ref Range   Sodium 139 135 - 145 mmol/L   Potassium 5.5 (H) 3.5 - 5.1 mmol/L   Chloride 103 98 - 111 mmol/L   CO2 24 22 - 32 mmol/L   Glucose, Bld 145 (H) 70 - 99 mg/dL   BUN 19 8 - 23 mg/dL   Creatinine, Ser 0.94 0.44 - 1.00 mg/dL   Calcium 8.8 (L) 8.9 - 10.3 mg/dL   Total Protein 6.5 6.5 - 8.1 g/dL   Albumin 3.9 3.5 - 5.0 g/dL   AST 38 15 - 41 U/L   ALT 16 0 - 44 U/L   Alkaline Phosphatase 49 38 - 126 U/L   Total Bilirubin 1.6 (H) 0.3 - 1.2 mg/dL   GFR calc non Af Amer 52 (L) >60 mL/min   GFR calc Af Amer >60 >60 mL/min   Anion gap 12 5 - 15  CBC  Result Value Ref Range   WBC 18.1 (H) 4.0 - 10.5 K/uL   RBC 4.86 3.87 - 5.11 MIL/uL   Hemoglobin 15.2 (H) 12.0 - 15.0 g/dL   HCT 47.1 (H) 36.0 - 46.0 %   MCV 96.9 80.0 - 100.0 fL   MCH 31.3 26.0 - 34.0 pg   MCHC 32.3 30.0 - 36.0 g/dL   RDW 17.7 (H) 11.5 - 15.5 %   Platelets 265 150 - 400 K/uL   nRBC 0.0 0.0 - 0.2 %  Urinalysis, Routine w reflex microscopic  Result Value Ref Range   Color, Urine YELLOW YELLOW   APPearance HAZY (A) CLEAR   Specific Gravity, Urine 1.014 1.005 - 1.030   pH 6.0 5.0 - 8.0   Glucose, UA NEGATIVE NEGATIVE mg/dL   Hgb urine dipstick SMALL (A) NEGATIVE   Bilirubin Urine NEGATIVE NEGATIVE   Ketones, ur NEGATIVE NEGATIVE mg/dL   Protein, ur 30 (A) NEGATIVE mg/dL   Nitrite POSITIVE (A) NEGATIVE    Leukocytes, UA LARGE (A) NEGATIVE   RBC / HPF 0-5 0 - 5 RBC/hpf   WBC, UA 21-50 0 - 5 WBC/hpf   Bacteria, UA RARE (A) NONE SEEN   Squamous Epithelial / LPF 11-20 0 - 5   Dg Acute Abd 2+v Abd (supine,erect,decub) + 1v Chest  Result Date: 06/02/2018 CLINICAL DATA:  Vomiting EXAM: DG ABDOMEN ACUTE W/ 1V CHEST COMPARISON:  12/02/2017, chest x-ray 04/06/2015 FINDINGS: Single view chest demonstrates cardiomegaly with vascular congestion. Probable small pleural effusions. Aortic atherosclerosis. No pneumothorax. Stable sclerotic lesion in the proximal left humerus. Supine and upright views of the abdomen demonstrate no free air beneath the diaphragm. Surgical clips in the right upper quadrant. Nonobstructed bowel-gas pattern. Extensive vascular calcifications. IMPRESSION: 1. Cardiomegaly with vascular congestion and probable trace pleural effusion 2. Nonobstructed bowel-gas pattern Electronically Signed   By: Donavan Foil M.D.   On: 06/02/2018 21:15    EKG None  Radiology Dg Acute Abd 2+v  Abd (supine,erect,decub) + 1v Chest  Result Date: 06/02/2018 CLINICAL DATA:  Vomiting EXAM: DG ABDOMEN ACUTE W/ 1V CHEST COMPARISON:  12/02/2017, chest x-ray 04/06/2015 FINDINGS: Single view chest demonstrates cardiomegaly with vascular congestion. Probable small pleural effusions. Aortic atherosclerosis. No pneumothorax. Stable sclerotic lesion in the proximal left humerus. Supine and upright views of the abdomen demonstrate no free air beneath the diaphragm. Surgical clips in the right upper quadrant. Nonobstructed bowel-gas pattern. Extensive vascular calcifications. IMPRESSION: 1. Cardiomegaly with vascular congestion and probable trace pleural effusion 2. Nonobstructed bowel-gas pattern Electronically Signed   By: Donavan Foil M.D.   On: 06/02/2018 21:15    Procedures Procedures (including critical care time)  Medications Ordered in ED Medications  sodium chloride 0.9 % bolus 1,000 mL (1,000 mLs Intravenous  New Bag/Given 06/02/18 1723)  ondansetron (ZOFRAN) injection 4 mg (has no administration in time range)     Initial Impression / Assessment and Plan / ED Course  I have reviewed the triage vital signs and the nursing notes.  Pertinent labs & imaging results that were available during my care of the patient were reviewed by me and considered in my medical decision making (see chart for details).  Iv ns bolus. zofran iv. Labs.   Reviewed nursing notes and prior charts for additional history.   Labs reviewed - wbc elevated.   xrays reviewed - no sbo.  Recurrent episodes emesis in ED. Additional zofran iv. Additional ivf.   uti on labs - u cx sent. Rocephin iv.   Given persistent vomiting, uti/elevated wbc - will admit.  Hospitalists consulted for admission.    Final Clinical Impressions(s) / ED Diagnoses   Final diagnoses:  None    ED Discharge Orders    None       Lajean Saver, MD 06/02/18 2124

## 2018-06-02 NOTE — ED Notes (Signed)
Bed: WA01 Expected date:  Expected time:  Means of arrival:  Comments: EMS vomiting

## 2018-06-03 ENCOUNTER — Other Ambulatory Visit: Payer: Self-pay

## 2018-06-03 ENCOUNTER — Observation Stay (HOSPITAL_BASED_OUTPATIENT_CLINIC_OR_DEPARTMENT_OTHER): Payer: Medicare Other

## 2018-06-03 DIAGNOSIS — F0391 Unspecified dementia with behavioral disturbance: Secondary | ICD-10-CM | POA: Diagnosis not present

## 2018-06-03 DIAGNOSIS — R110 Nausea: Secondary | ICD-10-CM | POA: Diagnosis not present

## 2018-06-03 DIAGNOSIS — B9620 Unspecified Escherichia coli [E. coli] as the cause of diseases classified elsewhere: Secondary | ICD-10-CM | POA: Diagnosis not present

## 2018-06-03 DIAGNOSIS — I110 Hypertensive heart disease with heart failure: Secondary | ICD-10-CM | POA: Diagnosis not present

## 2018-06-03 DIAGNOSIS — R1111 Vomiting without nausea: Secondary | ICD-10-CM | POA: Diagnosis not present

## 2018-06-03 DIAGNOSIS — I2510 Atherosclerotic heart disease of native coronary artery without angina pectoris: Secondary | ICD-10-CM | POA: Diagnosis present

## 2018-06-03 DIAGNOSIS — F0150 Vascular dementia without behavioral disturbance: Secondary | ICD-10-CM | POA: Diagnosis not present

## 2018-06-03 DIAGNOSIS — M810 Age-related osteoporosis without current pathological fracture: Secondary | ICD-10-CM | POA: Diagnosis present

## 2018-06-03 DIAGNOSIS — I5032 Chronic diastolic (congestive) heart failure: Secondary | ICD-10-CM | POA: Diagnosis not present

## 2018-06-03 DIAGNOSIS — Z993 Dependence on wheelchair: Secondary | ICD-10-CM | POA: Diagnosis not present

## 2018-06-03 DIAGNOSIS — E860 Dehydration: Secondary | ICD-10-CM | POA: Diagnosis not present

## 2018-06-03 DIAGNOSIS — I313 Pericardial effusion (noninflammatory): Secondary | ICD-10-CM | POA: Diagnosis not present

## 2018-06-03 DIAGNOSIS — Z8781 Personal history of (healed) traumatic fracture: Secondary | ICD-10-CM | POA: Diagnosis not present

## 2018-06-03 DIAGNOSIS — N390 Urinary tract infection, site not specified: Secondary | ICD-10-CM | POA: Diagnosis not present

## 2018-06-03 DIAGNOSIS — N12 Tubulo-interstitial nephritis, not specified as acute or chronic: Secondary | ICD-10-CM | POA: Diagnosis not present

## 2018-06-03 DIAGNOSIS — J449 Chronic obstructive pulmonary disease, unspecified: Secondary | ICD-10-CM | POA: Diagnosis present

## 2018-06-03 DIAGNOSIS — Z9181 History of falling: Secondary | ICD-10-CM | POA: Diagnosis not present

## 2018-06-03 DIAGNOSIS — E870 Hyperosmolality and hypernatremia: Secondary | ICD-10-CM | POA: Diagnosis not present

## 2018-06-03 DIAGNOSIS — Z7401 Bed confinement status: Secondary | ICD-10-CM | POA: Diagnosis not present

## 2018-06-03 DIAGNOSIS — I361 Nonrheumatic tricuspid (valve) insufficiency: Secondary | ICD-10-CM | POA: Diagnosis not present

## 2018-06-03 DIAGNOSIS — E785 Hyperlipidemia, unspecified: Secondary | ICD-10-CM | POA: Diagnosis present

## 2018-06-03 DIAGNOSIS — Z9089 Acquired absence of other organs: Secondary | ICD-10-CM | POA: Diagnosis not present

## 2018-06-03 DIAGNOSIS — E039 Hypothyroidism, unspecified: Secondary | ICD-10-CM | POA: Diagnosis present

## 2018-06-03 DIAGNOSIS — Z9049 Acquired absence of other specified parts of digestive tract: Secondary | ICD-10-CM | POA: Diagnosis not present

## 2018-06-03 DIAGNOSIS — I3139 Other pericardial effusion (noninflammatory): Secondary | ICD-10-CM

## 2018-06-03 DIAGNOSIS — E872 Acidosis: Secondary | ICD-10-CM | POA: Diagnosis not present

## 2018-06-03 DIAGNOSIS — I082 Rheumatic disorders of both aortic and tricuspid valves: Secondary | ICD-10-CM | POA: Diagnosis not present

## 2018-06-03 DIAGNOSIS — Z85820 Personal history of malignant melanoma of skin: Secondary | ICD-10-CM | POA: Diagnosis not present

## 2018-06-03 DIAGNOSIS — M2550 Pain in unspecified joint: Secondary | ICD-10-CM | POA: Diagnosis not present

## 2018-06-03 DIAGNOSIS — R112 Nausea with vomiting, unspecified: Secondary | ICD-10-CM | POA: Diagnosis present

## 2018-06-03 DIAGNOSIS — E875 Hyperkalemia: Secondary | ICD-10-CM | POA: Diagnosis not present

## 2018-06-03 DIAGNOSIS — F0151 Vascular dementia with behavioral disturbance: Secondary | ICD-10-CM | POA: Diagnosis not present

## 2018-06-03 DIAGNOSIS — Z66 Do not resuscitate: Secondary | ICD-10-CM | POA: Diagnosis not present

## 2018-06-03 DIAGNOSIS — F05 Delirium due to known physiological condition: Secondary | ICD-10-CM | POA: Diagnosis not present

## 2018-06-03 DIAGNOSIS — I10 Essential (primary) hypertension: Secondary | ICD-10-CM | POA: Diagnosis not present

## 2018-06-03 LAB — COMPREHENSIVE METABOLIC PANEL
ALT: 13 U/L (ref 0–44)
AST: 18 U/L (ref 15–41)
Albumin: 3.1 g/dL — ABNORMAL LOW (ref 3.5–5.0)
Alkaline Phosphatase: 39 U/L (ref 38–126)
Anion gap: 8 (ref 5–15)
BUN: 14 mg/dL (ref 8–23)
CO2: 22 mmol/L (ref 22–32)
Calcium: 7.5 mg/dL — ABNORMAL LOW (ref 8.9–10.3)
Chloride: 108 mmol/L (ref 98–111)
Creatinine, Ser: 0.79 mg/dL (ref 0.44–1.00)
GFR calc Af Amer: >60 mL/min (ref >60)
GFR calc non Af Amer: >60 mL/min (ref >60)
Glucose, Bld: 101 mg/dL — ABNORMAL HIGH (ref 70–99)
Potassium: 3.4 mmol/L — ABNORMAL LOW (ref 3.5–5.1)
Sodium: 138 mmol/L (ref 135–145)
Total Bilirubin: 1 mg/dL (ref 0.3–1.2)
Total Protein: 5.7 g/dL — ABNORMAL LOW (ref 6.5–8.1)

## 2018-06-03 LAB — CBC
HCT: 39.2 % (ref 36.0–46.0)
HEMOGLOBIN: 12.4 g/dL (ref 12.0–15.0)
MCH: 30.9 pg (ref 26.0–34.0)
MCHC: 31.6 g/dL (ref 30.0–36.0)
MCV: 97.8 fL (ref 80.0–100.0)
Platelets: 216 10*3/uL (ref 150–400)
RBC: 4.01 MIL/uL (ref 3.87–5.11)
RDW: 17.5 % — ABNORMAL HIGH (ref 11.5–15.5)
WBC: 10.5 10*3/uL (ref 4.0–10.5)
nRBC: 0 % (ref 0.0–0.2)

## 2018-06-03 LAB — MAGNESIUM: Magnesium: 2 mg/dL (ref 1.7–2.4)

## 2018-06-03 LAB — TROPONIN I
Troponin I: 0.03 ng/mL
Troponin I: 0.03 ng/mL
Troponin I: 0.05 ng/mL

## 2018-06-03 LAB — ECHOCARDIOGRAM COMPLETE
Height: 59 in
WEIGHTICAEL: 1920 [oz_av]

## 2018-06-03 LAB — LACTIC ACID, PLASMA
Lactic Acid, Venous: 2.4 mmol/L (ref 0.5–1.9)
Lactic Acid, Venous: 1.1 mmol/L (ref 0.5–1.9)

## 2018-06-03 LAB — PROCALCITONIN: Procalcitonin: 0.12 ng/mL

## 2018-06-03 LAB — BRAIN NATRIURETIC PEPTIDE: B Natriuretic Peptide: 267.8 pg/mL — ABNORMAL HIGH (ref 0.0–100.0)

## 2018-06-03 LAB — MRSA PCR SCREENING: MRSA by PCR: NEGATIVE

## 2018-06-03 LAB — PHOSPHORUS: PHOSPHORUS: 3 mg/dL (ref 2.5–4.6)

## 2018-06-03 LAB — TSH: TSH: 1.614 u[IU]/mL (ref 0.350–4.500)

## 2018-06-03 MED ORDER — LORAZEPAM 2 MG/ML IJ SOLN
0.5000 mg | Freq: Once | INTRAMUSCULAR | Status: AC
  Administered 2018-06-03: 0.5 mg via INTRAVENOUS
  Filled 2018-06-03: qty 1

## 2018-06-03 MED ORDER — SENNOSIDES-DOCUSATE SODIUM 8.6-50 MG PO TABS
1.0000 | ORAL_TABLET | Freq: Two times a day (BID) | ORAL | Status: DC
  Administered 2018-06-05 – 2018-06-07 (×5): 1 via ORAL
  Filled 2018-06-03 (×6): qty 1

## 2018-06-03 MED ORDER — HALOPERIDOL LACTATE 5 MG/ML IJ SOLN
INTRAMUSCULAR | Status: AC
  Filled 2018-06-03: qty 1

## 2018-06-03 MED ORDER — SODIUM CHLORIDE 0.9 % IV SOLN: INTRAVENOUS | Status: AC

## 2018-06-03 MED ORDER — HALOPERIDOL LACTATE 5 MG/ML IJ SOLN: 2.0000 mg | Freq: Once | INTRAMUSCULAR | Status: DC

## 2018-06-03 MED ORDER — OLANZAPINE 5 MG PO TABS
2.5000 mg | ORAL_TABLET | Freq: Every day | ORAL | Status: DC
  Administered 2018-06-03: 2.5 mg via ORAL
  Filled 2018-06-03: qty 1

## 2018-06-03 MED ORDER — SODIUM CHLORIDE 0.9 % IV SOLN
INTRAVENOUS | Status: DC
  Administered 2018-06-03: 16:00:00 via INTRAVENOUS

## 2018-06-03 MED ORDER — METOCLOPRAMIDE HCL 5 MG/ML IJ SOLN: 5.0000 mg | Freq: Four times a day (QID) | INTRAMUSCULAR | Status: DC | PRN

## 2018-06-03 NOTE — Evaluation (Signed)
Physical Therapy Evaluation Patient Details Name: Alice Hayes MRN: [id number] DOB: 06/02/1922 Today's Date: 06/03/2018   History of Present Illness  85 y.o. female with medical history significant of HL,; HTN, diastolic heart failure, COPD, CAD, dementia, hypothyroidism and admitted for questionable Pyelonephritis vs Pyuria    Clinical Impression  Pt admitted with above diagnosis. Pt currently with functional limitations due to the deficits listed below (see PT Problem List). Pt will benefit from skilled PT to increase their independence and safety with mobility to allow discharge to the venue listed below.   Pt with hx of dementia and admitted from memory care unit.  Pt currently requiring min-mod assist for mobility.  IF ALF unable to provide current level of care, pt may need SNF.     Follow Up Recommendations Home health PT;Supervision/Assistance - 24 hour(if ALF able to provide current assist, if not then SNF)    Equipment Recommendations  None recommended by PT    Recommendations for Other Services       Precautions / Restrictions Precautions Precautions: Fall Precaution Comments: HOH Restrictions Weight Bearing Restrictions: No      Mobility  Bed Mobility Overal bed mobility: Needs Assistance Bed Mobility: Supine to Sit     Supine to sit: Mod assist;HOB elevated     General bed mobility comments: assist to powerup trunk (per OT)  Transfers Overall transfer level: Needs assistance Equipment used: Rolling walker (2 wheeled) Transfers: Sit to/from [facility] Sit to Stand: Mod assist;Min assist Stand pivot transfers: Min assist;Mod assist       General transfer comment: from EOB>BSC>recliner. assist to powerup and steady on initial stand.  less assist needed second time standing.  assist for manuevering RW with turning  Ambulation/Gait                Stairs            Wheelchair Mobility    Modified Rankin (Stroke  Patients Only)       Balance Overall balance assessment: Needs assistance;History of Falls Sitting-balance support: Bilateral upper extremity supported;Feet supported Sitting balance-Leahy Scale: Fair     Standing balance support: Bilateral upper extremity supported;During functional activity Standing balance-Leahy Scale: Poor Standing balance comment: rw and some steadying assist                              Pertinent Vitals/Pain Pain Assessment: Faces Faces Pain Scale: Hurts little more Pain Location: abdomen Pain Descriptors / Indicators: Sore Pain Intervention(s): Monitored during session;Repositioned    Home Living Family/patient expects to be discharged to:: Assisted living               Home Equipment: Walker - 2 wheels Additional Comments: now resides in memory care unit    Prior Function Level of Independence: Needs assistance   Gait / Transfers Assistance Needed: facility distances with RW prior to last admission 6 months ago  ADL's / Homemaking Assistance Needed: some assistance needed  Comments: RW at baseline, unclear how much walking she does vs. transferring to w/c?     Hand Dominance        Extremity/Trunk Assessment   Upper Extremity Assessment Upper Extremity Assessment: Generalized weakness    Lower Extremity Assessment Lower Extremity Assessment: Generalized weakness    Cervical / Trunk Assessment Cervical / Trunk Assessment: Kyphotic  Communication   Communication: HOH  Cognition Arousal/Alertness: Awake/alert Behavior During Therapy: WFL for tasks assessed/performed;Anxious Overall Cognitive  Status: History of cognitive impairments - at baseline                                 General Comments: occasionally a little anxious however improves with step by step instruction prior to performing; hx of dementia      General Comments General comments (skin integrity, edema, etc.): maintained 2L O2 [place]     Exercises     Assessment/Plan    PT Assessment Patient needs continued PT services  PT Problem List Decreased strength;Decreased mobility;Decreased activity tolerance;Decreased balance;Decreased knowledge of use of DME       PT Treatment Interventions DME instruction;Functional mobility training;Gait training;Therapeutic activities;Stair training;Therapeutic exercise;Balance training;Patient/family education    PT Goals (Current goals can be found in the Care Plan section)  Acute Rehab PT Goals Patient Stated Goal: not stated PT Goal Formulation: Patient unable to participate in goal setting Time For Goal Achievement: 06/17/18 Potential to Achieve Goals: Good    Frequency Min 2X/week   Barriers to discharge        Co-evaluation PT/OT/SLP Co-Evaluation/Treatment: Yes Reason for Co-Treatment: For patient/therapist safety;To address functional/ADL transfers PT goals addressed during session: Mobility/safety with mobility OT goals addressed during session: ADL's and self-care       AM-PAC PT "6 Clicks" Mobility  Outcome Measure Help needed turning from your back to your side while in a flat bed without using bedrails?: A Little Help needed moving from lying on your back to sitting on the side of a flat bed without using bedrails?: A Lot Help needed moving to and from a bed to a chair (including a wheelchair)?: A Lot Help needed standing up from a chair using your arms (e.g., wheelchair or bedside chair)?: A Lot Help needed to walk in hospital room?: A Lot Help needed climbing 3-5 steps with a railing? : Total 6 Click Score: 12    End of Session Equipment Utilized During Treatment: Gait belt;Oxygen Activity Tolerance: Patient tolerated treatment well Patient left: in chair;with call bell/phone within reach;with chair alarm set Nurse Communication: Mobility status PT Visit Diagnosis: Other abnormalities of gait and mobility (R26.89)    Time: 0160-1093 PT Time  Calculation (min) (ACUTE ONLY): 12 min   Charges:   PT Evaluation $PT Eval Low Complexity: [place], PT, DPT Acute Rehabilitation Services Office: [phone] Pager: [phone]   Trena Platt 06/03/2018, 1:03 PM

## 2018-06-03 NOTE — Progress Notes (Signed)
Spoke with Shelia in lab, pt has urine specimen in lab to run urine cultrue sample. Will cont to monitor. SRP, RN

## 2018-06-03 NOTE — Progress Notes (Signed)
PROGRESS NOTE  Alice Hayes MCN:[id number] DOB: 05-Jul-1922 DOA: 06/02/2018 PCP: Vernie Shanks, MD  HPI/Recap of past 24 hours:  Confused, does not follow commands, not able to provide history. poor oral intake, dehydrated  Daughter at bedside Daughter is concerned about patient 's confusion and sundowning which is not her baseline   Assessment/Plan: Active Problems:   Hypothyroidism   Dementia (HCC)   Hyperlipidemia   Hypertension   Sepsis ([place])   Pyelonephritis   Dehydration   Intractable nausea and vomiting   UTI/pyelonephritis/n/v -patient reports new odor In the urine to family prior to coming to the hospital -urine is dark /cloudy, she has leukocytosis/alctic acidosis on presentation, kub "Nonobstructed bowel-gas pattern" -blood culture no growth, urine culture in process, mrsa screening negative ,continue rocephin for now  Large pericardiAL effusion: Findings are concerning for increased intrapericardial   pressure.  -bnp 267, troponin 0.03-0.03-0.05 -She does not appear to have chest pain, although she can not provide reliable history -bp stable,  -CASE Discussed with cardiology  Who is going to see patient in consult  Hyperkalemia:  On presentation, normalized.  COPD; continue home mebs No cough  Dementia from a memory care with delirium /sun Contractor, d/c tele, bladder scan, start stool softener to prevent constipation Start low dose zyprexa, monitor effect  Hypothyroidism: continue home meds thyroids supplement  H/o frequent falls, had pubic ramus fracture in 11/2017 due to falls, she walks with a walker, from a memory care units, family prefer her go back to memory care units, will likely need home health.   Code Status: DNR  Family Communication: patient and two daughters at bedside  Disposition Plan: DNR   Consultants:  cardiology  Procedures:  none  Antibiotics:  Rocephin    Objective: BP (!) 141/57 (BP  Location: Left Arm)   Pulse 82   Temp 98.8 F (37.1 C) (Oral)   Resp 18   Ht 4\' 11"  (1.499 m)   Wt 54.4 kg   SpO2 95%   BMI 24.24 kg/m   Intake/Output Summary (Last 24 hours) at 06/03/2018 1000 Last data filed at 06/03/2018 0200 Gross per 24 hour  Intake 2182.9 ml  Output -  Net 2182.9 ml   Filed Weights   06/02/18 1703  Weight: 54.4 kg    Exam: Patient is examined daily including today on 06/03/2018, exams remain the same as of yesterday except that has changed    General:  Confused, only oriented to self  Cardiovascular: RRR  Respiratory: diminished at basis  Abdomen: Soft/ND/NT, positive BS  Musculoskeletal: No Edema  Neuro: alert, oriented to self only,   Data Reviewed: Basic Metabolic Panel: Recent Labs  Lab 06/02/18 1720 06/03/18 0602  NA 139 138  K 5.5* 3.4*  CL 103 108  CO2 24 22  GLUCOSE 145* 101*  BUN 19 14  CREATININE 0.94 0.79  CALCIUM 8.8* 7.5*  MG  --  2.0  PHOS  --  3.0   Liver Function Tests: Recent Labs  Lab 06/02/18 1720 06/03/18 0602  AST 38 18  ALT 16 13  ALKPHOS 49 39  BILITOT 1.6* 1.0  PROT 6.5 5.7*  ALBUMIN 3.9 3.1*   Recent Labs  Lab 06/02/18 1720  LIPASE 33   No results for input(s): AMMONIA in the last 168 hours. CBC: Recent Labs  Lab 06/02/18 1720 06/03/18 0602  WBC 18.1* 10.5  HGB 15.2* 12.4  HCT 47.1* 39.2  MCV 96.9 97.8  PLT 265 216  Cardiac Enzymes:   Recent Labs  Lab 06/03/18 0020 06/03/18 0602  TROPONINI 0.03* 0.03*   BNP (last 3 results) Recent Labs    06/03/18 0020  BNP 267.8*    ProBNP (last 3 results) No results for input(s): PROBNP in the last 8760 hours.  CBG: No results for input(s): GLUCAP in the last 168 hours.  Recent Results (from the past 240 hour(s))  Culture, blood (x 2)     Status: None (Preliminary result)   Collection Time: 06/03/18 12:20 AM  Result Value Ref Range Status   Specimen Description   Final    BLOOD LEFT ARM Performed at [facility], [address], [place], [place] [address]    Special Requests   Final    BOTTLES DRAWN AEROBIC AND ANAEROBIC Blood Culture adequate volume Performed at [facility] [address], [place], [place] [address]    Culture   Final    NO GROWTH < 12 HOURS Performed at [facility] [address], [place], [place] [address]    Report Status PENDING  Incomplete  Culture, blood (x 2)     Status: None (Preliminary result)   Collection Time: 06/03/18 12:20 AM  Result Value Ref Range Status   Specimen Description   Final    BLOOD RIGHT HAND Performed at [facility], [place] [address] [place], [place] [address]    Special Requests   Final    BOTTLES DRAWN AEROBIC AND ANAEROBIC Blood Culture adequate volume Performed at [facility] [address], [place], [place] [address]    Culture   Final    NO GROWTH < 12 HOURS Performed at [facility] [address] [place], [place] [address]    Report Status PENDING  Incomplete     Studies: Dg Acute Abd 2+v Abd (supine,erect,decub) + 1v Chest  Result Date: 06/02/2018 CLINICAL DATA:  Vomiting EXAM: DG ABDOMEN ACUTE W/ 1V CHEST COMPARISON:  12/02/2017, chest x-ray 04/06/2015 FINDINGS: Single view chest demonstrates cardiomegaly with vascular congestion. Probable small pleural effusions. Aortic atherosclerosis. No pneumothorax. Stable sclerotic lesion in the proximal left humerus. Supine and upright views of the abdomen demonstrate no free air beneath the diaphragm. Surgical clips in the right upper quadrant. Nonobstructed bowel-gas pattern. Extensive vascular calcifications. IMPRESSION: 1. Cardiomegaly with vascular congestion and probable trace pleural effusion 2. Nonobstructed bowel-gas pattern Electronically Signed   By: Donavan Foil M.D.   On: 06/02/2018 21:15    Scheduled Meds: . aspirin  81 mg Oral BID  . citalopram  20 mg Oral Daily  . enoxaparin (LOVENOX) injection  30 mg Subcutaneous QHS  .  ipratropium-albuterol  3 mL Nebulization BID  . thyroid  60 mg Oral Daily    Continuous Infusions: . sodium chloride    . cefTRIAXone (ROCEPHIN)  IV       Time spent: 52mins I have personally reviewed and interpreted on  06/03/2018 daily labs, tele strips, imagings as discussed above under date review session and assessment and plans.  I reviewed all nursing notes, pharmacy notes, vitals, pertinent old records  I have discussed plan of care as described above with RN , patient and family on 06/03/2018   Florencia Reasons MD, PhD  [facility] Pager [phone]. If 7PM-7AM, please contact night-coverage at [email or web address], password [email or web address] 06/03/2018, 10:00 AM  LOS: 0 days

## 2018-06-03 NOTE — Evaluation (Signed)
Occupational Therapy Evaluation Patient Details Name: Alice Hayes MRN: [id number] DOB: [birth year] Today's Date: 06/03/2018    History of Present Illness 85 y.o. female with medical history significant of HLD; HTN; diastolic heart failure; COPD; CAD; and dementia hypothyroidism   Clinical Impression   Pt admitted with the above diagnoses and presents with below problem list. Pt will benefit from continued acute OT to address the below listed deficits and maximize independence with basic ADLs prior to d/c back to SNF. Per chart review, PTA pt was needing assistance with ADLs. Pt is currently min to mod A with stand-pivot transfer to [facility] for toileting, max A for UB/LB ADLs, max A +2 for safety for pericare in sit<>stand. No family present on eval.     Follow Up Recommendations  SNF;Supervision/Assistance - 24 hour    Equipment Recommendations  None recommended by OT    Recommendations for Other Services       Precautions / Restrictions Precautions Precautions: Fall Precaution Comments: HOH Restrictions Weight Bearing Restrictions: No      Mobility Bed Mobility Overal bed mobility: Needs Assistance Bed Mobility: Supine to Sit     Supine to sit: Mod assist;HOB elevated     General bed mobility comments: assist to powerup trunk   Transfers Overall transfer level: Needs assistance Equipment used: Rolling walker (2 wheeled) Transfers: Sit to/from [facility] Sit to Stand: Mod assist;Min assist Stand pivot transfers: Min assist;Mod assist       General transfer comment: from EOB>BSC>recliner. assist to powerup and steady on initial stand.  less assist needed second time standing.    Balance Overall balance assessment: Needs assistance Sitting-balance support: Bilateral upper extremity supported;Feet supported Sitting balance-Leahy Scale: Fair     Standing balance support: Bilateral upper extremity supported;During functional activity Standing  balance-Leahy Scale: Poor Standing balance comment: rw and some steadying assist                            ADL either performed or assessed with clinical judgement   ADL Overall ADL's : Needs assistance/impaired Eating/Feeding: Set up;Sitting Eating/Feeding Details (indicate cue type and reason): pt drinking from a cup during session Grooming: Maximal assistance;Sitting   Upper Body Bathing: Maximal assistance;Sitting   Lower Body Bathing: Maximal assistance   Upper Body Dressing : Maximal assistance   Lower Body Dressing: Maximal assistance   Toilet Transfer: Moderate assistance;Stand-pivot;RW   Toileting- Clothing Manipulation and Hygiene: Moderate assistance;Sit to/from stand;+2 for safety/equipment;Maximal assistance Toileting - Clothing Manipulation Details (indicate cue type and reason): +2 helpful Tub/ Shower Transfer: Moderate assistance;Stand-pivot;3 in 1;Rolling walker     General ADL Comments: Pt completed bed mobility, pivotal steps BSC then to recliner, pt stood with rw and some assist for balance and for pericare     Vision         Perception     Praxis      Pertinent Vitals/Pain Pain Assessment: Faces Faces Pain Scale: Hurts little more Pain Location: abdomen Pain Descriptors / Indicators: Sore Pain Intervention(s): Monitored during session;Repositioned;Limited activity within patient's tolerance     Hand Dominance     Extremity/Trunk Assessment Upper Extremity Assessment Upper Extremity Assessment: Generalized weakness   Lower Extremity Assessment Lower Extremity Assessment: Defer to PT evaluation   Cervical / Trunk Assessment Cervical / Trunk Assessment: Kyphotic   Communication Communication Communication: HOH   Cognition Arousal/Alertness: Awake/alert Behavior During Therapy: WFL for tasks assessed/performed;Anxious;Flat affect(at times anxious) Overall Cognitive Status:  History of cognitive impairments - at baseline                                  General Comments: per chart review recognizes family but needs assist with ADLs   General Comments       Exercises     Shoulder Instructions      Home Living Family/patient expects to be discharged to:: Assisted living                             Home Equipment: Walker - 2 wheels   Additional Comments: now resides in memory care unit      Prior Functioning/Environment Level of Independence: Needs assistance        Comments: rw at baseline, unclear how much walking she does vs. transferring to w/c?        OT Problem List: Decreased activity tolerance;Impaired balance (sitting and/or standing);Decreased cognition;Decreased safety awareness;Decreased knowledge of use of DME or AE;Decreased knowledge of precautions;Cardiopulmonary status limiting activity;Pain      OT Treatment/Interventions: Self-care/ADL training;Therapeutic exercise;DME and/or AE instruction;Therapeutic activities;Patient/family education;Balance training    OT Goals(Current goals can be found in the care plan section) Acute Rehab OT Goals Patient Stated Goal: not stated Time For Goal Achievement: 06/17/18 Potential to Achieve Goals: Good ADL Goals Pt Will Perform Upper Body Bathing: with mod assist;sitting Pt Will Perform Lower Body Bathing: with mod assist;sit to/from stand Pt Will Transfer to Toilet: with min assist;stand pivot transfer Pt Will Perform Toileting - Clothing Manipulation and hygiene: with mod assist;sit to/from stand Additional ADL Goal #1: Pt will complete bed mobility with min guard assist to prepare for EOB/OOB ADLs.  OT Frequency: Min 2X/week   Barriers to D/C:            Co-evaluation PT/OT/SLP Co-Evaluation/Treatment: Yes Reason for Co-Treatment: For patient/therapist safety;To address functional/ADL transfers;Necessary to address cognition/behavior during functional activity   OT goals addressed during session: ADL's and  self-care      AM-PAC OT "6 Clicks" Daily Activity     Outcome Measure Help from another person eating meals?: A Little Help from another person taking care of personal grooming?: A Lot Help from another person toileting, which includes using toliet, bedpan, or urinal?: A Lot Help from another person bathing (including washing, rinsing, drying)?: A Lot Help from another person to put on and taking off regular upper body clothing?: A Lot Help from another person to put on and taking off regular lower body clothing?: A Lot 6 Click Score: 13   End of Session Equipment Utilized During Treatment: Rolling walker;Oxygen Nurse Communication: Other (comment);Mobility status(NT)  Activity Tolerance: Patient limited by fatigue Patient left: in chair;with call bell/phone within reach;with chair alarm set  OT Visit Diagnosis: Unsteadiness on feet (R26.81);Muscle weakness (generalized) (M62.81);Other symptoms and signs involving cognitive function;Pain                Time: 1010-1046 OT Time Calculation (min): 36 min Charges:  OT General Charges $OT Visit: 1 Visit OT Evaluation $OT Eval Low Complexity: [place], OT Acute Rehabilitation Services Pager: [phone] Office: [phone]   Hortencia Pilar 06/03/2018, 11:12 AM

## 2018-06-03 NOTE — Progress Notes (Signed)
  Echocardiogram 2D Echocardiogram has been performed.  Jennette Dubin 06/03/2018, 12:19 PM

## 2018-06-03 NOTE — Progress Notes (Signed)
CRITICAL VALUE ALERT  Critical Value: Lactic 2.4  Date & Time Notied:  06/03/2018, 0110  Provider Notified: Lamar Blinks NP   Orders Received/Actions taken: No new orders at this time

## 2018-06-03 NOTE — Progress Notes (Signed)
Troponin 0.05 MD updated. SRP, RN

## 2018-06-03 NOTE — Progress Notes (Signed)
Scheduled EKG completed. Reads critical EKG long QTc. NP on call notified to review. Pt not symptomatic at this time. Will continue to monitor.

## 2018-06-03 NOTE — Progress Notes (Signed)
Pt restless and attempting to get out of bed, family at bedside and states pt may need med for relaxation. SRP, RN

## 2018-06-03 NOTE — Care Management Obs Status (Signed)
[place] NOTIFICATION   Patient Details  Name: KARISMA MEISER MRN: [id number] Date of Birth: [birth year]   Medicare Observation Status Notification Given:  Yes    Purcell Mouton, RN 06/03/2018, 4:39 PM

## 2018-06-03 NOTE — Progress Notes (Signed)
CRITICAL VALUE ALERT  Critical Value:  Troponin 0.03  Date & Time Notied:  06/03/2018; 0115  Provider Notified: Lamar Blinks, NP  Orders Received/Actions taken: No new orders received at this time.

## 2018-06-04 DIAGNOSIS — I313 Pericardial effusion (noninflammatory): Secondary | ICD-10-CM

## 2018-06-04 DIAGNOSIS — E870 Hyperosmolality and hypernatremia: Secondary | ICD-10-CM

## 2018-06-04 DIAGNOSIS — F0151 Vascular dementia with behavioral disturbance: Secondary | ICD-10-CM

## 2018-06-04 LAB — CBC WITH DIFFERENTIAL/PLATELET
Abs Immature Granulocytes: 0.02 10*3/uL (ref 0.00–0.07)
BASOS PCT: 0 %
Basophils Absolute: 0 10*3/uL (ref 0.0–0.1)
EOS PCT: 3 %
Eosinophils Absolute: 0.2 10*3/uL (ref 0.0–0.5)
HEMATOCRIT: 38.1 % (ref 36.0–46.0)
Hemoglobin: 11.6 g/dL — ABNORMAL LOW (ref 12.0–15.0)
Immature Granulocytes: 0 %
Lymphocytes Relative: 9 %
Lymphs Abs: 0.7 10*3/uL (ref 0.7–4.0)
MCH: 30.3 pg (ref 26.0–34.0)
MCHC: 30.4 g/dL (ref 30.0–36.0)
MCV: 99.5 fL (ref 80.0–100.0)
Monocytes Absolute: 0.6 10*3/uL (ref 0.1–1.0)
Monocytes Relative: 9 %
NEUTROS PCT: 79 %
Neutro Abs: 5.9 10*3/uL (ref 1.7–7.7)
Platelets: 201 10*3/uL (ref 150–400)
RBC: 3.83 MIL/uL — ABNORMAL LOW (ref 3.87–5.11)
RDW: 17.7 % — ABNORMAL HIGH (ref 11.5–15.5)
WBC: 7.5 10*3/uL (ref 4.0–10.5)
nRBC: 0 % (ref 0.0–0.2)

## 2018-06-04 LAB — BASIC METABOLIC PANEL
Anion gap: 7 (ref 5–15)
BUN: 9 mg/dL (ref 8–23)
CO2: 28 mmol/L (ref 22–32)
Calcium: 7.7 mg/dL — ABNORMAL LOW (ref 8.9–10.3)
Chloride: 113 mmol/L — ABNORMAL HIGH (ref 98–111)
Creatinine, Ser: 0.69 mg/dL (ref 0.44–1.00)
GFR calc Af Amer: >60 mL/min (ref >60)
GFR calc non Af Amer: >60 mL/min (ref >60)
Glucose, Bld: 95 mg/dL (ref 70–99)
Potassium: 3.6 mmol/L (ref 3.5–5.1)
Sodium: 148 mmol/L — ABNORMAL HIGH (ref 135–145)

## 2018-06-04 LAB — MAGNESIUM: Magnesium: 2.2 mg/dL (ref 1.7–2.4)

## 2018-06-04 MED ORDER — LORAZEPAM 2 MG/ML IJ SOLN
0.5000 mg | Freq: Once | INTRAMUSCULAR | Status: AC
  Administered 2018-06-04: 0.5 mg via INTRAVENOUS
  Filled 2018-06-04: qty 1

## 2018-06-04 MED ORDER — SODIUM CHLORIDE 0.9 % IV SOLN
INTRAVENOUS | Status: DC
  Administered 2018-06-04: 06:00:00 via INTRAVENOUS

## 2018-06-04 MED ORDER — LORAZEPAM 2 MG/ML IJ SOLN
0.5000 mg | Freq: Three times a day (TID) | INTRAMUSCULAR | Status: DC | PRN
  Administered 2018-06-05 (×3): 0.5 mg via INTRAVENOUS
  Filled 2018-06-04 (×3): qty 1

## 2018-06-04 MED ORDER — DEXTROSE-NACL 5-0.45 % IV SOLN
INTRAVENOUS | Status: AC
  Administered 2018-06-04: 11:00:00 via INTRAVENOUS

## 2018-06-04 NOTE — Progress Notes (Addendum)
Patient became aggressive and violent towards myself and tech. Patient began to punch, kick, and pinch even after several attempted to reorient. Paged on call provider. Provider wrote order for Ativan. Patient has calmed down and went to sleep since receiving ativan. Will continue to monitor closely.

## 2018-06-04 NOTE — Consult Note (Signed)
Cardiology Consultation:   Patient ID: NYJAE HODGE MRN: [id number]; DOB: 08-12-1922  Admit date: 06/02/2018 Date of Consult: 06/04/2018  Primary Care Provider: Vernie Shanks, MD Primary Cardiologist: New--Dr. Buford Dresser   Patient Profile:   Alice Hayes is a 85 y.o. female with a hx of dementia, noted COPD, diastolic HR, HLD, HTN who is being seen today for the evaluation of pericardial effusion at the request of Dr. Erlinda Hong.  History of Present Illness:   [name] is alone today on my interview and can give no history. History obtained from the medical records. She presented with nausea/vomiting and fatigue. Workup concerning for UTI vs. Pyelonephritis. Per H&P notes, family wished to avoid any aggressive interventions. I attempted to contact Alice Hayes, daughter, listed in the contact information but had to leave a voicemail at the listed mobile number.  Past Medical History:  Diagnosis Date  . CAD (coronary artery disease)    nonobstructive cath 2006  . Cholelithiases   . COPD (chronic obstructive pulmonary disease) ([place])   . Dementia ([place])   . Diastolic heart failure ([place])   . HTN (hypertension)   . Hyperlipidemia   . Melanoma ([place])   . Osteoarthritis   . Osteoporosis   . Pubic ramus fracture ([place]) 12/01/2017    Past Surgical History:  Procedure Laterality Date  . APPENDECTOMY    . BLADDER SUSPENSION     2006  . BREAST LUMPECTOMY    . CARPAL TUNNEL RELEASE     2007  . CATARACT EXTRACTION    . CHOLECYSTECTOMY    . LYMPH NODE BIOPSY    . MELANOMA EXCISION     2008  . PAROTIDECTOMY     1959  . TONSILLECTOMY AND ADENOIDECTOMY       Home Medications:  Prior to Admission medications   Medication Sig Start Date End Date Taking? Authorizing Provider  aspirin 81 MG chewable tablet Chew 81 mg by mouth 2 (two) times daily.   Yes [provider]  bisacodyl (DULCOLAX) 5 MG EC tablet Take 1 tablet (5 mg total) by mouth daily as  needed for moderate constipation. 12/03/17  Yes Regalado, Belkys A, MD  Calcium Carb-Cholecalciferol (CALCIUM 600+D3) 600-200 MG-UNIT TABS Take 1 tablet by mouth daily.   Yes [provider]  Cholecalciferol (VITAMIN D-3) 1000 units CAPS Take 1,000 Units by mouth daily.   Yes [provider]  citalopram (CELEXA) 20 MG tablet Take 20 mg by mouth daily.   Yes [provider]  Coenzyme Q10 (CO Q 10) 10 MG CAPS Take 10 mg by mouth daily.   Yes [provider]  HYDROcodone-acetaminophen (NORCO) 5-325 MG tablet Take 0.5 tablets by mouth every 6 (six) hours as needed for moderate pain or severe pain. 12/03/17  Yes Regalado, Belkys A, MD  ipratropium-albuterol (DUONEB) 0.5-2.5 (3) MG/3ML SOLN Take 3 mLs by nebulization 2 (two) times daily. 12/03/17  Yes Regalado, Belkys A, MD  OVER THE COUNTER MEDICATION "ICAPS Multivitamin + Lutein tablets: Take 1 tablet by mouth once a day   Yes [provider]  polyethylene glycol (MIRALAX / GLYCOLAX) packet Take 17 g by mouth daily. 12/03/17  Yes Regalado, Belkys A, MD  senna-docusate (SENOKOT-S) 8.6-50 MG tablet Take 1 tablet by mouth at bedtime. 12/03/17  Yes Regalado, Belkys A, MD  thyroid (NP THYROID) 60 MG tablet Take 60 mg by mouth daily.   Yes [provider]  enoxaparin (LOVENOX) 30 MG/0.3ML injection Inject 0.3 mLs (30 mg total)  into the skin daily. Patient not taking: Reported on 06/02/2018 12/03/17   Niel Hummer A, MD    Inpatient Medications: Scheduled Meds: . aspirin  81 mg Oral BID  . citalopram  20 mg Oral Daily  . enoxaparin (LOVENOX) injection  30 mg Subcutaneous QHS  . ipratropium-albuterol  3 mL Nebulization BID  . OLANZapine  2.5 mg Oral QHS  . senna-docusate  1 tablet Oral BID  . thyroid  60 mg Oral Daily   Continuous Infusions: . cefTRIAXone (ROCEPHIN)  IV 1 g (06/03/18 2211)  . dextrose 5 % and 0.45% NaCl 50 mL/hr at 06/04/18 1128   PRN Meds: acetaminophen **OR** acetaminophen,  HYDROcodone-acetaminophen, metoCLOPramide (REGLAN) injection, ondansetron **OR** ondansetron (ZOFRAN) IV  Allergies:    Allergies  Allergen Reactions  . Voltaren Ophthalmic [Diclofenac] Other (See Comments)    Reaction not recalled by family ??    Social History:   Social History   Socioeconomic History  . Marital status: Widowed    Spouse name: Not on file  . Number of children: Not on file  . Years of education: Not on file  . Highest education level: Not on file  Occupational History  . Not on file  Social Needs  . Financial resource strain: Not on file  . Food insecurity:    Worry: Not on file    Inability: Not on file  . Transportation needs:    Medical: Not on file    Non-medical: Not on file  Tobacco Use  . Smoking status: Never Smoker  . Smokeless tobacco: Never Used  Substance and Sexual Activity  . Alcohol use: Never    Frequency: Never  . Drug use: Never  . Sexual activity: Not Currently  Lifestyle  . Physical activity:    Days per week: Not on file    Minutes per session: Not on file  . Stress: Not on file  Relationships  . Social connections:    Talks on phone: Not on file    Gets together: Not on file    Attends religious service: Not on file    Active member of club or organization: Not on file    Attends meetings of clubs or organizations: Not on file    Relationship status: Not on file  . Intimate partner violence:    Fear of current or ex partner: Not on file    Emotionally abused: Not on file    Physically abused: Not on file    Forced sexual activity: Not on file  Other Topics Concern  . Not on file  Social History Narrative   Lives in memory care center    Family History:    Family History  Problem Relation Age of Onset  . CAD Father        Sudden death age 19  . CAD Son 20       MI.  Alive in his 54s  . Cancer Daughter        Breast  . Pancreatitis Daughter        Severe episode     ROS:  Please see the history of present  illness.  Unable to obtain ROS due to mental status  Physical Exam/Data:   Vitals:   06/03/18 1358 06/03/18 1617 06/03/18 2000 06/04/18 0654  BP: 139/77 120/90  120/69  Pulse: 67 80  86  Resp: 18 18  (!) 22  Temp: 98.1 F (36.7 C) 99.4 F (37.4 C)  99.3 F (37.4 C)  TempSrc:  Oral Oral  Oral  SpO2: 97% 95% 95% 92%  Weight:      Height:        Intake/Output Summary (Last 24 hours) at 06/04/2018 1148 Last data filed at 06/04/2018 0600 Gross per 24 hour  Intake 1191.14 ml  Output 2050 ml  Net -858.86 ml   Filed Weights   06/02/18 1703  Weight: 54.4 kg   Body mass index is 24.24 kg/m.  General:  Well nourished, well developed, in no acute distress. Lying flat in bed, [place] in place HEENT: normal Lymph: no adenopathy Neck: JVD to upper neck lying nearly flat Endocrine:  No thryomegaly Vascular: No carotid bruits; RA pulses 2+ bilaterally  Cardiac:  Distant heart sounds. normal S1, S2; RRR; no murmur Lungs:  clear to auscultation bilaterally upper fields, diminished at bases Abd: soft, nontender, no hepatomegaly  Ext: no edema Musculoskeletal:  No deformities, BUE and BLE strength normal and equal Skin: warm and dry  Neuro:  Moves all 4 limbs independently Psych:  Not oriented. Follows basic commands  EKG:  The EKG was personally reviewed and demonstrates:  SR, frequent PVCs Telemetry:  Not on telemetry  Relevant CV Studies:  - Left ventricle: The cavity size was normal. There was mild focal   basal hypertrophy of the septum. Systolic function was normal.   The estimated ejection fraction was in the range of 60% to 65%.   Wall motion was normal; there were no regional wall motion   abnormalities. Doppler parameters are consistent with abnormal   left ventricular relaxation (grade 1 diastolic dysfunction).   Doppler parameters are consistent with high ventricular filling   pressure. - Aortic valve: Trileaflet; moderately thickened, moderately   calcified leaflets.  Valve mobility was restricted. There was very   mild stenosis. There was trivial regurgitation. Peak velocity   (S): 204 cm/s. Valve area (VTI): 1.82 cm^2. Valve area (Vmax):   1.55 cm^2. Valve area (Vmean): 1.48 cm^2. - Right atrium: There is RV diastolic collapse indicative of   increased intrapericardial pressure along with dilated IVC. - Tricuspid valve: There was mild regurgitation. - Pulmonary arteries: Systolic pressure was mildly increased. PA   peak pressure: 38 mm Hg (S). - Pericardium, extracardiac: A large pericardial effusion was   identified circumferential to the heart (2.2 cm posterior   lateral). Findings are concerning for increased intrapericardial   pressure.   Laboratory Data:  Chemistry Recent Labs  Lab 06/02/18 1720 06/03/18 0602 06/04/18 0414  NA 139 138 148*  K 5.5* 3.4* 3.6  CL 103 108 113*  CO2 24 22 28   GLUCOSE 145* 101* 95  BUN 19 14 9   CREATININE 0.94 0.79 0.69  CALCIUM 8.8* 7.5* 7.7*  GFRNONAA 52* >60 >60  GFRAA >60 >60 >60  ANIONGAP 12 8 7     Recent Labs  Lab 06/02/18 1720 06/03/18 0602  PROT 6.5 5.7*  ALBUMIN 3.9 3.1*  AST 38 18  ALT 16 13  ALKPHOS 49 39  BILITOT 1.6* 1.0   Hematology Recent Labs  Lab 06/02/18 1720 06/03/18 0602 06/04/18 0414  WBC 18.1* 10.5 7.5  RBC 4.86 4.01 3.83*  HGB 15.2* 12.4 11.6*  HCT 47.1* 39.2 38.1  MCV 96.9 97.8 99.5  MCH 31.3 30.9 30.3  MCHC 32.3 31.6 30.4  RDW 17.7* 17.5* 17.7*  PLT 265 216 201   Cardiac Enzymes Recent Labs  Lab 06/03/18 0020 06/03/18 0602 06/03/18 1222  TROPONINI 0.03* 0.03* 0.05*   No results for input(s): TROPIPOC in the  last 168 hours.  BNP Recent Labs  Lab 06/03/18 0020  BNP 267.8*    DDimer No results for input(s): DDIMER in the last 168 hours.  Radiology/Studies:  Dg Acute Abd 2+v Abd (supine,erect,decub) + 1v Chest  Result Date: 06/02/2018 CLINICAL DATA:  Vomiting EXAM: DG ABDOMEN ACUTE W/ 1V CHEST COMPARISON:  12/02/2017, chest x-ray 04/06/2015  FINDINGS: Single view chest demonstrates cardiomegaly with vascular congestion. Probable small pleural effusions. Aortic atherosclerosis. No pneumothorax. Stable sclerotic lesion in the proximal left humerus. Supine and upright views of the abdomen demonstrate no free air beneath the diaphragm. Surgical clips in the right upper quadrant. Nonobstructed bowel-gas pattern. Extensive vascular calcifications. IMPRESSION: 1. Cardiomegaly with vascular congestion and probable trace pleural effusion 2. Nonobstructed bowel-gas pattern Electronically Signed   By: Donavan Foil M.D.   On: 06/02/2018 21:15    Assessment and Plan:   1. Large pericardial effusion: Unclear etiology. Clinically she is not tachycardic, is hemodynamically stable, and can lie nearly flat in bed.  I attempted to contact the daughter at the listed mobile number but was unable to reach her. Per the documentation in the chart, family had previously said they do not want aggressive intervention.   There is no urgency to drain the fluid at this time given her hemodynamic stability, but there was echo evidence of some RV diastolic collapse. I would prefer to have an understanding with family re: possible outcome if this fluid progresses. For now I would avoid aggressive diuresis, and if she becomes hypotensive would give IV fluids.   This is a difficult situation; given her age, mental status, prior documented wishes of family, and unclear etiology of the effusion. It is clearly not acute given that she is tolerating the large effusion well.  We will continue to follow.  For questions or updates, please contact [place] Please consult [email or web address] for contact info under   Signed, Buford Dresser, MD  06/04/2018 11:48 AM

## 2018-06-04 NOTE — Progress Notes (Signed)
PROGRESS NOTE  Lynelle Weiler Verma ZSW:[id number] DOB: 1923-04-16 DOA: 06/02/2018 PCP: Vernie Shanks, MD  HPI/Recap of past 24 hours:  Agitated yesterday evening received Zyprexa and Ativan, currently sleeping Daughter thinks zyprexa made patient agitation worse  No more n/v since in the hospital, daughter wonders if diet could be advanced  Overall, she has poor oral intake Urine is less cloudy today    Assessment/Plan: Active Problems:   Hypothyroidism   Dementia (HCC)   Hyperlipidemia   Hypertension   Sepsis ([place])   Pyelonephritis   Dehydration   Intractable nausea and vomiting   Acute UTI   Pericardial effusion   UTI/pyelonephritis/n/v -patient reports new odor In the urine to family prior to coming to the hospital -urine is dark /cloudy on presentation, she has leukocytosis/alctic acidosis on presentation, kub "Nonobstructed bowel-gas pattern" -blood culture no growth, urine culture + ecoli, mrsa screening negative ,continue rocephin for now  Hypernatremia: Likely from poor oral intake Start d51/2saline  Large pericardiAL effusion: Findings are concerning for increased intrapericardial   pressure.  -bnp 267, troponin 0.03-0.03-0.05 -She does not appear to have chest pain, although she can not provide reliable history -bp stable,  -cardiology consulted    Hyperkalemia: (on presentation) On presentation, normalized.  COPD; continue home mebs No cough  Dementia from a memory care with delirium /[employer], d/c tele, bladder scan, start stool softener to prevent constipation Prn ativan,  Family states zyprexa made her more agitated  Hypothyroidism: continue home meds thyroids supplement  H/o frequent falls, had pubic ramus fracture in 11/2017 due to falls, she walks with a walker, from a memory care units, family prefer her go back to memory care units, will likely need home health.   Code Status: DNR  Family Communication: patient and  daughter at bedside daily  Disposition Plan: return to memory care on Monday if clinically stable with cardiology clearance F/u on final urine culture result   Consultants:  cardiology  Procedures:  none  Antibiotics:  Rocephin    Objective: BP 120/69 (BP Location: Right Arm)   Pulse 86   Temp 99.3 F (37.4 C) (Oral)   Resp (!) 22   Ht 4\' 11"  (1.499 m)   Wt 54.4 kg   SpO2 92%   BMI 24.24 kg/m   Intake/Output Summary (Last 24 hours) at 06/04/2018 0754 Last data filed at 06/04/2018 0600 Gross per 24 hour  Intake 1191.14 ml  Output 2050 ml  Net -858.86 ml   Filed Weights   06/02/18 1703  Weight: 54.4 kg    Exam: Patient is examined daily including today on 06/04/2018, exams remain the same as of yesterday except that has changed    General:  sleeping  Cardiovascular: RRR  Respiratory: diminished at basis  Abdomen: Soft/ND/NT, positive BS  Musculoskeletal: No Edema  Neuro: sleeping  Data Reviewed: Basic Metabolic Panel: Recent Labs  Lab 06/02/18 1720 06/03/18 0602 06/04/18 0414  NA 139 138 148*  K 5.5* 3.4* 3.6  CL 103 108 113*  CO2 24 22 28   GLUCOSE 145* 101* 95  BUN 19 14 9   CREATININE 0.94 0.79 0.69  CALCIUM 8.8* 7.5* 7.7*  MG  --  2.0 2.2  PHOS  --  3.0  --    Liver Function Tests: Recent Labs  Lab 06/02/18 1720 06/03/18 0602  AST 38 18  ALT 16 13  ALKPHOS 49 39  BILITOT 1.6* 1.0  PROT 6.5 5.7*  ALBUMIN 3.9 3.1*   Recent  Labs  Lab 06/02/18 1720  LIPASE 33   No results for input(s): AMMONIA in the last 168 hours. CBC: Recent Labs  Lab 06/02/18 1720 06/03/18 0602 06/04/18 0414  WBC 18.1* 10.5 7.5  NEUTROABS  --   --  5.9  HGB 15.2* 12.4 11.6*  HCT 47.1* 39.2 38.1  MCV 96.9 97.8 99.5  PLT 265 216 201   Cardiac Enzymes:   Recent Labs  Lab 06/03/18 0020 06/03/18 0602 06/03/18 1222  TROPONINI 0.03* 0.03* 0.05*   BNP (last 3 results) Recent Labs    06/03/18 0020  BNP 267.8*    ProBNP (last 3 results) No  results for input(s): PROBNP in the last 8760 hours.  CBG: No results for input(s): GLUCAP in the last 168 hours.  Recent Results (from the past 240 hour(s))  Culture, blood (x 2)     Status: None (Preliminary result)   Collection Time: 06/03/18 12:20 AM  Result Value Ref Range Status   Specimen Description   Final    BLOOD LEFT ARM Performed at [facility], [address], [place], [place] [address]    Special Requests   Final    BOTTLES DRAWN AEROBIC AND ANAEROBIC Blood Culture adequate volume Performed at [facility] [address], [place], [place] [address]    Culture NO GROWTH 1 DAY  Final   Report Status PENDING  Incomplete  Culture, blood (x 2)     Status: None (Preliminary result)   Collection Time: 06/03/18 12:20 AM  Result Value Ref Range Status   Specimen Description   Final    BLOOD RIGHT HAND Performed at [facility], [place] [address] [place], [place] [address]    Special Requests   Final    BOTTLES DRAWN AEROBIC AND ANAEROBIC Blood Culture adequate volume Performed at [facility] [address], [place], [place] [address]    Culture NO GROWTH 1 DAY  Final   Report Status PENDING  Incomplete  MRSA PCR Screening     Status: None   Collection Time: 06/03/18  8:40 AM  Result Value Ref Range Status   MRSA by PCR NEGATIVE NEGATIVE Final    Comment:        The GeneXpert MRSA Assay (FDA approved for NASAL specimens only), is one component of a comprehensive MRSA colonization surveillance program. It is not intended to diagnose MRSA infection nor to guide or monitor treatment for MRSA infections. Performed at [facility], [place] [address], [place], [place] [address]      Studies: No results found.  Scheduled Meds: . aspirin  81 mg Oral BID  . citalopram  20 mg Oral Daily  . enoxaparin (LOVENOX) injection  30 mg Subcutaneous QHS  . haloperidol lactate      . ipratropium-albuterol  3  mL Nebulization BID  . OLANZapine  2.5 mg Oral QHS  . senna-docusate  1 tablet Oral BID  . thyroid  60 mg Oral Daily    Continuous Infusions: . sodium chloride 50 mL/hr at 06/04/18 0603  . cefTRIAXone (ROCEPHIN)  IV 1 g (06/03/18 2211)     Time spent: 73mins I have personally reviewed and interpreted on  06/04/2018 daily labs,  imagings as discussed above under date review session and assessment and plans.  I reviewed all nursing notes, pharmacy notes, vitals, pertinent old records  I have discussed plan of care as described above with RN , patient and family on 06/04/2018  Florencia Reasons MD, PhD  [facility] Pager [phone]. If 7PM-7AM, please contact night-coverage at [email or web address], password [facility] 06/04/2018, 7:54 AM  LOS: 1 day

## 2018-06-05 LAB — BASIC METABOLIC PANEL
Anion gap: 8 (ref 5–15)
BUN: 9 mg/dL (ref 8–23)
CO2: 30 mmol/L (ref 22–32)
Calcium: 7.9 mg/dL — ABNORMAL LOW (ref 8.9–10.3)
Chloride: 107 mmol/L (ref 98–111)
Creatinine, Ser: 0.66 mg/dL (ref 0.44–1.00)
GFR calc Af Amer: >60 mL/min (ref >60)
GFR calc non Af Amer: >60 mL/min (ref >60)
Glucose, Bld: 128 mg/dL — ABNORMAL HIGH (ref 70–99)
POTASSIUM: 3 mmol/L — AB (ref 3.5–5.1)
Sodium: 145 mmol/L (ref 135–145)

## 2018-06-05 LAB — URINE CULTURE: Culture: >=100000 — AB

## 2018-06-05 LAB — CBC WITH DIFFERENTIAL/PLATELET
Abs Immature Granulocytes: 0.03 10*3/uL (ref 0.00–0.07)
Basophils Absolute: 0 10*3/uL (ref 0.0–0.1)
Basophils Relative: 0 %
Eosinophils Absolute: 0.2 10*3/uL (ref 0.0–0.5)
Eosinophils Relative: 2 %
HCT: 39.9 % (ref 36.0–46.0)
Hemoglobin: 12.5 g/dL (ref 12.0–15.0)
Immature Granulocytes: 0 %
Lymphocytes Relative: 15 %
Lymphs Abs: 1.5 10*3/uL (ref 0.7–4.0)
MCH: 31.1 pg (ref 26.0–34.0)
MCHC: 31.3 g/dL (ref 30.0–36.0)
MCV: 99.3 fL (ref 80.0–100.0)
Monocytes Absolute: 0.7 10*3/uL (ref 0.1–1.0)
Monocytes Relative: 7 %
Neutro Abs: 7.5 10*3/uL (ref 1.7–7.7)
Neutrophils Relative %: 76 %
PLATELETS: 207 10*3/uL (ref 150–400)
RBC: 4.02 MIL/uL (ref 3.87–5.11)
RDW: 17.2 % — ABNORMAL HIGH (ref 11.5–15.5)
WBC: 10.1 10*3/uL (ref 4.0–10.5)
nRBC: 0 % (ref 0.0–0.2)

## 2018-06-05 MED ORDER — LIP MEDEX EX OINT
TOPICAL_OINTMENT | CUTANEOUS | Status: AC
  Administered 2018-06-05: 1
  Filled 2018-06-05: qty 7

## 2018-06-05 MED ORDER — IPRATROPIUM-ALBUTEROL 0.5-2.5 (3) MG/3ML IN SOLN: 3.0000 mL | Freq: Four times a day (QID) | RESPIRATORY_TRACT | Status: DC | PRN

## 2018-06-05 MED ORDER — POTASSIUM CHLORIDE CRYS ER 20 MEQ PO TBCR
40.0000 meq | EXTENDED_RELEASE_TABLET | Freq: Once | ORAL | Status: AC
  Administered 2018-06-05: 40 meq via ORAL
  Filled 2018-06-05: qty 2

## 2018-06-05 NOTE — Progress Notes (Signed)
PROGRESS NOTE    Alice Hayes  JQB:[id number] DOB: 1922-09-15 DOA: 06/02/2018 PCP: Vernie Shanks, MD    Brief Narrative:  85 year old with past medical history relevant for dementia, hypothyroidism, hypertension, hyperlipidemia admitted with nausea and vomiting and found to have UTI as well as large pericardial effusion.   Assessment & Plan:   Active Problems:   Hypothyroidism   Dementia (HCC)   Hyperlipidemia   Hypertension   Sepsis ([place])   Pyelonephritis   Dehydration   Intractable nausea and vomiting   Acute UTI   Pericardial effusion   Hypernatremia   #) Acute delirium superimposed on dementia: Patient's baseline is unclear however she appears only to be oriented to herself.  She is also intermittently somewhat agitated.  It is unclear how much of this is a contribution of her underlying dementia secondary to hospital-acquired delirium and possible UTI. - Delirium precautions  #) UTI: Growing pansensitive E. coli -Continue ceftriaxone started 06/03/2016  #) Large pericardial effusion: Per review of the chart patient apparently has a known history per the daughter of a large pericardial effusion that was being monitored.  Echo today showed that there was some evidence of RV collapse.  Cardiology was discussed with the family and they are currently not interested in any invasive evaluation.  #) Hypothyroidism: -Continue [place] Thyroid 60 mg daily  #) Pain/psych: -Continue citalopram 20 mg daily  Fluids: Tolerating p.o. Electrolytes: Monitor and supplement Nutrition: Regular diet   Prophylaxis: Enoxaparin   Disposition: Pending resolution of altered mental status  DO NOT RESUSCITATE  Consultants:   Cardiology  Procedures: 06/03/2018 echo:- Left ventricle: The cavity size was normal. There was mild focal   basal hypertrophy of the septum. Systolic function was normal.   The estimated ejection fraction was in the range of 60% to 65%.   Wall motion was  normal; there were no regional wall motion   abnormalities. Doppler parameters are consistent with abnormal   left ventricular relaxation (grade 1 diastolic dysfunction).   Doppler parameters are consistent with high ventricular filling   pressure. - Aortic valve: Trileaflet; moderately thickened, moderately   calcified leaflets. Valve mobility was restricted. There was very   mild stenosis. There was trivial regurgitation. Peak velocity   (S): 204 cm/s. Valve area (VTI): 1.82 cm^2. Valve area (Vmax):   1.55 cm^2. Valve area (Vmean): 1.48 cm^2. - Right atrium: There is RV diastolic collapse indicative of   increased intrapericardial pressure along with dilated IVC. - Tricuspid valve: There was mild regurgitation. - Pulmonary arteries: Systolic pressure was mildly increased. PA   peak pressure: 38 mm Hg (S). - Pericardium, extracardiac: A large pericardial effusion was   identified circumferential to the heart (2.2 cm posterior   lateral). Findings are concerning for increased intrapericardial   pressure.    Antimicrobials:   IV ceftriaxone started 06/03/2018   Subjective: This morning patient is somewhat agitated but awake and alert only to herself.  She does not have any complaints at this time.  She does not respond to any questions coherently.  Objective: Vitals:   06/04/18 2133 06/05/18 0338 06/05/18 0444 06/05/18 0819  BP:  (!) 157/64  (!) 152/76  Pulse:  76  71  Resp:  18  16  Temp:  (!) 97.4 F (36.3 C)  98.1 F (36.7 C)  TempSrc:  Oral  Oral  SpO2: 95% 97% 98% 97%  Weight:      Height:        Intake/Output Summary (Last  24 hours) at 06/05/2018 1256 Last data filed at 06/05/2018 1000 Gross per 24 hour  Intake 870.25 ml  Output 1525 ml  Net -654.75 ml   Filed Weights   06/02/18 1703  Weight: 54.4 kg    Examination:  General exam: Mildly agitated Respiratory system: Clear to auscultation. Respiratory effort normal. Cardiovascular system: Distant heart  sounds, regular rate and rhythm, no murmurs Gastrointestinal system: Abdomen is nondistended, soft and nontender. No organomegaly or masses felt. Normal bowel sounds heard. Central nervous system: Alert but not oriented, grossly intact, moving all extremities Extremities: No lower extremity edema Skin: No rashes over visible skin Psychiatry: Unable to assess due to underlying dementia    Data Reviewed: I have personally reviewed following labs and imaging studies  CBC: Recent Labs  Lab 06/02/18 1720 06/03/18 0602 06/04/18 0414 06/05/18 0502  WBC 18.1* 10.5 7.5 10.1  NEUTROABS  --   --  5.9 7.5  HGB 15.2* 12.4 11.6* 12.5  HCT 47.1* 39.2 38.1 39.9  MCV 96.9 97.8 99.5 99.3  PLT 265 216 201 106   Basic Metabolic Panel: Recent Labs  Lab 06/02/18 1720 06/03/18 0602 06/04/18 0414 06/05/18 0502  NA 139 138 148* 145  K 5.5* 3.4* 3.6 3.0*  CL 103 108 113* 107  CO2 24 22 28 30   GLUCOSE 145* 101* 95 128*  BUN 19 14 9 9   CREATININE 0.94 0.79 0.69 0.66  CALCIUM 8.8* 7.5* 7.7* 7.9*  MG  --  2.0 2.2  --   PHOS  --  3.0  --   --    GFR: Estimated Creatinine Clearance: 33.1 mL/min (by C-G formula based on SCr of 0.66 mg/dL). Liver Function Tests: Recent Labs  Lab 06/02/18 1720 06/03/18 0602  AST 38 18  ALT 16 13  ALKPHOS 49 39  BILITOT 1.6* 1.0  PROT 6.5 5.7*  ALBUMIN 3.9 3.1*   Recent Labs  Lab 06/02/18 1720  LIPASE 33   No results for input(s): AMMONIA in the last 168 hours. Coagulation Profile: No results for input(s): INR, PROTIME in the last 168 hours. Cardiac Enzymes: Recent Labs  Lab 06/03/18 0020 06/03/18 0602 06/03/18 1222  TROPONINI 0.03* 0.03* 0.05*   BNP (last 3 results) No results for input(s): PROBNP in the last 8760 hours. HbA1C: No results for input(s): HGBA1C in the last 72 hours. CBG: No results for input(s): GLUCAP in the last 168 hours. Lipid Profile: No results for input(s): CHOL, HDL, LDLCALC, TRIG, CHOLHDL, LDLDIRECT in the last 72  hours. Thyroid Function Tests: Recent Labs    06/03/18 0602  TSH 1.614   Anemia Panel: No results for input(s): VITAMINB12, FOLATE, FERRITIN, TIBC, IRON, RETICCTPCT in the last 72 hours. Sepsis Labs: Recent Labs  Lab 06/03/18 0020 06/03/18 0348  PROCALCITON 0.12  --   LATICACIDVEN 2.4* 1.1    Recent Results (from the past 240 hour(s))  Urine Culture     Status: Abnormal   Collection Time: 06/02/18  7:42 PM  Result Value Ref Range Status   Specimen Description   Final    URINE, CLEAN CATCH Performed at [facility], [place] [address] [place], [place] [address]    Special Requests   Final    NONE Performed at [facility], [employer] [address] [place], [place] [address]    Culture >=100,000 COLONIES/mL ESCHERICHIA COLI (A)  Final   Report Status 06/05/2018 FINAL  Final   Organism ID, Bacteria ESCHERICHIA COLI (A)  Final  Susceptibility   Escherichia coli - MIC*    AMPICILLIN 8 SENSITIVE Sensitive     CEFAZOLIN <=4 SENSITIVE Sensitive     CEFTRIAXONE <=1 SENSITIVE Sensitive     CIPROFLOXACIN <=0.25 SENSITIVE Sensitive     GENTAMICIN <=1 SENSITIVE Sensitive     IMIPENEM <=0.25 SENSITIVE Sensitive     NITROFURANTOIN <=16 SENSITIVE Sensitive     TRIMETH/SULFA <=20 SENSITIVE Sensitive     AMPICILLIN/SULBACTAM 4 SENSITIVE Sensitive     PIP/TAZO <=4 SENSITIVE Sensitive     Extended ESBL NEGATIVE Sensitive     * >=100,000 COLONIES/mL ESCHERICHIA COLI  Culture, blood (x 2)     Status: None (Preliminary result)   Collection Time: 06/03/18 12:20 AM  Result Value Ref Range Status   Specimen Description   Final    BLOOD LEFT ARM Performed at [facility], [place] [address], [place], [place] [address]    Special Requests   Final    BOTTLES DRAWN AEROBIC AND ANAEROBIC Blood Culture adequate volume Performed at [facility] [address] [place], [place] [address]    Culture NO GROWTH 2 DAYS  Final   Report Status  PENDING  Incomplete  Culture, blood (x 2)     Status: None (Preliminary result)   Collection Time: 06/03/18 12:20 AM  Result Value Ref Range Status   Specimen Description   Final    BLOOD RIGHT HAND Performed at [facility], [place] [address], [place], [place] [address]    Special Requests   Final    BOTTLES DRAWN AEROBIC AND ANAEROBIC Blood Culture adequate volume Performed at [facility] [address], [place], [place] [address]    Culture NO GROWTH 2 DAYS  Final   Report Status PENDING  Incomplete  MRSA PCR Screening     Status: None   Collection Time: 06/03/18  8:40 AM  Result Value Ref Range Status   MRSA by PCR NEGATIVE NEGATIVE Final    Comment:        The GeneXpert MRSA Assay (FDA approved for NASAL specimens only), is one component of a comprehensive MRSA colonization surveillance program. It is not intended to diagnose MRSA infection nor to guide or monitor treatment for MRSA infections. Performed at [facility], [place] [address], [place], [place] [address]          Radiology Studies: No results found.      Scheduled Meds: . aspirin  81 mg Oral BID  . citalopram  20 mg Oral Daily  . enoxaparin (LOVENOX) injection  30 mg Subcutaneous QHS  . ipratropium-albuterol  3 mL Nebulization BID  . senna-docusate  1 tablet Oral BID  . thyroid  60 mg Oral Daily   Continuous Infusions: . cefTRIAXone (ROCEPHIN)  IV 1 g (06/04/18 2206)     LOS: 2 days    Time spent: [place], MD [facility]  If 7PM-7AM, please contact night-coverage [email or web address] Password [facility] 06/05/2018, 12:56 PM

## 2018-06-05 NOTE — Progress Notes (Signed)
Progress Note  Patient Name: Anaissa Macfadden Hart[place] Date of Encounter: 06/05/2018  Primary Cardiologist: New (Dr. Buford Dresser)  Subjective   Patient more alert this AM, daughter at bedside. Had a long discussion re: pericardial effusion, potential causes, likely chronic, options for management. Daughter does not wish to pursue any invasive management at this point. Daughter notes that patient's primary physician has seen evidence of "enlarged heart" for some time. Also notes that she has a history of melanoma, though no known recurrence.  Inpatient Medications    Scheduled Meds: . lip balm      . aspirin  81 mg Oral BID  . citalopram  20 mg Oral Daily  . enoxaparin (LOVENOX) injection  30 mg Subcutaneous QHS  . ipratropium-albuterol  3 mL Nebulization BID  . potassium chloride  40 mEq Oral Once  . senna-docusate  1 tablet Oral BID  . thyroid  60 mg Oral Daily   Continuous Infusions: . cefTRIAXone (ROCEPHIN)  IV 1 g (06/04/18 2206)   PRN Meds: acetaminophen **OR** acetaminophen, HYDROcodone-acetaminophen, LORazepam, metoCLOPramide (REGLAN) injection, ondansetron **OR** ondansetron (ZOFRAN) IV   Vital Signs    Vitals:   06/04/18 2133 06/05/18 0338 06/05/18 0444 06/05/18 0819  BP:  (!) 157/64  (!) 152/76  Pulse:  76  71  Resp:  18  16  Temp:  (!) 97.4 F (36.3 C)  98.1 F (36.7 C)  TempSrc:  Oral  Oral  SpO2: 95% 97% 98% 97%  Weight:      Height:        Intake/Output Summary (Last 24 hours) at 06/05/2018 1037 Last data filed at 06/05/2018 1000 Gross per 24 hour  Intake 870.25 ml  Output 1525 ml  Net -654.75 ml   Filed Weights   06/02/18 1703  Weight: 54.4 kg    Telemetry    Not on telemetry - Personally Reviewed  ECG    No new since 1/3- Personally Reviewed  Physical Exam   GEN: No acute distress.  Sitting up in bed, mitts on, eating frozen ice treat Neck: JVD to low neck sitting upright Cardiac: RRR, no murmurs, rubs, or gallops. Distant heart  sounds. Respiratory: Clear to auscultation bilaterally except for diminishment at bilateral bases GI: Soft, nontender, non-distended  MS: No edema; No deformity. Neuro:  Nonfocal  Psych: conversant but not appropriate  Labs    Chemistry Recent Labs  Lab 06/02/18 1720 06/03/18 0602 06/04/18 0414 06/05/18 0502  NA 139 138 148* 145  K 5.5* 3.4* 3.6 3.0*  CL 103 108 113* 107  CO2 24 22 28 30   GLUCOSE 145* 101* 95 128*  BUN 19 14 9 9   CREATININE 0.94 0.79 0.69 0.66  CALCIUM 8.8* 7.5* 7.7* 7.9*  PROT 6.5 5.7*  --   --   ALBUMIN 3.9 3.1*  --   --   AST 38 18  --   --   ALT 16 13  --   --   ALKPHOS 49 39  --   --   BILITOT 1.6* 1.0  --   --   GFRNONAA 52* >60 >60 >60  GFRAA >60 >60 >60 >60  ANIONGAP 12 8 7 8      Hematology Recent Labs  Lab 06/03/18 0602 06/04/18 0414 06/05/18 0502  WBC 10.5 7.5 10.1  RBC 4.01 3.83* 4.02  HGB 12.4 11.6* 12.5  HCT 39.2 38.1 39.9  MCV 97.8 99.5 99.3  MCH 30.9 30.3 31.1  MCHC 31.6 30.4 31.3  RDW 17.5* 17.7*  17.2*  PLT 216 201 207    Cardiac Enzymes Recent Labs  Lab 06/03/18 0020 06/03/18 0602 06/03/18 1222  TROPONINI 0.03* 0.03* 0.05*   No results for input(s): TROPIPOC in the last 168 hours.   BNP Recent Labs  Lab 06/03/18 0020  BNP 267.8*     DDimer No results for input(s): DDIMER in the last 168 hours.   Radiology    No results found.  Cardiac Studies   Echo this admission: - Left ventricle: The cavity size was normal. There was mild focal basal hypertrophy of the septum. Systolic function was normal. The estimated ejection fraction was in the range of 60% to 65%. Wall motion was normal; there were no regional wall motion abnormalities. Doppler parameters are consistent with abnormal left ventricular relaxation (grade 1 diastolic dysfunction). Doppler parameters are consistent with high ventricular filling pressure. - Aortic valve: Trileaflet; moderately thickened, moderately calcified  leaflets. Valve mobility was restricted. There was very mild stenosis. There was trivial regurgitation. Peak velocity (S): 204 cm/s. Valve area (VTI): 1.82 cm^2. Valve area (Vmax): 1.55 cm^2. Valve area (Vmean): 1.48 cm^2. - Right atrium: There is RV diastolic collapse indicative of increased intrapericardial pressure along with dilated IVC. - Tricuspid valve: There was mild regurgitation. - Pulmonary arteries: Systolic pressure was mildly increased. PA peak pressure: 38 mm Hg (S). - Pericardium, extracardiac: A large pericardial effusion was identified circumferential to the heart (2.2 cm posterior lateral). Findings are concerning for increased intrapericardial pressure.  Patient Profile     85 y.o. female hx of dementia, noted COPD, diastolic HR, HLD, HTN who is being seen for the evaluation of pericardial effusion at the request of Dr. Erlinda Hong.  Assessment & Plan    Large pericardial effusion: discussed extensively with daughter today. Discussed that without a tap, we cannot with certainty determine etiology. It is not acute given the lack of hemodynamic compromise with the large effusion size. We talked about the uncertainty as to whether this will progress to tamponade and how fast. It is truly impossible to say. She understands. Family is in agreement that the patient would not want invasive procedures, and therefore there is no plan for pericardiocentesis. We discussed outpatient follow up, and daughter noted that it would not change management, and it is difficult to get the patient to/from appointments. I offered my services if they ever change their mind.  [facility] will sign off.   Medication Recommendations:  noen Other recommendations (labs, testing, etc):  none Follow up as an outpatient:  Declines outpatient follow up and repeat echocardiogram at this time.  For questions or updates, please contact [place] Please consult [email or web address] for contact  info under        Signed, Buford Dresser, MD  06/05/2018, 10:37 AM

## 2018-06-05 NOTE — Progress Notes (Signed)
Report given to [place], RN on 5W.  Patient was incontinent of urine/wet and cleaned prior to transfer.  Tele sitter called and placed on hold for transfer.  Nevin Bloodgood, RN informed that tele sitter would need to be continued upon transfer.  Patient calm, stable at time of transfer.  Bed alarm placed prior to leaving patient in room 1536.

## 2018-06-06 LAB — BASIC METABOLIC PANEL
Anion gap: 10 (ref 5–15)
BUN: 7 mg/dL — ABNORMAL LOW (ref 8–23)
CO2: 30 mmol/L (ref 22–32)
Calcium: 8.1 mg/dL — ABNORMAL LOW (ref 8.9–10.3)
Chloride: 104 mmol/L (ref 98–111)
Creatinine, Ser: 0.59 mg/dL (ref 0.44–1.00)
GFR calc Af Amer: >60 mL/min (ref >60)
GFR calc non Af Amer: >60 mL/min (ref >60)
Glucose, Bld: 106 mg/dL — ABNORMAL HIGH (ref 70–99)
Potassium: 4.1 mmol/L (ref 3.5–5.1)
Sodium: 144 mmol/L (ref 135–145)

## 2018-06-06 LAB — MAGNESIUM: Magnesium: 2.3 mg/dL (ref 1.7–2.4)

## 2018-06-06 MED ORDER — SODIUM CHLORIDE 0.9 % IV SOLN
INTRAVENOUS | Status: DC | PRN
  Administered 2018-06-06: 500 mL via INTRAVENOUS

## 2018-06-06 MED ORDER — HYDRALAZINE HCL 20 MG/ML IJ SOLN
10.0000 mg | Freq: Four times a day (QID) | INTRAMUSCULAR | Status: DC | PRN
  Administered 2018-06-06: 10 mg via INTRAVENOUS
  Filled 2018-06-06: qty 1

## 2018-06-06 NOTE — Progress Notes (Signed)
PROGRESS NOTE    Alice Hayes  DXI:[id number] DOB: 10-Oct-1922 DOA: 06/02/2018 PCP: Vernie Shanks, MD    Brief Narrative:  85 year old with past medical history relevant for dementia, hypothyroidism, hypertension, hyperlipidemia admitted with nausea and vomiting and found to have UTI as well as large pericardial effusion.   Assessment & Plan:   Active Problems:   Hypothyroidism   Dementia (HCC)   Hyperlipidemia   Hypertension   Sepsis ([place])   Pyelonephritis   Dehydration   Intractable nausea and vomiting   Acute UTI   Pericardial effusion   Hypernatremia   #) Acute delirium superimposed on dementia: This appears to be improving.  No other UTIs been treated it is unclear how much of this is contributing to her symptoms.  Suspect most of her symptoms are related to hospital-acquired delirium. - Delirium precautions, discontinue telemetry sitter  #) UTI: Growing pansensitive E. coli -Continue ceftriaxone started 06/03/2016  #) Large pericardial effusion: Per review of the chart patient apparently has a known history per the daughter of a large pericardial effusion that was being monitored.  Echo today showed that there was some evidence of RV collapse.  Cardiology was discussed with the family and they are currently not interested in any invasive evaluation.  #) Hypothyroidism: -Continue Armour Thyroid 60 mg daily  #) Pain/psych: -Continue citalopram 20 mg daily  Fluids: Tolerating p.o. Electrolytes: Monitor and supplement Nutrition: Regular diet   Prophylaxis: Enoxaparin   Disposition: Pending 24 hours with a telemetry sitter  DO NOT RESUSCITATE  Consultants:   Cardiology  Procedures: 06/03/2018 echo:- Left ventricle: The cavity size was normal. There was mild focal   basal hypertrophy of the septum. Systolic function was normal.   The estimated ejection fraction was in the range of 60% to 65%.   Wall motion was normal; there were no regional wall motion  abnormalities. Doppler parameters are consistent with abnormal   left ventricular relaxation (grade 1 diastolic dysfunction).   Doppler parameters are consistent with high ventricular filling   pressure. - Aortic valve: Trileaflet; moderately thickened, moderately   calcified leaflets. Valve mobility was restricted. There was very   mild stenosis. There was trivial regurgitation. Peak velocity   (S): 204 cm/s. Valve area (VTI): 1.82 cm^2. Valve area (Vmax):   1.55 cm^2. Valve area (Vmean): 1.48 cm^2. - Right atrium: There is RV diastolic collapse indicative of   increased intrapericardial pressure along with dilated IVC. - Tricuspid valve: There was mild regurgitation. - Pulmonary arteries: Systolic pressure was mildly increased. PA   peak pressure: 38 mm Hg (S). - Pericardium, extracardiac: A large pericardial effusion was   identified circumferential to the heart (2.2 cm posterior   lateral). Findings are concerning for increased intrapericardial   pressure.    Antimicrobials:   IV ceftriaxone started 06/03/2018   Subjective: This morning the patient is sleeping.  She slept well through the night and did not have any further agitation.  She does not have any complaints at this time.  Objective: Vitals:   06/05/18 2110 06/06/18 0546 06/06/18 0548 06/06/18 0644  BP: (!) 158/60 (!) 179/107 (!) 174/120 (!) 155/78  Pulse: 88 81 81 84  Resp: 16 16    Temp: 98.1 F (36.7 C) 98.4 F (36.9 C)    TempSrc: Oral Oral    SpO2: 93%     Weight:      Height:        Intake/Output Summary (Last 24 hours) at 06/06/2018 1229 Last data  filed at 06/06/2018 1000 Gross per 24 hour  Intake 580 ml  Output 700 ml  Net -120 ml   Filed Weights   06/02/18 1703  Weight: 54.4 kg    Examination:  General exam: Lying in bed comfortably Respiratory system: Clear to auscultation. Respiratory effort normal. Cardiovascular system: Distant heart sounds, regular rate and rhythm, no  murmurs Gastrointestinal system: Abdomen is nondistended, soft and nontender. No organomegaly or masses felt. Normal bowel sounds heard. Central nervous system: Alert but not oriented, grossly intact, moving all extremities Extremities: No lower extremity edema Skin: No rashes over visible skin Psychiatry: Unable to assess due to underlying dementia    Data Reviewed: I have personally reviewed following labs and imaging studies  CBC: Recent Labs  Lab 06/02/18 1720 06/03/18 0602 06/04/18 0414 06/05/18 0502  WBC 18.1* 10.5 7.5 10.1  NEUTROABS  --   --  5.9 7.5  HGB 15.2* 12.4 11.6* 12.5  HCT 47.1* 39.2 38.1 39.9  MCV 96.9 97.8 99.5 99.3  PLT 265 216 201 093   Basic Metabolic Panel: Recent Labs  Lab 06/02/18 1720 06/03/18 0602 06/04/18 0414 06/05/18 0502 06/06/18 0440  NA 139 138 148* 145 144  K 5.5* 3.4* 3.6 3.0* 4.1  CL 103 108 113* 107 104  CO2 24 22 28 30 30   GLUCOSE 145* 101* 95 128* 106*  BUN 19 14 9 9  7*  CREATININE 0.94 0.79 0.69 0.66 0.59  CALCIUM 8.8* 7.5* 7.7* 7.9* 8.1*  MG  --  2.0 2.2  --  2.3  PHOS  --  3.0  --   --   --    GFR: Estimated Creatinine Clearance: 33.1 mL/min (by C-G formula based on SCr of 0.59 mg/dL). Liver Function Tests: Recent Labs  Lab 06/02/18 1720 06/03/18 0602  AST 38 18  ALT 16 13  ALKPHOS 49 39  BILITOT 1.6* 1.0  PROT 6.5 5.7*  ALBUMIN 3.9 3.1*   Recent Labs  Lab 06/02/18 1720  LIPASE 33   No results for input(s): AMMONIA in the last 168 hours. Coagulation Profile: No results for input(s): INR, PROTIME in the last 168 hours. Cardiac Enzymes: Recent Labs  Lab 06/03/18 0020 06/03/18 0602 06/03/18 1222  TROPONINI 0.03* 0.03* 0.05*   BNP (last 3 results) No results for input(s): PROBNP in the last 8760 hours. HbA1C: No results for input(s): HGBA1C in the last 72 hours. CBG: No results for input(s): GLUCAP in the last 168 hours. Lipid Profile: No results for input(s): CHOL, HDL, LDLCALC, TRIG, CHOLHDL,  LDLDIRECT in the last 72 hours. Thyroid Function Tests: No results for input(s): TSH, T4TOTAL, FREET4, T3FREE, THYROIDAB in the last 72 hours. Anemia Panel: No results for input(s): VITAMINB12, FOLATE, FERRITIN, TIBC, IRON, RETICCTPCT in the last 72 hours. Sepsis Labs: Recent Labs  Lab 06/03/18 0020 06/03/18 0348  PROCALCITON 0.12  --   LATICACIDVEN 2.4* 1.1    Recent Results (from the past 240 hour(s))  Urine Culture     Status: Abnormal   Collection Time: 06/02/18  7:42 PM  Result Value Ref Range Status   Specimen Description   Final    URINE, CLEAN CATCH Performed at [facility], [place] [address] [place], [place] [address]    Special Requests   Final    NONE Performed at [facility], [place] [address] [place], [place] [address]    Culture >=100,000 COLONIES/mL ESCHERICHIA COLI (A)  Final   Report Status 06/05/2018 FINAL  Final  Organism ID, Bacteria ESCHERICHIA COLI (A)  Final      Susceptibility   Escherichia coli - MIC*    AMPICILLIN 8 SENSITIVE Sensitive     CEFAZOLIN <=4 SENSITIVE Sensitive     CEFTRIAXONE <=1 SENSITIVE Sensitive     CIPROFLOXACIN <=0.25 SENSITIVE Sensitive     GENTAMICIN <=1 SENSITIVE Sensitive     IMIPENEM <=0.25 SENSITIVE Sensitive     NITROFURANTOIN <=16 SENSITIVE Sensitive     TRIMETH/SULFA <=20 SENSITIVE Sensitive     AMPICILLIN/SULBACTAM 4 SENSITIVE Sensitive     PIP/TAZO <=4 SENSITIVE Sensitive     Extended ESBL NEGATIVE Sensitive     * >=100,000 COLONIES/mL ESCHERICHIA COLI  Culture, blood (x 2)     Status: None (Preliminary result)   Collection Time: 06/03/18 12:20 AM  Result Value Ref Range Status   Specimen Description   Final    BLOOD LEFT ARM Performed at [facility], [address] [place], [place] [address]    Special Requests   Final    BOTTLES DRAWN AEROBIC AND ANAEROBIC Blood Culture adequate volume Performed at [place] [address] [place],  [place] [address]    Culture NO GROWTH 2 DAYS  Final   Report Status PENDING  Incomplete  Culture, blood (x 2)     Status: None (Preliminary result)   Collection Time: 06/03/18 12:20 AM  Result Value Ref Range Status   Specimen Description   Final    BLOOD RIGHT HAND Performed at [facility], [place] [address] [place], [place] [address]    Special Requests   Final    BOTTLES DRAWN AEROBIC AND ANAEROBIC Blood Culture adequate volume Performed at [place] [address], [place], [place] [address]    Culture NO GROWTH 2 DAYS  Final   Report Status PENDING  Incomplete  MRSA PCR Screening     Status: None   Collection Time: 06/03/18  8:40 AM  Result Value Ref Range Status   MRSA by PCR NEGATIVE NEGATIVE Final    Comment:        The GeneXpert MRSA Assay (FDA approved for NASAL specimens only), is one component of a comprehensive MRSA colonization surveillance program. It is not intended to diagnose MRSA infection nor to guide or monitor treatment for MRSA infections. Performed at [facility], [place] [address], [place],  [address]          Radiology Studies: No results found.      Scheduled Meds: . aspirin  81 mg Oral BID  . citalopram  20 mg Oral Daily  . enoxaparin (LOVENOX) injection  30 mg Subcutaneous QHS  . senna-docusate  1 tablet Oral BID  . thyroid  60 mg Oral Daily   Continuous Infusions: . cefTRIAXone (ROCEPHIN)  IV 1 g (06/05/18 2104)     LOS: 3 days    Time spent: [place], MD [facility]  If 7PM-7AM, please contact night-coverage [email or web address] Password [facility] 06/06/2018, 12:29 PM

## 2018-06-06 NOTE — Care Management Important Message (Signed)
Important Message  Patient Details  Name: ALJEAN HORIUCHI MRN: [id number] Date of Birth: 07-09-1922   Medicare Important Message Given:  Yes    Kerin Salen 06/06/2018, 12:12 [place] Message  Patient Details  Name: MARIUM RAGAN MRN: [id number] Date of Birth: [birth year]   Medicare Important Message Given:  Yes    Kerin Salen 06/06/2018, 12:12 PM

## 2018-06-07 MED ORDER — AMOXICILLIN 875 MG PO TABS: 875.0000 mg | ORAL_TABLET | Freq: Two times a day (BID) | ORAL | 0 refills | Status: AC

## 2018-06-07 MED ORDER — ENOXAPARIN SODIUM 40 MG/0.4ML ~~LOC~~ SOLN
40.0000 mg | Freq: Every day | SUBCUTANEOUS | Status: DC
  Administered 2018-06-07: 40 mg via SUBCUTANEOUS
  Filled 2018-06-07: qty 0.4

## 2018-06-07 NOTE — NC FL2 (Signed)
[place] LEVEL OF CARE SCREENING TOOL     IDENTIFICATION  Patient Name: Alice Hayes Birthdate: [birth year] Sex: female Admission Date (Current Location): 06/02/2018  [facility] and [place] Number:  Herbalist and Address:  [facility],  [place] [address], [place]      Provider Number: [id number]  Attending Physician Name and Address:  Cristy Folks, MD  Relative Name and Phone Number:  [name], [phone]    Current Level of Care: Hospital Recommended Level of Care: Assisted Living Facility([facility] memory [facility]) Prior Approval Number:    Date Approved/Denied:   PASRR Number:    Discharge Plan: Home(memory care facility)    Current Diagnoses: Patient Active Problem List   Diagnosis Date Noted  . Hypernatremia   . Acute UTI   . Pericardial effusion   . Sepsis ([place]) 06/02/2018  . Pyelonephritis 06/02/2018  . Dehydration 06/02/2018  . Intractable nausea and vomiting 06/02/2018  . Hypothyroidism 12/01/2017  . Dementia ([place]) 12/01/2017  . Hyperlipidemia 12/01/2017  . Fracture of pubic ramus ([place]) 12/01/2017  . Hypertension 12/01/2017    Orientation RESPIRATION BLADDER Height & Weight     Self  O2(2L) Incontinent, External catheter Weight: 120 lb (54.4 kg) Height:  4\' 11"  (149.9 cm)  BEHAVIORAL SYMPTOMS/MOOD NEUROLOGICAL BOWEL NUTRITION STATUS      Continent Diet(regular)  AMBULATORY STATUS COMMUNICATION OF NEEDS Skin   Limited Assist Verbally Normal                       Personal Care Assistance Level of Assistance  Bathing, Feeding, Dressing Bathing Assistance: Limited assistance Feeding assistance: Independent Dressing Assistance: Limited assistance     Functional Limitations Info  Sight, Hearing, Speech Sight Info: Adequate Hearing Info: Adequate Speech Info: Adequate    SPECIAL CARE FACTORS FREQUENCY  OT (By licensed OT), PT (By licensed PT)     PT Frequency: 3x wk home  health OT Frequency: 3x wk home health            Contractures Contractures Info: Not present    Additional Factors Info  Code Status, Allergies Code Status Info: DNR Allergies Info: VOLTAREN OPHTHALMIC DICLOFENAC           Current Medications (06/07/2018):  This is the current hospital active medication list Current Facility-Administered Medications  Medication Dose Route Frequency Provider Last Rate Last Dose  . 0.9 %  sodium chloride infusion   Intravenous PRN Purohit, Shrey C, MD 10 mL/hr at 06/06/18 1904 500 mL at 06/06/18 1904  . acetaminophen (TYLENOL) tablet 650 mg  650 mg Oral Q6H PRN Toy Baker, MD   650 mg at 06/03/18 1633   Or  . acetaminophen (TYLENOL) suppository 650 mg  650 mg Rectal Q6H PRN Doutova, Anastassia, MD      . aspirin chewable tablet 81 mg  81 mg Oral BID Doutova, Anastassia, MD   81 mg at 06/07/18 0830  . cefTRIAXone (ROCEPHIN) 1 g in sodium chloride 0.9 % 100 mL IVPB  1 g Intravenous QHS Toy Baker, MD   Stopped at 06/07/18 0027  . citalopram (CELEXA) tablet 20 mg  20 mg Oral Daily Doutova, Anastassia, MD   20 mg at 06/07/18 0830  . enoxaparin (LOVENOX) injection 40 mg  40 mg Subcutaneous QHS Purohit, Shrey C, MD      . hydrALAZINE (APRESOLINE) injection 10 mg  10 mg Intravenous Q6H PRN Bodenheimer, Charles A, NP   10 mg at  06/06/18 3833  . HYDROcodone-acetaminophen (NORCO/VICODIN) 5-325 MG per tablet 1-2 tablet  1-2 tablet Oral Q4H PRN Doutova, Anastassia, MD      . ipratropium-albuterol (DUONEB) 0.5-2.5 (3) MG/3ML nebulizer solution 3 mL  3 mL Nebulization Q6H PRN Purohit, Shrey C, MD      . LORazepam (ATIVAN) injection 0.5 mg  0.5 mg Intravenous Q8H PRN Florencia Reasons, MD   0.5 mg at 06/05/18 2246  . metoCLOPramide (REGLAN) injection 5 mg  5 mg Intravenous Q6H PRN Doutova, Anastassia, MD      . ondansetron (ZOFRAN) tablet 4 mg  4 mg Oral Q6H PRN Doutova, Anastassia, MD       Or  . ondansetron (ZOFRAN) injection 4 mg  4 mg Intravenous Q6H  PRN Doutova, Anastassia, MD      . senna-docusate (Senokot-S) tablet 1 tablet  1 tablet Oral BID Florencia Reasons, MD   1 tablet at 06/07/18 0831  . thyroid (ARMOUR) tablet 60 mg  60 mg Oral Daily Toy Baker, MD   60 mg at 06/07/18 0830     Discharge Medications: Please see discharge summary for a list of discharge medications.  Relevant Imaging Results:  Relevant Lab Results:   Additional Information SS# [place], [place]

## 2018-06-07 NOTE — Progress Notes (Signed)
Charity at [facility] contacted for report. Report on pt was given. Facility insisted that the pts family pick up the prescription for them and bring it to the facility. Electronic script was requested from MD.

## 2018-06-07 NOTE — Discharge Instructions (Signed)
Urinary Tract Infection, Adult A urinary tract infection (UTI) is an infection of any part of the urinary tract. The urinary tract includes:  The kidneys.  The ureters.  The bladder.  The urethra. These organs make, store, and get rid of pee (urine) in the body. What are the causes? This is caused by germs (bacteria) in your genital area. These germs grow and cause swelling (inflammation) of your urinary tract. What increases the risk? You are more likely to develop this condition if:  You have a small, thin tube (catheter) to drain pee.  You cannot control when you pee or poop (incontinence).  You are female, and: ? You use these methods to prevent pregnancy: ? A medicine that kills sperm (spermicide). ? A device that blocks sperm (diaphragm). ? You have low levels of a female hormone (estrogen). ? You are pregnant.  You have genes that add to your risk.  You are sexually active.  You take antibiotic medicines.  You have trouble peeing because of: ? A prostate that is bigger than normal, if you are female. ? A blockage in the part of your body that drains pee from the bladder (urethra). ? A kidney stone. ? A nerve condition that affects your bladder (neurogenic bladder). ? Not getting enough to drink. ? Not peeing often enough.  You have other conditions, such as: ? Diabetes. ? A weak disease-fighting system (immune system). ? Sickle cell disease. ? Gout. ? Injury of the spine. What are the signs or symptoms? Symptoms of this condition include:  Needing to pee right away (urgently).  Peeing often.  Peeing small amounts often.  Pain or burning when peeing.  Blood in the pee.  Pee that smells bad or not like normal.  Trouble peeing.  Pee that is cloudy.  Fluid coming from the vagina, if you are female.  Pain in the belly or lower back. Other symptoms include:  Throwing up (vomiting).  No urge to eat.  Feeling mixed up (confused).  Being tired  and grouchy (irritable).  A fever.  Watery poop (diarrhea). How is this treated? This condition may be treated with:  Antibiotic medicine.  Other medicines.  Drinking enough water. Follow these instructions at home:  Medicines  Take over-the-counter and prescription medicines only as told by your doctor.  If you were prescribed an antibiotic medicine, take it as told by your doctor. Do not stop taking it even if you start to feel better. General instructions  Make sure you: ? Pee until your bladder is empty. ? Do not hold pee for a long time. ? Empty your bladder after sex. ? Wipe from front to back after pooping if you are a female. Use each tissue one time when you wipe.  Drink enough fluid to keep your pee pale yellow.  Keep all follow-up visits as told by your doctor. This is important. Contact a doctor if:  You do not get better after 1-2 days.  Your symptoms go away and then come back. Get help right away if:  You have very bad back pain.  You have very bad pain in your lower belly.  You have a fever.  You are sick to your stomach (nauseous).  You are throwing up. Summary  A urinary tract infection (UTI) is an infection of any part of the urinary tract.  This condition is caused by germs in your genital area.  There are many risk factors for a UTI. These include having a small, thin   tube to drain pee and not being able to control when you pee or poop.  Treatment includes antibiotic medicines for germs.  Drink enough fluid to keep your pee pale yellow. This information is not intended to replace advice given to you by your health care provider. Make sure you discuss any questions you have with your health care provider. Document Released: 11/04/2007 Document Revised: 11/25/2017 Document Reviewed: 11/25/2017 Elsevier Interactive Patient Education  2019 [employer]vier Inc.  

## 2018-06-07 NOTE — Care Management Note (Signed)
Case Management Note  Patient Details  Name: Alice Hayes MRN: [id number] Date of Birth: 09-03-1922  Subjective/Objective: PT-recc HHPT-[facility] HHPT rep Ronalee Belts aware of d/c & return back to [facility]. TC [facility] rep-they cotnract with [facility]. No further CM needs.                   Action/Plan:dc w/HHC-HHTP @ ALF memory care.   Expected Discharge Date:  06/07/18               Expected Discharge Plan:  Assisted Living / Rest Home  In-House Referral:  Clinical Social Work  Discharge planning Services  CM Consult  Post Acute Care Choice:    Choice offered to:  NA  DME Arranged:    DME Agency:     HH Arranged:  PT HH Agency:  Other - See comment(facility contracts with [facility])  Status of Service:  Completed, signed off  If discussed at [place] of Stay Meetings, dates discussed:    Additional Comments:  Dessa Phi, RN 06/07/2018, 3:53 PM

## 2018-06-07 NOTE — Discharge Summary (Signed)
Physician Discharge Summary  Alice Hayes TLX:[id number] DOB: [birth year] DOA: 06/02/2018  PCP: Vernie Shanks, MD  Admit date: 06/02/2018 Discharge date: 06/07/2018  Admitted From: ALF, memory care Disposition:  ALF, memory care  Recommendations for Outpatient Follow-up:  1. Follow up with PCP in 1-2 weeks 2. Please obtain BMP/CBC in one week   Home Health: No Equipment/Devices: None  Discharge Condition: stable CODE STATUS: DNR Diet recommendation: Regular   Brief/Interim Summary:  #) Nausea/vomiting: Unclear etiology but this was attributed to UTI which was treated.  #) Acute delirium superimposed on dementia: Patient was admitted with delirium superimposed on dementia.  This was thought to be secondary to UTI and hospitalization.  This improved dramatically with treatment of UTI.  #) UTI: Patient's urine grew out pansensitive E. coli.  She was started on IV ceftriaxone and discharged home on oral amoxicillin to complete a total of 7 days.  #) Large pericardial effusion: Patient apparently has a known history of pericardial effusion.  An echo done here showed large pericardial effusion and concern for RV collapse.  Cardiology was consulted and discussed with the daughter and no aggressive interventions were planned. #) Hypothyroidism: Patient was continued on home [place] Thyroid 60 mg daily.  #) Pain/psych: Patient was continued on home citalopram.  Discharge Diagnoses:  Active Problems:   Hypothyroidism   Dementia (HCC)   Hyperlipidemia   Hypertension   Sepsis ([place])   Pyelonephritis   Dehydration   Intractable nausea and vomiting   Acute UTI   Pericardial effusion   Hypernatremia    Discharge Instructions  Discharge Instructions    Call MD for:  difficulty breathing, headache or visual disturbances   Complete by:  As directed    Call MD for:  hives   Complete by:  As directed    Call MD for:  persistant dizziness or light-headedness   Complete by:  As  directed    Call MD for:  persistant nausea and vomiting   Complete by:  As directed    Call MD for:  redness, tenderness, or signs of infection (pain, swelling, redness, odor or green/yellow discharge around incision site)   Complete by:  As directed    Call MD for:  severe uncontrolled pain   Complete by:  As directed    Call MD for:  temperature >100.4   Complete by:  As directed    Diet - low sodium heart healthy   Complete by:  As directed    Discharge instructions   Complete by:  As directed    Please take your antibiotics as prescribed.   Increase activity slowly   Complete by:  As directed      Allergies as of 06/07/2018      Reactions   Voltaren Ophthalmic [diclofenac] Other (See Comments)   Reaction not recalled by family ??      Medication List    STOP taking these medications   enoxaparin 30 MG/0.3ML injection Commonly known as:  LOVENOX     TAKE these medications   amoxicillin 875 MG tablet Commonly known as:  AMOXIL Take 1 tablet (875 mg total) by mouth 2 (two) times daily for 3 days.   aspirin 81 MG chewable tablet Chew 81 mg by mouth 2 (two) times daily.   bisacodyl 5 MG EC tablet Commonly known as:  DULCOLAX Take 1 tablet (5 mg total) by mouth daily as needed for moderate constipation.   CALCIUM 600+D3 600-200 MG-UNIT Tabs Generic drug:  Calcium  Carb-Cholecalciferol Take 1 tablet by mouth daily.   citalopram 20 MG tablet Commonly known as:  CELEXA Take 20 mg by mouth daily.   Co Q 10 10 MG Caps Take 10 mg by mouth daily.   HYDROcodone-acetaminophen 5-325 MG tablet Commonly known as:  NORCO Take 0.5 tablets by mouth every 6 (six) hours as needed for moderate pain or severe pain.   ipratropium-albuterol 0.5-2.5 (3) MG/3ML Soln Commonly known as:  DUONEB Take 3 mLs by nebulization 2 (two) times daily.   NP THYROID 60 MG tablet Generic drug:  thyroid Take 60 mg by mouth daily.   OVER THE COUNTER MEDICATION "ICAPS Multivitamin + Lutein  tablets: Take 1 tablet by mouth once a day   polyethylene glycol packet Commonly known as:  MIRALAX / GLYCOLAX Take 17 g by mouth daily.   senna-docusate 8.6-50 MG tablet Commonly known as:  Senokot-S Take 1 tablet by mouth at bedtime.   Vitamin D-3 25 MCG (1000 UT) Caps Take 1,000 Units by mouth daily.       Allergies  Allergen Reactions  . Voltaren Ophthalmic [Diclofenac] Other (See Comments)    Reaction not recalled by family ??    Consultations:  Cardiology   Procedures/Studies: Dg Acute Abd 2+v Abd (supine,erect,decub) + 1v Chest  Result Date: 06/02/2018 CLINICAL DATA:  Vomiting EXAM: DG ABDOMEN ACUTE W/ 1V CHEST COMPARISON:  12/02/2017, chest x-ray 04/06/2015 FINDINGS: Single view chest demonstrates cardiomegaly with vascular congestion. Probable small pleural effusions. Aortic atherosclerosis. No pneumothorax. Stable sclerotic lesion in the proximal left humerus. Supine and upright views of the abdomen demonstrate no free air beneath the diaphragm. Surgical clips in the right upper quadrant. Nonobstructed bowel-gas pattern. Extensive vascular calcifications. IMPRESSION: 1. Cardiomegaly with vascular congestion and probable trace pleural effusion 2. Nonobstructed bowel-gas pattern Electronically Signed   By: Donavan Foil M.D.   On: 06/02/2018 21:15    06/03/2018 echo:- Left ventricle: The cavity size was normal. There was mild focal basal hypertrophy of the septum. Systolic function was normal. The estimated ejection fraction was in the range of 60% to 65%. Wall motion was normal; there were no regional wall motion abnormalities. Doppler parameters are consistent with abnormal left ventricular relaxation (grade 1 diastolic dysfunction). Doppler parameters are consistent with high ventricular filling pressure. - Aortic valve: Trileaflet; moderately thickened, moderately calcified leaflets. Valve mobility was restricted. There was very mild stenosis.  There was trivial regurgitation. Peak velocity (S): 204 cm/s. Valve area (VTI): 1.82 cm^2. Valve area (Vmax): 1.55 cm^2. Valve area (Vmean): 1.48 cm^2. - Right atrium: There is RV diastolic collapse indicative of increased intrapericardial pressure along with dilated IVC. - Tricuspid valve: There was mild regurgitation. - Pulmonary arteries: Systolic pressure was mildly increased. PA peak pressure: 38 mm Hg (S). - Pericardium, extracardiac: A large pericardial effusion was identified circumferential to the heart (2.2 cm posterior lateral). Findings are concerning for increased intrapericardial pressure.  Subjective:   Discharge Exam: Vitals:   06/07/18 0600 06/07/18 1427  BP: (!) 155/85 (!) 146/61  Pulse: 70 67  Resp: 18   Temp: 98.1 F (36.7 C) 98.2 F (36.8 C)  SpO2: 94% 92%   Vitals:   06/06/18 1357 06/06/18 2059 06/07/18 0600 06/07/18 1427  BP: (!) 162/79 138/61 (!) 155/85 (!) 146/61  Pulse: 73 68 70 67  Resp: 18 18 18    Temp: 98.7 F (37.1 C) 98.9 F (37.2 C) 98.1 F (36.7 C) 98.2 F (36.8 C)  TempSrc: Oral Oral Oral Oral  SpO2: 95%  92% 94% 92%  Weight:      Height:        General exam: Lying in bed comfortably Respiratory system: Clear to auscultation. Respiratory effort normal. Cardiovascular system: Distant heart sounds, regular rate and rhythm, no murmurs Gastrointestinal system: Abdomen is nondistended, soft and nontender. No organomegaly or masses felt. Normal bowel sounds heard. Central nervous system: Alert but not oriented, grossly intact, moving all extremities Extremities: No lower extremity edema Skin: No rashes over visible skin Psychiatry: Unable to assess due to underlying dementia   The results of significant diagnostics from this hospitalization (including imaging, microbiology, ancillary and laboratory) are listed below for reference.     Microbiology: Recent Results (from the past 240 hour(s))  Urine Culture     Status:  Abnormal   Collection Time: 06/02/18  7:42 PM  Result Value Ref Range Status   Specimen Description   Final    URINE, CLEAN CATCH Performed at [facility], [place] [address], [place], [place] [address]    Special Requests   Final    NONE Performed at [facility], [place] [address], [place], [place] [address]    Culture >=100,000 COLONIES/mL ESCHERICHIA COLI (A)  Final   Report Status 06/05/2018 FINAL  Final   Organism ID, Bacteria ESCHERICHIA COLI (A)  Final      Susceptibility   Escherichia coli - MIC*    AMPICILLIN 8 SENSITIVE Sensitive     CEFAZOLIN <=4 SENSITIVE Sensitive     CEFTRIAXONE <=1 SENSITIVE Sensitive     CIPROFLOXACIN <=0.25 SENSITIVE Sensitive     GENTAMICIN <=1 SENSITIVE Sensitive     IMIPENEM <=0.25 SENSITIVE Sensitive     NITROFURANTOIN <=16 SENSITIVE Sensitive     TRIMETH/SULFA <=20 SENSITIVE Sensitive     AMPICILLIN/SULBACTAM 4 SENSITIVE Sensitive     PIP/TAZO <=4 SENSITIVE Sensitive     Extended ESBL NEGATIVE Sensitive     * >=100,000 COLONIES/mL ESCHERICHIA COLI  Culture, blood (x 2)     Status: None (Preliminary result)   Collection Time: 06/03/18 12:20 AM  Result Value Ref Range Status   Specimen Description   Final    BLOOD LEFT ARM Performed at [facility], [address], [place], [place] [address]    Special Requests   Final    BOTTLES DRAWN AEROBIC AND ANAEROBIC Blood Culture adequate volume Performed at [place] [address] [place], [place] [address]    Culture   Final    NO GROWTH 4 DAYS Performed at [facility], [place] [address] [place], [place] [address]    Report Status PENDING  Incomplete  Culture, blood (x 2)     Status: None (Preliminary result)   Collection Time: 06/03/18 12:20 AM  Result Value Ref Range Status   Specimen Description   Final    BLOOD RIGHT HAND Performed at [facility], [place] [address], [place], [place] [address]    Special  Requests   Final    BOTTLES DRAWN AEROBIC AND ANAEROBIC Blood Culture adequate volume Performed at [place] [address], [place], [place] [address]    Culture   Final    NO GROWTH 4 DAYS Performed at [facility], [place] [address], [place], [place] [address]    Report Status PENDING  Incomplete  MRSA PCR Screening     Status: None   Collection Time: 06/03/18  8:40 AM  Result Value Ref Range  Status   MRSA by PCR NEGATIVE NEGATIVE Final    Comment:        The GeneXpert MRSA Assay (FDA approved for NASAL specimens only), is one component of a comprehensive MRSA colonization surveillance program. It is not intended to diagnose MRSA infection nor to guide or monitor treatment for MRSA infections. Performed at [facility], [place] [address], [place], [place] [address]      Labs: BNP (last 3 results) Recent Labs    06/03/18 0020  BNP 833.8*   Basic Metabolic Panel: Recent Labs  Lab 06/02/18 1720 06/03/18 0602 06/04/18 0414 06/05/18 0502 06/06/18 0440  NA 139 138 148* 145 144  K 5.5* 3.4* 3.6 3.0* 4.1  CL 103 108 113* 107 104  CO2 24 22 28 30 30   GLUCOSE 145* 101* 95 128* 106*  BUN 19 14 9 9  7*  CREATININE 0.94 0.79 0.69 0.66 0.59  CALCIUM 8.8* 7.5* 7.7* 7.9* 8.1*  MG  --  2.0 2.2  --  2.3  PHOS  --  3.0  --   --   --    Liver Function Tests: Recent Labs  Lab 06/02/18 1720 06/03/18 0602  AST 38 18  ALT 16 13  ALKPHOS 49 39  BILITOT 1.6* 1.0  PROT 6.5 5.7*  ALBUMIN 3.9 3.1*   Recent Labs  Lab 06/02/18 1720  LIPASE 33   No results for input(s): AMMONIA in the last 168 hours. CBC: Recent Labs  Lab 06/02/18 1720 06/03/18 0602 06/04/18 0414 06/05/18 0502  WBC 18.1* 10.5 7.5 10.1  NEUTROABS  --   --  5.9 7.5  HGB 15.2* 12.4 11.6* 12.5  HCT 47.1* 39.2 38.1 39.9  MCV 96.9 97.8 99.5 99.3  PLT 265 216 201 207   Cardiac Enzymes: Recent Labs  Lab 06/03/18 0020 06/03/18 0602 06/03/18 1222  TROPONINI  0.03* 0.03* 0.05*   BNP: Invalid input(s): POCBNP CBG: No results for input(s): GLUCAP in the last 168 hours. D-Dimer No results for input(s): DDIMER in the last 72 hours. Hgb A1c No results for input(s): HGBA1C in the last 72 hours. Lipid Profile No results for input(s): CHOL, HDL, LDLCALC, TRIG, CHOLHDL, LDLDIRECT in the last 72 hours. Thyroid function studies No results for input(s): TSH, T4TOTAL, T3FREE, THYROIDAB in the last 72 hours.  Invalid input(s): FREET3 Anemia work up No results for input(s): VITAMINB12, FOLATE, FERRITIN, TIBC, IRON, RETICCTPCT in the last 72 hours. Urinalysis    Component Value Date/Time   COLORURINE YELLOW 06/02/2018 1942   APPEARANCEUR HAZY (A) 06/02/2018 1942   LABSPEC 1.014 06/02/2018 1942   PHURINE 6.0 06/02/2018 1942   GLUCOSEU NEGATIVE 06/02/2018 1942   HGBUR SMALL (A) 06/02/2018 1942   BILIRUBINUR NEGATIVE 06/02/2018 1942   KETONESUR NEGATIVE 06/02/2018 1942   PROTEINUR 30 (A) 06/02/2018 1942   UROBILINOGEN 0.2 04/06/2015 1722   NITRITE POSITIVE (A) 06/02/2018 1942   LEUKOCYTESUR LARGE (A) 06/02/2018 1942   Sepsis Labs Invalid input(s): PROCALCITONIN,  WBC,  LACTICIDVEN Microbiology Recent Results (from the past 240 hour(s))  Urine Culture     Status: Abnormal   Collection Time: 06/02/18  7:42 PM  Result Value Ref Range Status   Specimen Description   Final    URINE, CLEAN CATCH Performed at [facility], [place] [address], [place], [place] [address]    Special Requests   Final    NONE Performed at [facility], [place] [address], [place], [place] [address]    Culture >=  100,000 COLONIES/mL ESCHERICHIA COLI (A)  Final   Report Status 06/05/2018 FINAL  Final   Organism ID, Bacteria ESCHERICHIA COLI (A)  Final      Susceptibility   Escherichia coli - MIC*    AMPICILLIN 8 SENSITIVE Sensitive     CEFAZOLIN <=4 SENSITIVE Sensitive     CEFTRIAXONE <=1 SENSITIVE Sensitive     CIPROFLOXACIN <=0.25  SENSITIVE Sensitive     GENTAMICIN <=1 SENSITIVE Sensitive     IMIPENEM <=0.25 SENSITIVE Sensitive     NITROFURANTOIN <=16 SENSITIVE Sensitive     TRIMETH/SULFA <=20 SENSITIVE Sensitive     AMPICILLIN/SULBACTAM 4 SENSITIVE Sensitive     PIP/TAZO <=4 SENSITIVE Sensitive     Extended ESBL NEGATIVE Sensitive     * >=100,000 COLONIES/mL ESCHERICHIA COLI  Culture, blood (x 2)     Status: None (Preliminary result)   Collection Time: 06/03/18 12:20 AM  Result Value Ref Range Status   Specimen Description   Final    BLOOD LEFT ARM Performed at [facility], [address], [place], [place] [address]    Special Requests   Final    BOTTLES DRAWN AEROBIC AND ANAEROBIC Blood Culture adequate volume Performed at [place] [address], [place], [place] [address]    Culture   Final    NO GROWTH 4 DAYS Performed at [facility], [place] [address], [place], [place] [address]    Report Status PENDING  Incomplete  Culture, blood (x 2)     Status: None (Preliminary result)   Collection Time: 06/03/18 12:20 AM  Result Value Ref Range Status   Specimen Description   Final    BLOOD RIGHT HAND Performed at [facility], [place] [address] [place], [place] [address]    Special Requests   Final    BOTTLES DRAWN AEROBIC AND ANAEROBIC Blood Culture adequate volume Performed at [place] [address], [place], [place] [address]    Culture   Final    NO GROWTH 4 DAYS Performed at [facility], [place] [address] [place], [place] [address]    Report Status PENDING  Incomplete  MRSA PCR Screening     Status: None   Collection Time: 06/03/18  8:40 AM  Result Value Ref Range Status   MRSA by PCR NEGATIVE NEGATIVE Final    Comment:        The GeneXpert MRSA Assay (FDA approved for NASAL specimens only), is one component of a comprehensive MRSA colonization surveillance program. It is not intended to diagnose MRSA infection nor to  guide or monitor treatment for MRSA infections. Performed at [facility], [place] [address], [place], [place] [address]      Time coordinating discharge: 52  SIGNED:   Cristy Folks, MD  [facility] 06/07/2018, 2:41 PM  If 7PM-7AM, please contact night-coverage [email or web address] Password [email or web address]

## 2018-06-07 NOTE — Care Management Note (Signed)
Case Management Note  Patient Details  Name: Alice Hayes MRN: [id number] Date of Birth: [birth year]  Subjective/Objective: Hyperthyroidism. From ALF-[facility] memory care.To return to ALF-CSW managing.                   Action/Plan:dc ALF-memory care   Expected Discharge Date:  06/07/18               Expected Discharge Plan:  Assisted Living / Rest Home  In-House Referral:  Clinical Social Work  Discharge planning Services  CM Consult  Post Acute Care Choice:    Choice offered to:     DME Arranged:    DME Agency:     HH Arranged:    [place] Agency:     Status of Service:  Completed, signed off  If discussed at [employer], dates discussed:    Additional Comments:  Dessa Phi, RN 06/07/2018, 3:43 PM

## 2018-06-07 NOTE — Progress Notes (Signed)
Administered last dose of iv ABX and also gave evening medications, family at bedside, will dress patient and put a brief on her after iv site is removed, d/c legwork completed by previous shift nurse, awaiting transportation to facility, vss, no complaints at this time, will continue to monitor.

## 2018-06-07 NOTE — Progress Notes (Signed)
Clinical Social Worker following patient for support and discharge needs. CSW has faxed over all clinical information requested by patient facility ([facility]). Facility stated they will be able to take patient back and are abel to meet her needs. CSW contacted patients daughters and left voicemail for them to contact CSW back if they had concerns about discharge plans. CSW will arrange transportation back to [facility] via [facility]. RN to please contact [phone] for report.    Rhea Pink, MSW,  [place]

## 2018-06-07 NOTE — Progress Notes (Signed)
PROGRESS NOTE    Alice Hayes  YQM:[id number] DOB: 08-May-1923 DOA: 06/02/2018 PCP: Vernie Shanks, MD    Brief Narrative:  85 year old with past medical history relevant for dementia, hypothyroidism, hypertension, hyperlipidemia admitted with nausea and vomiting and found to have UTI as well as large pericardial effusion.   Assessment & Plan:   Active Problems:   Hypothyroidism   Dementia (HCC)   Hyperlipidemia   Hypertension   Sepsis ([place])   Pyelonephritis   Dehydration   Intractable nausea and vomiting   Acute UTI   Pericardial effusion   Hypernatremia   #) Acute delirium superimposed on dementia: This appears to be almost completely resolved with treatment of UTI. - Delirium precautions -Pending placement in skilled nursing facility  #) UTI: Growing pansensitive E. coli -Continue ceftriaxone started 06/03/2018, will finish out 5-day course on 06/08/2018  #) Large pericardial effusion: Per review of the chart patient apparently has a known history per the daughter of a large pericardial effusion that was being monitored.  Echo today showed that there was some evidence of RV collapse.  Cardiology was discussed with the family and they are currently not interested in any invasive evaluation. -Cardiology has signed off  #) Hypothyroidism: -Continue [place] Thyroid 60 mg daily  #) Pain/psych: -Continue citalopram 20 mg daily  Fluids: Tolerating p.o. Electrolytes: Monitor and supplement Nutrition: Regular diet   Prophylaxis: Enoxaparin   Disposition: Pending 24 hours with a telemetry sitter  DO NOT RESUSCITATE  Consultants:   Cardiology  Procedures: 06/03/2018 echo:- Left ventricle: The cavity size was normal. There was mild focal   basal hypertrophy of the septum. Systolic function was normal.   The estimated ejection fraction was in the range of 60% to 65%.   Wall motion was normal; there were no regional wall motion   abnormalities. Doppler parameters are  consistent with abnormal   left ventricular relaxation (grade 1 diastolic dysfunction).   Doppler parameters are consistent with high ventricular filling   pressure. - Aortic valve: Trileaflet; moderately thickened, moderately   calcified leaflets. Valve mobility was restricted. There was very   mild stenosis. There was trivial regurgitation. Peak velocity   (S): 204 cm/s. Valve area (VTI): 1.82 cm^2. Valve area (Vmax):   1.55 cm^2. Valve area (Vmean): 1.48 cm^2. - Right atrium: There is RV diastolic collapse indicative of   increased intrapericardial pressure along with dilated IVC. - Tricuspid valve: There was mild regurgitation. - Pulmonary arteries: Systolic pressure was mildly increased. PA   peak pressure: 38 mm Hg (S). - Pericardium, extracardiac: A large pericardial effusion was   identified circumferential to the heart (2.2 cm posterior   lateral). Findings are concerning for increased intrapericardial   pressure.    Antimicrobials:   IV ceftriaxone started 06/03/2018   Subjective: This morning the patient is awake and able to speak.  She does not have any complaints.  She denies any nausea, vomiting, diarrhea.  She is much more alert and oriented.  Objective: Vitals:   06/06/18 0644 06/06/18 1357 06/06/18 2059 06/07/18 0600  BP: (!) 155/78 (!) 162/79 138/61 (!) 155/85  Pulse: 84 73 68 70  Resp:  18 18 18   Temp:  98.7 F (37.1 C) 98.9 F (37.2 C) 98.1 F (36.7 C)  TempSrc:  Oral Oral Oral  SpO2:  95% 92% 94%  Weight:      Height:        Intake/Output Summary (Last 24 hours) at 06/07/2018 1145 Last data filed at 06/07/2018  1000 Gross per 24 hour  Intake 860 ml  Output 1150 ml  Net -290 ml   Filed Weights   06/02/18 1703  Weight: 54.4 kg    Examination:  General exam: Lying in bed comfortably Respiratory system: Clear to auscultation. Respiratory effort normal. Cardiovascular system: Distant heart sounds, regular rate and rhythm, no  murmurs Gastrointestinal system: Abdomen is nondistended, soft and nontender. No organomegaly or masses felt. Normal bowel sounds heard. Central nervous system: Alert but not oriented, grossly intact, moving all extremities Extremities: No lower extremity edema Skin: No rashes over visible skin Psychiatry: Unable to assess due to underlying dementia    Data Reviewed: I have personally reviewed following labs and imaging studies  CBC: Recent Labs  Lab 06/02/18 1720 06/03/18 0602 06/04/18 0414 06/05/18 0502  WBC 18.1* 10.5 7.5 10.1  NEUTROABS  --   --  5.9 7.5  HGB 15.2* 12.4 11.6* 12.5  HCT 47.1* 39.2 38.1 39.9  MCV 96.9 97.8 99.5 99.3  PLT 265 216 201 185   Basic Metabolic Panel: Recent Labs  Lab 06/02/18 1720 06/03/18 0602 06/04/18 0414 06/05/18 0502 06/06/18 0440  NA 139 138 148* 145 144  K 5.5* 3.4* 3.6 3.0* 4.1  CL 103 108 113* 107 104  CO2 24 22 28 30 30   GLUCOSE 145* 101* 95 128* 106*  BUN 19 14 9 9  7*  CREATININE 0.94 0.79 0.69 0.66 0.59  CALCIUM 8.8* 7.5* 7.7* 7.9* 8.1*  MG  --  2.0 2.2  --  2.3  PHOS  --  3.0  --   --   --    GFR: Estimated Creatinine Clearance: 33.1 mL/min (by C-G formula based on SCr of 0.59 mg/dL). Liver Function Tests: Recent Labs  Lab 06/02/18 1720 06/03/18 0602  AST 38 18  ALT 16 13  ALKPHOS 49 39  BILITOT 1.6* 1.0  PROT 6.5 5.7*  ALBUMIN 3.9 3.1*   Recent Labs  Lab 06/02/18 1720  LIPASE 33   No results for input(s): AMMONIA in the last 168 hours. Coagulation Profile: No results for input(s): INR, PROTIME in the last 168 hours. Cardiac Enzymes: Recent Labs  Lab 06/03/18 0020 06/03/18 0602 06/03/18 1222  TROPONINI 0.03* 0.03* 0.05*   BNP (last 3 results) No results for input(s): PROBNP in the last 8760 hours. HbA1C: No results for input(s): HGBA1C in the last 72 hours. CBG: No results for input(s): GLUCAP in the last 168 hours. Lipid Profile: No results for input(s): CHOL, HDL, LDLCALC, TRIG, CHOLHDL,  LDLDIRECT in the last 72 hours. Thyroid Function Tests: No results for input(s): TSH, T4TOTAL, FREET4, T3FREE, THYROIDAB in the last 72 hours. Anemia Panel: No results for input(s): VITAMINB12, FOLATE, FERRITIN, TIBC, IRON, RETICCTPCT in the last 72 hours. Sepsis Labs: Recent Labs  Lab 06/03/18 0020 06/03/18 0348  PROCALCITON 0.12  --   LATICACIDVEN 2.4* 1.1    Recent Results (from the past 240 hour(s))  Urine Culture     Status: Abnormal   Collection Time: 06/02/18  7:42 PM  Result Value Ref Range Status   Specimen Description   Final    URINE, CLEAN CATCH Performed at [facility], [place] [address], [place], [place] [address]    Special Requests   Final    NONE Performed at [facility] [place] [address], [place], [place] [address]    Culture >=100,000 COLONIES/mL ESCHERICHIA COLI (A)  Final   Report Status 06/05/2018 FINAL  Final   Organism ID, Bacteria  ESCHERICHIA COLI (A)  Final      Susceptibility   Escherichia coli - MIC*    AMPICILLIN 8 SENSITIVE Sensitive     CEFAZOLIN <=4 SENSITIVE Sensitive     CEFTRIAXONE <=1 SENSITIVE Sensitive     CIPROFLOXACIN <=0.25 SENSITIVE Sensitive     GENTAMICIN <=1 SENSITIVE Sensitive     IMIPENEM <=0.25 SENSITIVE Sensitive     NITROFURANTOIN <=16 SENSITIVE Sensitive     TRIMETH/SULFA <=20 SENSITIVE Sensitive     AMPICILLIN/SULBACTAM 4 SENSITIVE Sensitive     PIP/TAZO <=4 SENSITIVE Sensitive     Extended ESBL NEGATIVE Sensitive     * >=100,000 COLONIES/mL ESCHERICHIA COLI  Culture, blood (x 2)     Status: None (Preliminary result)   Collection Time: 06/03/18 12:20 AM  Result Value Ref Range Status   Specimen Description   Final    BLOOD LEFT ARM Performed at [facility], [place] [address] [place], [place] [address]    Special Requests   Final    BOTTLES DRAWN AEROBIC AND ANAEROBIC Blood Culture adequate volume Performed at [place] [address] [place],  [place] [address]    Culture   Final    NO GROWTH 4 DAYS Performed at [facility], [place] [address], [place], [place] [address]    Report Status PENDING  Incomplete  Culture, blood (x 2)     Status: None (Preliminary result)   Collection Time: 06/03/18 12:20 AM  Result Value Ref Range Status   Specimen Description   Final    BLOOD RIGHT HAND Performed at [facility], [place] [address], [place], [place] [address]    Special Requests   Final    BOTTLES DRAWN AEROBIC AND ANAEROBIC Blood Culture adequate volume Performed at [place] [address] [place], [place] [address]    Culture   Final    NO GROWTH 4 DAYS Performed at [facility], [place] [address], [place], [place] [address]    Report Status PENDING  Incomplete  MRSA PCR Screening     Status: None   Collection Time: 06/03/18  8:40 AM  Result Value Ref Range Status   MRSA by PCR NEGATIVE NEGATIVE Final    Comment:        The GeneXpert MRSA Assay (FDA approved for NASAL specimens only), is one component of a comprehensive MRSA colonization surveillance program. It is not intended to diagnose MRSA infection nor to guide or monitor treatment for MRSA infections. Performed at [facility], [place] [address], [place], [place] [address]          Radiology Studies: No results found.      Scheduled Meds: . aspirin  81 mg Oral BID  . citalopram  20 mg Oral Daily  . enoxaparin (LOVENOX) injection  30 mg Subcutaneous QHS  . senna-docusate  1 tablet Oral BID  . thyroid  60 mg Oral Daily   Continuous Infusions: . sodium chloride 500 mL (06/06/18 1904)  . cefTRIAXone (ROCEPHIN)  IV Stopped (06/07/18 0027)     LOS: 4 days    Time spent: [place], MD [facility]  If 7PM-7AM, please contact night-coverage [email or web address] Password [email or web address] 06/07/2018, 11:45 AM

## 2018-06-07 NOTE — Progress Notes (Signed)
Electronic script was sent for the family to pick up per [facility] request. Family informed.

## 2018-06-07 NOTE — Progress Notes (Signed)
Physical Therapy Treatment Patient Details Name: Alice Hayes MRN: [id number] DOB: 1923-05-31 Today's Date: 06/07/2018    History of Present Illness 85 y.o. female with medical history significant of HL,; HTN, diastolic heart failure, COPD, CAD, dementia, hypothyroidism and admitted for questionable Pyelonephritis vs Pyuria      PT Comments    Pt agreeable to mobilize and able to ambulate 80 feet with RW in hallway.  Pt currently min assist for mobility at this time.  Granddaughter, Shirlean Mylar, present today and reports pt was ambulating to/from dining hall at memory care unit at baseline.  Pt does not typically wear oxygen however SPO2 on room air decreased to 87% upon ambulating so 2L O2 [place] reapplied.      Follow Up Recommendations  Home health PT;Supervision/Assistance - 24 hour(if ALF able to provide current level of care, if not then SNF)     Equipment Recommendations  None recommended by PT    Recommendations for Other Services       Precautions / Restrictions Precautions Precautions: Fall Precaution Comments: HOH, monitor sats    Mobility  Bed Mobility Overal bed mobility: Needs Assistance Bed Mobility: Supine to Sit;Sit to Supine     Supine to sit: Min guard Sit to supine: Min guard   General bed mobility comments: increased time and effort, min/guard for safety  Transfers Overall transfer level: Needs assistance Equipment used: Rolling walker (2 wheeled) Transfers: Sit to/from Stand Sit to Stand: Min assist         General transfer comment: verbal cues for safe technique, assist to rise and steady  Ambulation/Gait Ambulation/Gait assistance: Min guard Gait Distance (Feet): 80 Feet Assistive device: Rolling walker (2 wheeled) Gait Pattern/deviations: Step-through pattern;Trunk flexed;Decreased stride length     General Gait Details: verbal cues for posture and RW positioning, distance to tolerance, SpO2 87% room air upon returning to bed so 2L O2 [place]  reapplied   Stairs             Wheelchair Mobility    Modified Rankin (Stroke Patients Only)       Balance Overall balance assessment: Needs assistance;History of Falls                                          Cognition Arousal/Alertness: Awake/alert Behavior During Therapy: WFL for tasks assessed/performed Overall Cognitive Status: History of cognitive impairments - at baseline                                 General Comments: hx of dementia      Exercises      General Comments        Pertinent Vitals/Pain Pain Assessment: Faces Faces Pain Scale: No hurt Pain Intervention(s): Monitored during session    Home Living                      Prior Function            PT Goals (current goals can now be found in the care plan section) Progress towards PT goals: Progressing toward goals    Frequency    Min 2X/week      PT Plan Current plan remains appropriate    Co-evaluation              AM-PAC PT "6 Clicks"  Mobility   Outcome Measure  Help needed turning from your back to your side while in a flat bed without using bedrails?: A Little Help needed moving from lying on your back to sitting on the side of a flat bed without using bedrails?: A Little Help needed moving to and from a bed to a chair (including a wheelchair)?: A Little Help needed standing up from a chair using your arms (e.g., wheelchair or bedside chair)?: A Little Help needed to walk in hospital room?: A Little Help needed climbing 3-5 steps with a railing? : A Lot 6 Click Score: 17    End of Session Equipment Utilized During Treatment: Gait belt;Oxygen Activity Tolerance: Patient tolerated treatment well Patient left: in bed;with call bell/phone within reach;with bed alarm set;with family/visitor present Nurse Communication: Mobility status PT Visit Diagnosis: Other abnormalities of gait and mobility (R26.89)     Time:  5397-6734 PT Time Calculation (min) (ACUTE ONLY): 20 min  Charges:  $Gait Training: 8-22 mins                     Carmelia Bake, PT, DPT Acute Rehabilitation Services Office: [phone] Pager: [phone]  Trena Platt 06/07/2018, 4:25 PM

## 2018-06-07 NOTE — Progress Notes (Signed)
Patient discharged via ems, family took all belongings with them to [facility], iv site removed earlier and pressure dressing remains dry, brief placed on after purewick was dc'ed, patient voices no complaints of pain and cognitive status remains at baseline (alert to person only), discharge summary and transfer ems report printed and sent with ems, hospitalist (np/pa) paged to sign DNR form as this was not completed and needed done prior to the patient being transported, came to floor and form was signed per protocol.

## 2018-06-08 ENCOUNTER — Encounter (HOSPITAL_COMMUNITY): Payer: Self-pay | Admitting: Emergency Medicine

## 2018-06-08 ENCOUNTER — Emergency Department (HOSPITAL_COMMUNITY): Payer: Medicare Other

## 2018-06-08 ENCOUNTER — Observation Stay (HOSPITAL_COMMUNITY): Payer: Medicare Other

## 2018-06-08 ENCOUNTER — Other Ambulatory Visit: Payer: Self-pay

## 2018-06-08 ENCOUNTER — Observation Stay (HOSPITAL_COMMUNITY)
Admission: EM | Admit: 2018-06-08 | Discharge: 2018-06-09 | Disposition: A | Discharge status: Nursing Home (Includes ICF) | Payer: Medicare Other | Attending: Internal Medicine | Admitting: Internal Medicine

## 2018-06-08 DIAGNOSIS — W19XXXA Unspecified fall, initial encounter: Secondary | ICD-10-CM | POA: Diagnosis not present

## 2018-06-08 DIAGNOSIS — E785 Hyperlipidemia, unspecified: Secondary | ICD-10-CM | POA: Diagnosis not present

## 2018-06-08 DIAGNOSIS — I700 Atherosclerosis of aorta: Secondary | ICD-10-CM | POA: Insufficient documentation

## 2018-06-08 DIAGNOSIS — J90 Pleural effusion, not elsewhere classified: Secondary | ICD-10-CM | POA: Insufficient documentation

## 2018-06-08 DIAGNOSIS — I5032 Chronic diastolic (congestive) heart failure: Secondary | ICD-10-CM | POA: Insufficient documentation

## 2018-06-08 DIAGNOSIS — J189 Pneumonia, unspecified organism: Secondary | ICD-10-CM | POA: Diagnosis not present

## 2018-06-08 DIAGNOSIS — I2510 Atherosclerotic heart disease of native coronary artery without angina pectoris: Secondary | ICD-10-CM | POA: Diagnosis not present

## 2018-06-08 DIAGNOSIS — I313 Pericardial effusion (noninflammatory): Secondary | ICD-10-CM | POA: Diagnosis not present

## 2018-06-08 DIAGNOSIS — I10 Essential (primary) hypertension: Secondary | ICD-10-CM | POA: Diagnosis present

## 2018-06-08 DIAGNOSIS — Z85820 Personal history of malignant melanoma of skin: Secondary | ICD-10-CM | POA: Diagnosis not present

## 2018-06-08 DIAGNOSIS — S299XXA Unspecified injury of thorax, initial encounter: Secondary | ICD-10-CM | POA: Diagnosis not present

## 2018-06-08 DIAGNOSIS — S61411A Laceration without foreign body of right hand, initial encounter: Secondary | ICD-10-CM

## 2018-06-08 DIAGNOSIS — Z9049 Acquired absence of other specified parts of digestive tract: Secondary | ICD-10-CM | POA: Insufficient documentation

## 2018-06-08 DIAGNOSIS — I3139 Other pericardial effusion (noninflammatory): Secondary | ICD-10-CM | POA: Diagnosis present

## 2018-06-08 DIAGNOSIS — R110 Nausea: Secondary | ICD-10-CM | POA: Diagnosis not present

## 2018-06-08 DIAGNOSIS — I4510 Unspecified right bundle-branch block: Secondary | ICD-10-CM | POA: Diagnosis not present

## 2018-06-08 DIAGNOSIS — K769 Liver disease, unspecified: Secondary | ICD-10-CM | POA: Diagnosis not present

## 2018-06-08 DIAGNOSIS — R0902 Hypoxemia: Secondary | ICD-10-CM | POA: Diagnosis present

## 2018-06-08 DIAGNOSIS — J440 Chronic obstructive pulmonary disease with acute lower respiratory infection: Secondary | ICD-10-CM | POA: Insufficient documentation

## 2018-06-08 DIAGNOSIS — M810 Age-related osteoporosis without current pathological fracture: Secondary | ICD-10-CM | POA: Insufficient documentation

## 2018-06-08 DIAGNOSIS — E039 Hypothyroidism, unspecified: Secondary | ICD-10-CM | POA: Diagnosis not present

## 2018-06-08 DIAGNOSIS — F0390 Unspecified dementia without behavioral disturbance: Secondary | ICD-10-CM | POA: Diagnosis present

## 2018-06-08 DIAGNOSIS — R42 Dizziness and giddiness: Secondary | ICD-10-CM | POA: Diagnosis not present

## 2018-06-08 DIAGNOSIS — S0990XA Unspecified injury of head, initial encounter: Secondary | ICD-10-CM | POA: Diagnosis not present

## 2018-06-08 DIAGNOSIS — Z79899 Other long term (current) drug therapy: Secondary | ICD-10-CM | POA: Insufficient documentation

## 2018-06-08 DIAGNOSIS — J9611 Chronic respiratory failure with hypoxia: Secondary | ICD-10-CM | POA: Diagnosis not present

## 2018-06-08 DIAGNOSIS — Z79890 Hormone replacement therapy (postmenopausal): Secondary | ICD-10-CM | POA: Insufficient documentation

## 2018-06-08 DIAGNOSIS — S199XXA Unspecified injury of neck, initial encounter: Secondary | ICD-10-CM | POA: Diagnosis not present

## 2018-06-08 DIAGNOSIS — I110 Hypertensive heart disease with heart failure: Secondary | ICD-10-CM | POA: Insufficient documentation

## 2018-06-08 DIAGNOSIS — Z7982 Long term (current) use of aspirin: Secondary | ICD-10-CM | POA: Insufficient documentation

## 2018-06-08 DIAGNOSIS — M50322 Other cervical disc degeneration at C5-C6 level: Secondary | ICD-10-CM | POA: Insufficient documentation

## 2018-06-08 DIAGNOSIS — N390 Urinary tract infection, site not specified: Secondary | ICD-10-CM | POA: Diagnosis not present

## 2018-06-08 DIAGNOSIS — J181 Lobar pneumonia, unspecified organism: Secondary | ICD-10-CM

## 2018-06-08 DIAGNOSIS — J398 Other specified diseases of upper respiratory tract: Secondary | ICD-10-CM | POA: Insufficient documentation

## 2018-06-08 DIAGNOSIS — R1111 Vomiting without nausea: Secondary | ICD-10-CM | POA: Diagnosis not present

## 2018-06-08 DIAGNOSIS — M1990 Unspecified osteoarthritis, unspecified site: Secondary | ICD-10-CM | POA: Insufficient documentation

## 2018-06-08 DIAGNOSIS — Z66 Do not resuscitate: Secondary | ICD-10-CM | POA: Insufficient documentation

## 2018-06-08 DIAGNOSIS — Z8249 Family history of ischemic heart disease and other diseases of the circulatory system: Secondary | ICD-10-CM | POA: Insufficient documentation

## 2018-06-08 LAB — BASIC METABOLIC PANEL
Anion gap: 9 (ref 5–15)
BUN: 8 mg/dL (ref 8–23)
CO2: 30 mmol/L (ref 22–32)
CREATININE: 0.64 mg/dL (ref 0.44–1.00)
Calcium: 8.6 mg/dL — ABNORMAL LOW (ref 8.9–10.3)
Chloride: 103 mmol/L (ref 98–111)
GFR calc Af Amer: >60 mL/min (ref >60)
GFR calc non Af Amer: >60 mL/min (ref >60)
Glucose, Bld: 134 mg/dL — ABNORMAL HIGH (ref 70–99)
Potassium: 3.1 mmol/L — ABNORMAL LOW (ref 3.5–5.1)
Sodium: 142 mmol/L (ref 135–145)

## 2018-06-08 LAB — CBC WITH DIFFERENTIAL/PLATELET
Abs Immature Granulocytes: 0.03 10*3/uL (ref 0.00–0.07)
Basophils Absolute: 0.1 10*3/uL (ref 0.0–0.1)
Basophils Relative: 1 %
Eosinophils Absolute: 0.3 10*3/uL (ref 0.0–0.5)
Eosinophils Relative: 4 %
HCT: 44.6 % (ref 36.0–46.0)
Hemoglobin: 13.9 g/dL (ref 12.0–15.0)
IMMATURE GRANULOCYTES: 0 %
LYMPHS PCT: 15 %
Lymphs Abs: 1.1 10*3/uL (ref 0.7–4.0)
MCH: 29.6 pg (ref 26.0–34.0)
MCHC: 31.2 g/dL (ref 30.0–36.0)
MCV: 94.9 fL (ref 80.0–100.0)
Monocytes Absolute: 0.5 10*3/uL (ref 0.1–1.0)
Monocytes Relative: 7 %
Neutro Abs: 5.4 10*3/uL (ref 1.7–7.7)
Neutrophils Relative %: 73 %
Platelets: 261 10*3/uL (ref 150–400)
RBC: 4.7 MIL/uL (ref 3.87–5.11)
RDW: 16.4 % — ABNORMAL HIGH (ref 11.5–15.5)
WBC: 7.3 10*3/uL (ref 4.0–10.5)
nRBC: 0 % (ref 0.0–0.2)

## 2018-06-08 LAB — CULTURE, BLOOD (ROUTINE X 2)
Culture: NO GROWTH
Culture: NO GROWTH
Special Requests: ADEQUATE
Special Requests: ADEQUATE

## 2018-06-08 LAB — I-STAT CG4 LACTIC ACID, ED: Lactic Acid, Venous: 1.36 mmol/L (ref 0.5–1.9)

## 2018-06-08 MED ORDER — AMOXICILLIN 875 MG PO TABS: 875.0000 mg | ORAL_TABLET | Freq: Two times a day (BID) | ORAL | Status: DC

## 2018-06-08 MED ORDER — AMOXICILLIN 500 MG PO CAPS
500.0000 mg | ORAL_CAPSULE | Freq: Three times a day (TID) | ORAL | Status: DC
  Administered 2018-06-08 – 2018-06-09 (×6): 500 mg via ORAL
  Filled 2018-06-08 (×7): qty 1

## 2018-06-08 MED ORDER — CITALOPRAM HYDROBROMIDE 20 MG PO TABS
20.0000 mg | ORAL_TABLET | Freq: Every day | ORAL | Status: DC
  Administered 2018-06-08 – 2018-06-09 (×2): 20 mg via ORAL
  Filled 2018-06-08: qty 1
  Filled 2018-06-08: qty 2

## 2018-06-08 MED ORDER — CO Q 10 10 MG PO CAPS: 10.0000 mg | ORAL_CAPSULE | Freq: Every day | ORAL | Status: DC

## 2018-06-08 MED ORDER — CALCIUM CARB-CHOLECALCIFEROL 600-200 MG-UNIT PO TABS: 1.0000 | ORAL_TABLET | Freq: Every day | ORAL | Status: DC

## 2018-06-08 MED ORDER — BISACODYL 5 MG PO TBEC
5.0000 mg | DELAYED_RELEASE_TABLET | Freq: Every day | ORAL | Status: DC | PRN
  Administered 2018-06-08: 5 mg via ORAL
  Filled 2018-06-08: qty 1

## 2018-06-08 MED ORDER — VITAMIN D-3 25 MCG (1000 UT) PO CAPS: 1000.0000 [IU] | ORAL_CAPSULE | Freq: Every day | ORAL | Status: DC

## 2018-06-08 MED ORDER — SODIUM CHLORIDE 0.9 % IV SOLN
INTRAVENOUS | Status: DC | PRN
  Administered 2018-06-08: 500 mL via INTRAVENOUS

## 2018-06-08 MED ORDER — ACETAMINOPHEN 325 MG PO TABS: 650.0000 mg | ORAL_TABLET | Freq: Four times a day (QID) | ORAL | Status: DC | PRN

## 2018-06-08 MED ORDER — POLYETHYLENE GLYCOL 3350 17 G PO PACK
17.0000 g | PACK | Freq: Every day | ORAL | Status: DC
  Administered 2018-06-09: 17 g via ORAL
  Filled 2018-06-08: qty 1

## 2018-06-08 MED ORDER — CALCIUM CARBONATE-VITAMIN D 500-200 MG-UNIT PO TABS
1.0000 | ORAL_TABLET | Freq: Every day | ORAL | Status: DC
  Administered 2018-06-09: 1 via ORAL
  Filled 2018-06-08: qty 1

## 2018-06-08 MED ORDER — THYROID 60 MG PO TABS
60.0000 mg | ORAL_TABLET | Freq: Every day | ORAL | Status: DC
  Administered 2018-06-08 – 2018-06-09 (×2): 60 mg via ORAL
  Filled 2018-06-08 (×2): qty 1

## 2018-06-08 MED ORDER — HYDROCODONE-ACETAMINOPHEN 5-325 MG PO TABS: 0.5000 | ORAL_TABLET | Freq: Four times a day (QID) | ORAL | Status: DC | PRN

## 2018-06-08 MED ORDER — ENOXAPARIN SODIUM 40 MG/0.4ML ~~LOC~~ SOLN
40.0000 mg | SUBCUTANEOUS | Status: DC
  Administered 2018-06-08: 40 mg via SUBCUTANEOUS
  Filled 2018-06-08 (×3): qty 0.4

## 2018-06-08 MED ORDER — SODIUM CHLORIDE 0.9 % IV SOLN
2.0000 g | Freq: Once | INTRAVENOUS | Status: AC
  Administered 2018-06-08: 2 g via INTRAVENOUS
  Filled 2018-06-08: qty 2

## 2018-06-08 MED ORDER — SENNOSIDES-DOCUSATE SODIUM 8.6-50 MG PO TABS
1.0000 | ORAL_TABLET | Freq: Every day | ORAL | Status: DC
  Administered 2018-06-08: 1 via ORAL
  Filled 2018-06-08 (×2): qty 1

## 2018-06-08 MED ORDER — ACETAMINOPHEN 650 MG RE SUPP: 650.0000 mg | Freq: Four times a day (QID) | RECTAL | Status: DC | PRN

## 2018-06-08 MED ORDER — VITAMIN D 25 MCG (1000 UNIT) PO TABS
1000.0000 [IU] | ORAL_TABLET | Freq: Every day | ORAL | Status: DC
  Administered 2018-06-08: 1000 [IU] via ORAL
  Filled 2018-06-08 (×2): qty 1

## 2018-06-08 MED ORDER — ALPRAZOLAM 0.25 MG PO TABS
0.2500 mg | ORAL_TABLET | Freq: Two times a day (BID) | ORAL | Status: DC | PRN
  Administered 2018-06-08 (×2): 0.25 mg via ORAL
  Filled 2018-06-08 (×2): qty 1

## 2018-06-08 MED ORDER — VANCOMYCIN HCL IN DEXTROSE 1-5 GM/200ML-% IV SOLN
1000.0000 mg | Freq: Once | INTRAVENOUS | Status: DC
  Filled 2018-06-08: qty 200

## 2018-06-08 MED ORDER — IPRATROPIUM-ALBUTEROL 0.5-2.5 (3) MG/3ML IN SOLN: 3.0000 mL | Freq: Two times a day (BID) | RESPIRATORY_TRACT | Status: DC | PRN

## 2018-06-08 MED ORDER — LIDOCAINE HCL 1 % IJ SOLN
INTRAMUSCULAR | Status: AC
  Filled 2018-06-08: qty 20

## 2018-06-08 MED ORDER — ASPIRIN 81 MG PO CHEW
81.0000 mg | CHEWABLE_TABLET | Freq: Two times a day (BID) | ORAL | Status: DC
  Administered 2018-06-08 – 2018-06-09 (×4): 81 mg via ORAL
  Filled 2018-06-08 (×4): qty 1

## 2018-06-08 NOTE — ED Notes (Signed)
Pt transported to IR 

## 2018-06-08 NOTE — ED Notes (Signed)
Pt transported to CT ?

## 2018-06-08 NOTE — ED Notes (Signed)
Pt's sats 86% on RA, placed on 2LNC 

## 2018-06-08 NOTE — H&P (Addendum)
History and Physical    Alice Hayes LZJ:[id number] DOB: 1922-12-30 DOA: 06/08/2018  PCP: Vernie Shanks, MD  Patient coming from: Skilled nursing facility.  I have personally briefly reviewed patient's old medical records in epic and chart everywhere.  Chief Complaint: Fall and hypoxia.  HPI: Alice Hayes is a 85 y.o. female with medical history significant of dementia, history of melanoma, chronic pericardial effusion as well as left-sided pleural effusion, chronic diastolic heart failure, UTI currently on treatment who was discharged from [facility] 06/07/2018 after treatment for UTI and pericardial effusion.  She was discharged to a skilled nursing facility.  She attempted walking today without supervision, fell on the floor with some injuries.  Patient was found to be 86% on room air on examination so was transferred to ER.  Patient was seen in the emergency room.  Her only complaint is shortness of breath on ambulating.  Patient is poor historian.  She denies any other complaints other than not liking to have injections and IV lines to be open.  I met patients daughter at the bedside.  I reviewed patient's hospitalization from [facility] and discharge summary.  Staff at the facility believed that patient was trying to go to the bathroom without her walker shoe when she tripped and fell.  Patient denied any pain.  No back pain no neck pain.  On arrival, patient was noted to be saturating 88% on room air, 2 L nasal cannula was applied. Other than a recent hospitalization with encephalopathy and UTI, no other new symptoms noted. Her pericardial effusion was significant, however patient and family decided not to go for pericardiocentesis after consultation with cardiology.  They decided conservative management.   ED Course: In the emergency room, suspected to have hospital-acquired pneumonia, was given vancomycin and cefepime after blood cultures were drawn.  Admission  requested because of hypoxia. A skeletal survey including CT scan of the head and cervical spine is negative.  Chest x-ray shows left lower lobe opacification, consolidation versus effusion.  Review of Systems: As per HPI otherwise 10 point review of systems negative.  Limited due to dementia.  Supplemented by chart records and family at the bedside.  Past Medical History:  Diagnosis Date  . CAD (coronary artery disease)    nonobstructive cath 2006  . Cholelithiases   . COPD (chronic obstructive pulmonary disease) ([place])   . Dementia ([place])   . Diastolic heart failure ([place])   . HTN (hypertension)   . Hyperlipidemia   . Melanoma ([place])   . Osteoarthritis   . Osteoporosis   . Pubic ramus fracture ([place]) 12/01/2017    Past Surgical History:  Procedure Laterality Date  . APPENDECTOMY    . BLADDER SUSPENSION     2006  . BREAST LUMPECTOMY    . CARPAL TUNNEL RELEASE     2007  . CATARACT EXTRACTION    . CHOLECYSTECTOMY    . LYMPH NODE BIOPSY    . MELANOMA EXCISION     2008  . PAROTIDECTOMY     1959  . TONSILLECTOMY AND ADENOIDECTOMY       reports that she has never smoked. She has never used smokeless tobacco. She reports that she does not drink alcohol or use drugs.  Allergies  Allergen Reactions  . Voltaren Ophthalmic [Diclofenac] Other (See Comments)    Reaction not recalled by family ??    Family History  Problem Relation Age of Onset  . CAD Father  Sudden death age 33  . CAD Son 26       MI.  Alive in his 13s  . Cancer Daughter        Breast  . Pancreatitis Daughter        Severe episode    Prior to Admission medications   Medication Sig Start Date End Date Taking? Authorizing Provider  ALPRAZolam (XANAX) 0.25 MG tablet Take 0.25 mg by mouth 2 (two) times daily as needed for anxiety.   Yes [provider]  aspirin 81 MG chewable tablet Chew 81 mg by mouth 2 (two) times daily.   Yes [provider]  bisacodyl (DULCOLAX) 5 MG EC tablet  Take 1 tablet (5 mg total) by mouth daily as needed for moderate constipation. 12/03/17  Yes Regalado, Belkys A, MD  Calcium Carb-Cholecalciferol (CALCIUM 600+D3) 600-200 MG-UNIT TABS Take 1 tablet by mouth daily.   Yes [provider]  Cholecalciferol (VITAMIN D-3) 1000 units CAPS Take 1,000 Units by mouth daily.   Yes [provider]  citalopram (CELEXA) 20 MG tablet Take 20 mg by mouth daily.   Yes [provider]  Coenzyme Q10 (CO Q 10) 10 MG CAPS Take 10 mg by mouth daily.   Yes [provider]  HYDROcodone-acetaminophen (NORCO) 5-325 MG tablet Take 0.5 tablets by mouth every 6 (six) hours as needed for moderate pain or severe pain. 12/03/17  Yes Regalado, Belkys A, MD  ipratropium-albuterol (DUONEB) 0.5-2.5 (3) MG/3ML SOLN Take 3 mLs by nebulization 2 (two) times daily. Patient taking differently: Take 3 mLs by nebulization 2 (two) times daily as needed (SOB).  12/03/17  Yes Regalado, Belkys A, MD  OVER THE COUNTER MEDICATION Take 1 tablet by mouth daily. "ICAPS Multivitamin + Lutein tablets: Take 1 tablet by mouth once a day    Yes [provider]  polyethylene glycol (MIRALAX / GLYCOLAX) packet Take 17 g by mouth daily. 12/03/17  Yes Regalado, Belkys A, MD  senna-docusate (SENOKOT-S) 8.6-50 MG tablet Take 1 tablet by mouth at bedtime. 12/03/17  Yes Regalado, Belkys A, MD  thyroid (NP THYROID) 60 MG tablet Take 60 mg by mouth daily.   Yes [provider]  amoxicillin (AMOXIL) 875 MG tablet Take 1 tablet (875 mg total) by mouth 2 (two) times daily for 3 days. Patient not taking: Reported on 06/08/2018 06/07/18 06/10/18  Cristy Folks, MD    Physical Exam: Vitals:   06/08/18 9390 06/08/18 0641 06/08/18 0642 06/08/18 0642  BP:      Pulse: 70 74 74   Resp: 19 17 (!) 23   Temp:    97.7 F (36.5 C)  TempSrc:    Oral  SpO2: 93% 94% 95%   Weight:        Constitutional: NAD, calm, comfortable Vitals:   06/08/18 0640 06/08/18 0641 06/08/18 0642  06/08/18 0642  BP:      Pulse: 70 74 74   Resp: 19 17 (!) 23   Temp:    97.7 F (36.5 C)  TempSrc:    Oral  SpO2: 93% 94% 95%   Weight:       Eyes: PERRL, lids and conjunctivae normal, a contusion at left forehead. ENMT: Mucous membranes are moist. Posterior pharynx clear of any exudate or lesions.Normal dentition.  Neck: normal, supple, no masses, no thyromegaly Respiratory: clear to auscultation bilaterally, no wheezing, no crackles. Normal respiratory effort. No accessory muscle use.  Patient is on 2 L of oxygen and saturating 95%. Decreased  air entry on the left posterior base. Cardiovascular: Regular rate and rhythm, no murmurs / rubs / gallops. No extremity edema. 2+ pedal pulses. No carotid bruits.  Abdomen: no tenderness, no masses palpated. No hepatosplenomegaly. Bowel sounds positive.  Musculoskeletal: no clubbing / cyanosis. No joint deformity upper and lower extremities. Good ROM, no contractures. Normal muscle tone.  Skin: no rashes, lesions, ulcers. No induration Neurologic: CN 2-12 grossly intact. Sensation intact, DTR normal. Strength 5/5 in all 4.  Psychiatric: Normal judgment and insight. Alert and oriented x 2-3. Normal mood.  Contusion right side of forehead. Skin laceration right hand small finger.   Labs on Admission: I have personally reviewed following labs and imaging studies  CBC: Recent Labs  Lab 06/02/18 1720 06/03/18 0602 06/04/18 0414 06/05/18 0502 06/08/18 0644  WBC 18.1* 10.5 7.5 10.1 7.3  NEUTROABS  --   --  5.9 7.5 5.4  HGB 15.2* 12.4 11.6* 12.5 13.9  HCT 47.1* 39.2 38.1 39.9 44.6  MCV 96.9 97.8 99.5 99.3 94.9  PLT 265 216 201 207 655   Basic Metabolic Panel: Recent Labs  Lab 06/03/18 0602 06/04/18 0414 06/05/18 0502 06/06/18 0440 06/08/18 0644  NA 138 148* 145 144 142  K 3.4* 3.6 3.0* 4.1 3.1*  CL 108 113* 107 104 103  CO2 22 28 30 30 30   GLUCOSE 101* 95 128* 106* 134*  BUN 14 9 9  7* 8  CREATININE 0.79 0.69 0.66 0.59 0.64    CALCIUM 7.5* 7.7* 7.9* 8.1* 8.6*  MG 2.0 2.2  --  2.3  --   PHOS 3.0  --   --   --   --    GFR: Estimated Creatinine Clearance: 33.1 mL/min (by C-G formula based on SCr of 0.64 mg/dL). Liver Function Tests: Recent Labs  Lab 06/02/18 1720 06/03/18 0602  AST 38 18  ALT 16 13  ALKPHOS 49 39  BILITOT 1.6* 1.0  PROT 6.5 5.7*  ALBUMIN 3.9 3.1*   Recent Labs  Lab 06/02/18 1720  LIPASE 33   No results for input(s): AMMONIA in the last 168 hours. Coagulation Profile: No results for input(s): INR, PROTIME in the last 168 hours. Cardiac Enzymes: Recent Labs  Lab 06/03/18 0020 06/03/18 0602 06/03/18 1222  TROPONINI 0.03* 0.03* 0.05*   BNP (last 3 results) No results for input(s): PROBNP in the last 8760 hours. HbA1C: No results for input(s): HGBA1C in the last 72 hours. CBG: No results for input(s): GLUCAP in the last 168 hours. Lipid Profile: No results for input(s): CHOL, HDL, LDLCALC, TRIG, CHOLHDL, LDLDIRECT in the last 72 hours. Thyroid Function Tests: No results for input(s): TSH, T4TOTAL, FREET4, T3FREE, THYROIDAB in the last 72 hours. Anemia Panel: No results for input(s): VITAMINB12, FOLATE, FERRITIN, TIBC, IRON, RETICCTPCT in the last 72 hours. Urine analysis:    Component Value Date/Time   COLORURINE YELLOW 06/02/2018 1942   APPEARANCEUR HAZY (A) 06/02/2018 1942   LABSPEC 1.014 06/02/2018 1942   PHURINE 6.0 06/02/2018 1942   GLUCOSEU NEGATIVE 06/02/2018 1942   HGBUR SMALL (A) 06/02/2018 1942   BILIRUBINUR NEGATIVE 06/02/2018 1942   KETONESUR NEGATIVE 06/02/2018 1942   PROTEINUR 30 (A) 06/02/2018 1942   UROBILINOGEN 0.2 04/06/2015 1722   NITRITE POSITIVE (A) 06/02/2018 1942   LEUKOCYTESUR LARGE (A) 06/02/2018 1942    Radiological Exams on Admission: Dg Chest 2 View  Result Date: 06/08/2018 CLINICAL DATA:  Shortness of breath with fall EXAM: CHEST - 2 VIEW COMPARISON:  June 02, 2018 FINDINGS: There is  persistent opacity in the left lower lobe with  small left pleural effusion. Lungs elsewhere are clear. Heart is enlarged with pulmonary vascularity normal. No adenopathy. There is aortic atherosclerosis. There is a sclerotic focus in the proximal left humerus, stable. No pneumothorax. IMPRESSION: Opacity concerning for pneumonia left lower lobe with left pleural effusion. Lungs elsewhere clear. Stable cardiomegaly. There is aortic atherosclerosis. No evident pneumothorax. Sclerotic focus consistent with either enchondroma or bone infarct proximal left humerus. Aortic Atherosclerosis (ICD10-I70.0). Electronically Signed   By: Lowella Grip III M.D.   On: 06/08/2018 07:24   Ct Head Wo Contrast  Result Date: 06/08/2018 CLINICAL DATA:  Unwitnessed fall EXAM: CT HEAD WITHOUT CONTRAST CT CERVICAL SPINE WITHOUT CONTRAST TECHNIQUE: Multidetector CT imaging of the head and cervical spine was performed following the standard protocol without intravenous contrast. Multiplanar CT image reconstructions of the cervical spine were also generated. COMPARISON:  11/01/2017 FINDINGS: CT HEAD FINDINGS Brain: There is atrophy and chronic small vessel disease changes. No acute intracranial abnormality. Specifically, no hemorrhage, hydrocephalus, mass lesion, acute infarction, or significant intracranial injury. Vascular: No hyperdense vessel or unexpected calcification. Skull: No acute calvarial abnormality. Sinuses/Orbits: Visualized paranasal sinuses and mastoids clear. Orbital soft tissues unremarkable. Other: None CT CERVICAL SPINE FINDINGS Alignment: Slight anterolisthesis of C6 on C7 and C7 on T1 felt to be related to facet disease. Skull base and vertebrae: No acute fracture. No primary bone lesion or focal pathologic process. Soft tissues and spinal canal: No prevertebral fluid or swelling. No visible canal hematoma. Disc levels: Diffuse degenerative disc disease, most pronounced at C5-6. Diffuse bilateral degenerative facet disease. Upper chest: No acute findings  Other: None IMPRESSION: Atrophy, chronic microvascular disease. No acute intracranial abnormality. No acute bony abnormality in the cervical spine. Degenerative disc and facet disease. Electronically Signed   By: Rolm Baptise M.D.   On: 06/08/2018 08:35   Ct Cervical Spine Wo Contrast  Result Date: 06/08/2018 CLINICAL DATA:  Unwitnessed fall EXAM: CT HEAD WITHOUT CONTRAST CT CERVICAL SPINE WITHOUT CONTRAST TECHNIQUE: Multidetector CT imaging of the head and cervical spine was performed following the standard protocol without intravenous contrast. Multiplanar CT image reconstructions of the cervical spine were also generated. COMPARISON:  11/01/2017 FINDINGS: CT HEAD FINDINGS Brain: There is atrophy and chronic small vessel disease changes. No acute intracranial abnormality. Specifically, no hemorrhage, hydrocephalus, mass lesion, acute infarction, or significant intracranial injury. Vascular: No hyperdense vessel or unexpected calcification. Skull: No acute calvarial abnormality. Sinuses/Orbits: Visualized paranasal sinuses and mastoids clear. Orbital soft tissues unremarkable. Other: None CT CERVICAL SPINE FINDINGS Alignment: Slight anterolisthesis of C6 on C7 and C7 on T1 felt to be related to facet disease. Skull base and vertebrae: No acute fracture. No primary bone lesion or focal pathologic process. Soft tissues and spinal canal: No prevertebral fluid or swelling. No visible canal hematoma. Disc levels: Diffuse degenerative disc disease, most pronounced at C5-6. Diffuse bilateral degenerative facet disease. Upper chest: No acute findings Other: None IMPRESSION: Atrophy, chronic microvascular disease. No acute intracranial abnormality. No acute bony abnormality in the cervical spine. Degenerative disc and facet disease. Electronically Signed   By: Rolm Baptise M.D.   On: 06/08/2018 08:35    EKG: Independently reviewed.  Twelve-lead EKG shows normal sinus rhythm, no acute ST-T wave changes.  Her EKG is  comparable to previous EKG.  She has chronic right bundle branch block.  Assessment/Plan Principal Problem:   Healthcare-associated pneumonia Active Problems:   Hypothyroidism   Dementia (HCC)   Hyperlipidemia  Hypertension   Acute UTI   Pericardial effusion   Chronic respiratory failure with hypoxia (HCC)   Pleural effusion   Hypoxia  Healthcare associated pneumonia: Suspected on admission.  Patient has other explanation for hypoxia and no evidence of infection at this time.  She was given a dose of vancomycin and cefepime in the emergency room.  Blood cultures were drawn.  Given no other evidence of infection, will hold off on further antibiotics.  Recheck CBC in the morning.  Will order thoracentesis per diagnosis and therapeutic purpose.  If pleural fluid is transudate, she will not need treatment with broad-spectrum antibiotics.  Patient is currently on amoxicillin for UTI and should finish 7 days of therapy.  Acute UTI: Not present on admission.  Pansensitive E. coli treated with Rocephin and currently on amoxicillin.  She will finish 7 days of therapy.  Hypoxia and shortness of breath: Mild hypoxia.  Suspect chronic hypoxia.  Suspect multifactorial. pericardial effusion which is probably benign due to chronic diastolic heart failure, she had echocardiogram done last week that showed some RV dysfunction with compression.  Patient and family decided no pericardiocentesis or invasive procedure.  Patient is fairly stable at this time.  No evidence of cardiac tamponade. chronic left sided pleural effusion, slight increase in size at this time.  This is probably a chronic effusion related to her debility, hypoalbuminemia.  There is questionable consolidation, however without evidence of infection. I discussed with patient's family , diagnostic thoracentesis will be best option to avoid unnecessary intervention as well as broad-spectrum antibiotics.  Will have repeat chest x-ray done after  thoracentesis.  Patient has history of melanoma.  Even if this proves to be malignancy, family is not interested in further invasive testing. Patient should be ambulated and evaluated for supplemental oxygen before discharge.  If all the tests prove negative, she may just need supplemental oxygen for symptomatic treatment.  Hypothyroidism: Currently euthyroid on supplement.  Continue.  Dementia with occasional sundowning: Patient does benefit with as needed benzodiazepines.  She will continue as needed Xanax.  Delirium prevention.  Frequent reorientation.   DVT prophylaxis: Lovenox.   Code Status: DNR/DNI.   Family Communication: Discussed with patient's daughter at the bedside, Ms. Tomi Bamberger who is her healthcare power of attorney.   Disposition Plan: Back to facility.  Consults called: None.   Admission status: Observation.  Patient is from assisted living facility.  She is DNR/DNI.  Family wishes conservative management with no invasive testings.  She was recently in the hospital.  Currently fairly stable.  Agree with observation. thoracentesis as consented by family.  Patient probably can go back to the facility tomorrow if negative findings and thoracentesis.  She may need supplemental oxygen.   Barb Merino MD [facility] Pager [phone]  If 7PM-7AM, please contact night-coverage [email or web address] Password [email or web address]  06/08/2018, 9:35 AM

## 2018-06-08 NOTE — ED Provider Notes (Signed)
[facility] EMERGENCY DEPARTMENT Provider Note   CSN: [id number] Arrival date & time:        History   Chief Complaint Chief Complaint  Patient presents with  . Fall  . Head Injury    HPI Alice Hayes is a 85 y.o. female.  Alice Hayes is a 85 y.o. female with a history of CAD, diastolic heart failure, hypertension, hyperlipidemia, COPD, dementia, osteoporosis and melanoma, who presents to the emergency department from [facility] facility via EMS after an unwitnessed fall.  Staff believed that patient was trying to go to the bathroom without her walker when she tripped and fell.  Hematoma noted to the right forehead, without laceration.  C-collar placed by EMS.  Patient denies any focal pain anywhere from fall.  No neck or back pain.  Alert and oriented x2 on arrival.  Was discharged from the hospital yesterday to skilled nursing facility after recent admission for UTI with encephalopathy, currently on antibiotics.  During recent admission patient was also found to have a large pericardial effusion on echocardiogram, cardiology was consulted, long discussion with patient's family who did not want aggressive measures taken for this, no pericardiocentesis performed, per daughter chronic and not new.  On arrival patient noted to be satting at 88% on room air, 2 L nasal cannula applied, patient in no acute distress.  Small skin tear noted to the right hand as well.  Patient's daughter arrived and able to provide additional history, patient has never required oxygen at baseline, when she was discharged from the hospital yesterday she was noted to have some intermittent hypoxia with oxygen sats as low was 84% but was not discharged with any oxygen.  Level 5 caveat: Dementia     Past Medical History:  Diagnosis Date  . CAD (coronary artery disease)    nonobstructive cath 2006  . Cholelithiases   . COPD (chronic obstructive pulmonary disease)  ([place])   . Dementia ([place])   . Diastolic heart failure ([place])   . HTN (hypertension)   . Hyperlipidemia   . Melanoma ([place])   . Osteoarthritis   . Osteoporosis   . Pubic ramus fracture ([place]) 12/01/2017    Patient Active Problem List   Diagnosis Date Noted  . Chronic respiratory failure with hypoxia ([place]) 06/08/2018  . Pleural effusion 06/08/2018  . Healthcare-associated pneumonia 06/08/2018  . Hypoxia 06/08/2018  . Hypernatremia   . Acute UTI   . Pericardial effusion   . Sepsis ([place]) 06/02/2018  . Pyelonephritis 06/02/2018  . Dehydration 06/02/2018  . Intractable nausea and vomiting 06/02/2018  . Hypothyroidism 12/01/2017  . Dementia ([place]) 12/01/2017  . Hyperlipidemia 12/01/2017  . Fracture of pubic ramus ([place]) 12/01/2017  . Hypertension 12/01/2017    Past Surgical History:  Procedure Laterality Date  . APPENDECTOMY    . BLADDER SUSPENSION     2006  . BREAST LUMPECTOMY    . CARPAL TUNNEL RELEASE     2007  . CATARACT EXTRACTION    . CHOLECYSTECTOMY    . LYMPH NODE BIOPSY    . MELANOMA EXCISION     2008  . PAROTIDECTOMY     1959  . TONSILLECTOMY AND ADENOIDECTOMY       OB History    Gravida      Para      Term      Preterm      AB      Living  4     SAB  TAB      Ectopic      Multiple      Live Births               Home Medications    Prior to Admission medications   Medication Sig Start Date End Date Taking? Authorizing Provider  ALPRAZolam (XANAX) 0.25 MG tablet Take 0.25 mg by mouth 2 (two) times daily as needed for anxiety.   Yes [provider]  aspirin 81 MG chewable tablet Chew 81 mg by mouth 2 (two) times daily.   Yes [provider]  bisacodyl (DULCOLAX) 5 MG EC tablet Take 1 tablet (5 mg total) by mouth daily as needed for moderate constipation. 12/03/17  Yes Regalado, Belkys A, MD  Calcium Carb-Cholecalciferol (CALCIUM 600+D3) 600-200 MG-UNIT TABS Take 1 tablet by mouth daily.   Yes [provider]   Cholecalciferol (VITAMIN D-3) 1000 units CAPS Take 1,000 Units by mouth daily.   Yes [provider]  citalopram (CELEXA) 20 MG tablet Take 20 mg by mouth daily.   Yes [provider]  Coenzyme Q10 (CO Q 10) 10 MG CAPS Take 10 mg by mouth daily.   Yes [provider]  HYDROcodone-acetaminophen (NORCO) 5-325 MG tablet Take 0.5 tablets by mouth every 6 (six) hours as needed for moderate pain or severe pain. 12/03/17  Yes Regalado, Belkys A, MD  ipratropium-albuterol (DUONEB) 0.5-2.5 (3) MG/3ML SOLN Take 3 mLs by nebulization 2 (two) times daily. Patient taking differently: Take 3 mLs by nebulization 2 (two) times daily as needed (SOB).  12/03/17  Yes Regalado, Belkys A, MD  OVER THE COUNTER MEDICATION Take 1 tablet by mouth daily. "ICAPS Multivitamin + Lutein tablets: Take 1 tablet by mouth once a day    Yes [provider]  polyethylene glycol (MIRALAX / GLYCOLAX) packet Take 17 g by mouth daily. 12/03/17  Yes Regalado, Belkys A, MD  senna-docusate (SENOKOT-S) 8.6-50 MG tablet Take 1 tablet by mouth at bedtime. 12/03/17  Yes Regalado, Belkys A, MD  thyroid (NP THYROID) 60 MG tablet Take 60 mg by mouth daily.   Yes [provider]  amoxicillin (AMOXIL) 875 MG tablet Take 1 tablet (875 mg total) by mouth 2 (two) times daily for 3 days. Patient not taking: Reported on 06/08/2018 06/07/18 06/10/18  Cristy Folks, MD    Family History Family History  Problem Relation Age of Onset  . CAD Father        Sudden death age 78  . CAD Son 29       MI.  Alive in his 11s  . Cancer Daughter        Breast  . Pancreatitis Daughter        Severe episode    Social History Social History   Tobacco Use  . Smoking status: Never Smoker  . Smokeless tobacco: Never Used  Substance Use Topics  . Alcohol use: Never    Frequency: Never  . Drug use: Never     Allergies   Voltaren ophthalmic [diclofenac]   Review of Systems Review of Systems  Unable to perform ROS:  Dementia     Physical Exam Updated Vital Signs BP (!) 177/78   Pulse 74   Temp 97.7 F (36.5 C) (Oral)   Resp (!) 23   Wt 54.4 kg   SpO2 95%   BMI 24.22 kg/m   Physical Exam Vitals signs and nursing note reviewed.  Constitutional:      General: She is  not in acute distress.    Appearance: Normal appearance. She is well-developed. She is not ill-appearing or diaphoretic.  HENT:     Head: Normocephalic.     Comments: Hematoma over the Right forehead without associated laceration, no palpable bony deformity or step-off, negative battle sign, no CSF otorrhea or rhinorrhea    Right Ear: Tympanic membrane normal.     Left Ear: Tympanic membrane normal.     Mouth/Throat:     Mouth: Mucous membranes are moist.     Pharynx: Oropharynx is clear.  Eyes:     General:        Right eye: No discharge.        Left eye: No discharge.     Extraocular Movements: Extraocular movements intact.     Pupils: Pupils are equal, round, and reactive to light.  Neck:     Comments: C-collar in place Cardiovascular:     Rate and Rhythm: Normal rate and regular rhythm.     Pulses: Normal pulses.     Heart sounds: Normal heart sounds. No murmur. No friction rub. No gallop.   Pulmonary:     Effort: Pulmonary effort is normal. No respiratory distress.     Breath sounds: Normal breath sounds. No wheezing or rales.     Comments: Respirations equal and unlabored, patient able to speak in full sentences, lungs clear to auscultation bilaterally with some diminished lung sounds in the left lower lobe Abdominal:     General: Bowel sounds are normal. There is no distension.     Palpations: Abdomen is soft. There is no mass.     Tenderness: There is no abdominal tenderness. There is no guarding.     Comments: Abdomen soft, nondistended, nontender to palpation in all quadrants without guarding or peritoneal signs  Musculoskeletal:        General: No deformity.     Right lower leg: No edema.     Left lower  leg: No edema.     Comments: Small skin tear to the base of the right pinky finger over the dorsum, bleeding controlled, this is very superficial, no surrounding edema or palpable bony deformity. All joints supple and easily movable.  No tenderness to palpation or laxity over the pelvis, no shortening or rotation of the lower extremities. Thoracic and low back nontender to palpation.  Skin:    General: Skin is warm and dry.     Capillary Refill: Capillary refill takes less than 2 seconds.  Neurological:     Mental Status: She is alert. Mental status is at baseline.     Coordination: Coordination normal.     Comments: Speech is clear, able to follow commands CN III-XII intact Normal strength in upper and lower extremities bilaterally including dorsiflexion and plantar flexion, strong and equal grip strength Sensation normal to light and sharp touch Moves extremities without ataxia, coordination intact  Psychiatric:        Mood and Affect: Mood normal.        Behavior: Behavior normal.      ED Treatments / Results  Labs (all labs ordered are listed, but only abnormal results are displayed) Labs Reviewed  CBC WITH DIFFERENTIAL/PLATELET - Abnormal; Notable for the following components:      Result Value   RDW 16.4 (*)    All other components within normal limits  BASIC METABOLIC PANEL - Abnormal; Notable for the following components:   Potassium 3.1 (*)    Glucose, Bld 134 (*)  Calcium 8.6 (*)    All other components within normal limits  CULTURE, BLOOD (ROUTINE X 2)  CULTURE, BLOOD (ROUTINE X 2)  I-STAT CG4 LACTIC ACID, ED  I-STAT CG4 LACTIC ACID, ED    EKG None  Radiology Dg Chest 2 View  Result Date: 06/08/2018 CLINICAL DATA:  Shortness of breath with fall EXAM: CHEST - 2 VIEW COMPARISON:  June 02, 2018 FINDINGS: There is persistent opacity in the left lower lobe with small left pleural effusion. Lungs elsewhere are clear. Heart is enlarged with pulmonary vascularity  normal. No adenopathy. There is aortic atherosclerosis. There is a sclerotic focus in the proximal left humerus, stable. No pneumothorax. IMPRESSION: Opacity concerning for pneumonia left lower lobe with left pleural effusion. Lungs elsewhere clear. Stable cardiomegaly. There is aortic atherosclerosis. No evident pneumothorax. Sclerotic focus consistent with either enchondroma or bone infarct proximal left humerus. Aortic Atherosclerosis (ICD10-I70.0). Electronically Signed   By: Lowella Grip III M.D.   On: 06/08/2018 07:24   Ct Head Wo Contrast  Result Date: 06/08/2018 CLINICAL DATA:  Unwitnessed fall EXAM: CT HEAD WITHOUT CONTRAST CT CERVICAL SPINE WITHOUT CONTRAST TECHNIQUE: Multidetector CT imaging of the head and cervical spine was performed following the standard protocol without intravenous contrast. Multiplanar CT image reconstructions of the cervical spine were also generated. COMPARISON:  11/01/2017 FINDINGS: CT HEAD FINDINGS Brain: There is atrophy and chronic small vessel disease changes. No acute intracranial abnormality. Specifically, no hemorrhage, hydrocephalus, mass lesion, acute infarction, or significant intracranial injury. Vascular: No hyperdense vessel or unexpected calcification. Skull: No acute calvarial abnormality. Sinuses/Orbits: Visualized paranasal sinuses and mastoids clear. Orbital soft tissues unremarkable. Other: None CT CERVICAL SPINE FINDINGS Alignment: Slight anterolisthesis of C6 on C7 and C7 on T1 felt to be related to facet disease. Skull base and vertebrae: No acute fracture. No primary bone lesion or focal pathologic process. Soft tissues and spinal canal: No prevertebral fluid or swelling. No visible canal hematoma. Disc levels: Diffuse degenerative disc disease, most pronounced at C5-6. Diffuse bilateral degenerative facet disease. Upper chest: No acute findings Other: None IMPRESSION: Atrophy, chronic microvascular disease. No acute intracranial abnormality. No  acute bony abnormality in the cervical spine. Degenerative disc and facet disease. Electronically Signed   By: Rolm Baptise M.D.   On: 06/08/2018 08:35   Ct Cervical Spine Wo Contrast  Result Date: 06/08/2018 CLINICAL DATA:  Unwitnessed fall EXAM: CT HEAD WITHOUT CONTRAST CT CERVICAL SPINE WITHOUT CONTRAST TECHNIQUE: Multidetector CT imaging of the head and cervical spine was performed following the standard protocol without intravenous contrast. Multiplanar CT image reconstructions of the cervical spine were also generated. COMPARISON:  11/01/2017 FINDINGS: CT HEAD FINDINGS Brain: There is atrophy and chronic small vessel disease changes. No acute intracranial abnormality. Specifically, no hemorrhage, hydrocephalus, mass lesion, acute infarction, or significant intracranial injury. Vascular: No hyperdense vessel or unexpected calcification. Skull: No acute calvarial abnormality. Sinuses/Orbits: Visualized paranasal sinuses and mastoids clear. Orbital soft tissues unremarkable. Other: None CT CERVICAL SPINE FINDINGS Alignment: Slight anterolisthesis of C6 on C7 and C7 on T1 felt to be related to facet disease. Skull base and vertebrae: No acute fracture. No primary bone lesion or focal pathologic process. Soft tissues and spinal canal: No prevertebral fluid or swelling. No visible canal hematoma. Disc levels: Diffuse degenerative disc disease, most pronounced at C5-6. Diffuse bilateral degenerative facet disease. Upper chest: No acute findings Other: None IMPRESSION: Atrophy, chronic microvascular disease. No acute intracranial abnormality. No acute bony abnormality in the cervical spine. Degenerative disc and facet  disease. Electronically Signed   By: Rolm Baptise M.D.   On: 06/08/2018 08:35    Procedures Procedures (including critical care time)  Medications Ordered in ED Medications  aspirin chewable tablet 81 mg (81 mg Oral Given 06/08/18 0958)  HYDROcodone-acetaminophen (NORCO/VICODIN) 5-325 MG per  tablet 0.5 tablet (has no administration in time range)  ALPRAZolam Duanne Moron) tablet 0.25 mg (0.25 mg Oral Given 06/08/18 0958)  citalopram (CELEXA) tablet 20 mg (20 mg Oral Given 06/08/18 0957)  thyroid (ARMOUR) tablet 60 mg (has no administration in time range)  bisacodyl (DULCOLAX) EC tablet 5 mg (has no administration in time range)  polyethylene glycol (MIRALAX / GLYCOLAX) packet 17 g (has no administration in time range)  senna-docusate (Senokot-S) tablet 1 tablet (has no administration in time range)  ipratropium-albuterol (DUONEB) 0.5-2.5 (3) MG/3ML nebulizer solution 3 mL (has no administration in time range)  enoxaparin (LOVENOX) injection 40 mg (has no administration in time range)  acetaminophen (TYLENOL) tablet 650 mg (has no administration in time range)    Or  acetaminophen (TYLENOL) suppository 650 mg (has no administration in time range)  0.9 %  sodium chloride infusion (500 mLs Intravenous New Bag/Given 06/08/18 0937)  cholecalciferol (VITAMIN D3) tablet 1,000 Units (has no administration in time range)  calcium-vitamin D (OSCAL WITH D) 500-200 MG-UNIT per tablet 1 tablet (has no administration in time range)  amoxicillin (AMOXIL) capsule 500 mg (has no administration in time range)  lidocaine (XYLOCAINE) 1 % (with pres) injection (has no administration in time range)  ceFEPIme (MAXIPIME) 2 g in sodium chloride 0.9 % 100 mL IVPB (0 g Intravenous Stopped 06/08/18 1029)     Initial Impression / Assessment and Plan / ED Course  I have reviewed the triage vital signs and the nursing notes.  Pertinent labs & imaging results that were available during my care of the patient were reviewed by me and considered in my medical decision making (see chart for details).  Patient presents for evaluation of unwitnessed fall at skilled nursing facility.  Right forehead hematoma and skin tear to the right fifth finger, c-collar placed by EMS but no other obvious trauma.  On arrival patient noted to  be hypoxic to 86% on room air, no previous oxygen requirement, placed on 2 L nasal cannula with improvement.  Afebrile and vitals otherwise unremarkable.  On exam patient with some diminished breath sounds in the left lower lobe.  No abdominal tenderness.  Mental status at baseline and no focal neurologic deficits.  Small skin tear over the right fifth finger repaired with Steri-Strips.  Will get CT head and neck, chest x-ray, EKG and basic labs.  Patient noted to have large pericardial effusion during recent admission, cardiology was consulted but family has expressed desire to not perform any aggressive measures for this.  Patient recently admitted and treated with antibiotics, and is currently on amoxicillin for UTI.  CT of the head and neck show no acute intracranial abnormality, no traumatic fracture or malalignment of the cervical spine.  Chest x-ray with new opacity in the left lower lobe concerning for pneumonia with left pleural effusion.  Given patient's recent hospitalization, and that she is already on antibiotics with new lung infiltrate, concerning for possible hospital-acquired pneumonia, especially in the setting of new hypoxia today, although patient does also have left pleural effusion currently which is chronic but worsened compared to prior chest x-ray.  Will start patient on cefepime and vancomycin, lactic acid and blood cultures collected.  Hospitalist consulted for  admission.  Case discussed with Dr. Sloan Leiter who will see and admit the patient for further treatment and evaluation.   Final Clinical Impressions(s) / ED Diagnoses   Final diagnoses:  Injury of head, initial encounter  Fall, initial encounter  Pneumonia of left lower lobe due to infectious organism [facility])  Pleural effusion, left  Pericardial effusion  Skin tear of right hand without complication, initial encounter    ED Discharge Orders    None       Janet Berlin 06/08/18 1229    Mesner, Corene Cornea,  MD 06/09/18 [phone]

## 2018-06-08 NOTE — ED Triage Notes (Addendum)
Pt in from [facility] via [facility] after unwitnessed fall. Staff believe she was trying to go to bathroom without using her walker and then tripped and fell. No thinners, does have R frontal hematoma present and c-collar on. Denies any neck/back pain. Sats 88% on RA, 2LNC applied. Has dementia, is a&ox2 on arrival. Being trx for UTI currently per EMS

## 2018-06-08 NOTE — ED Notes (Signed)
Attempted to give report, receiving RN will return phone call

## 2018-06-08 NOTE — ED Provider Notes (Signed)
Medical screening examination/treatment/procedure(s) were conducted as a shared visit with non-physician practitioner(s) and myself.  I personally evaluated the patient during the encounter.  EKG Interpretation  Date/Time:  Wednesday June 08 2018 08:38:00 EST Ventricular Rate:  72 PR Interval:    QRS Duration: 122 QT Interval:  445 QTC Calculation: 445 R Axis:   -65 Text Interpretation:  Sinus rhythm Multiple premature complexes, vent & supraven RBBB and LAFB Probable LVH with secondary repol abnrm Since last tracing of earlier today No significant change was found Confirmed by Francine Graven [phone]) on 06/09/2018 6:05:24 PM   Patient is a 85 year old female with dementia who presents from [facility] via EMS after an unwitnessed fall.  Has right forehead hematoma.  C-collar in place.  Sats 88% on RA.  Being treated for UTI currently.  During last admission found to have pericardial effusion.  Family declined drainage.  CT scans of the head and cervical spine show no acute injury.  X-ray concerning for possible healthcare associated pneumonia.  Given new oxygen requirement, will give broad-spectrum antibiotics and admit.   Ward, Delice Bison, DO 06/12/18 [phone]

## 2018-06-09 DIAGNOSIS — J9611 Chronic respiratory failure with hypoxia: Secondary | ICD-10-CM

## 2018-06-09 DIAGNOSIS — J189 Pneumonia, unspecified organism: Secondary | ICD-10-CM | POA: Diagnosis not present

## 2018-06-09 DIAGNOSIS — M2550 Pain in unspecified joint: Secondary | ICD-10-CM | POA: Diagnosis not present

## 2018-06-09 DIAGNOSIS — F0151 Vascular dementia with behavioral disturbance: Secondary | ICD-10-CM

## 2018-06-09 DIAGNOSIS — E7849 Other hyperlipidemia: Secondary | ICD-10-CM | POA: Diagnosis not present

## 2018-06-09 DIAGNOSIS — R5381 Other malaise: Secondary | ICD-10-CM | POA: Diagnosis not present

## 2018-06-09 DIAGNOSIS — Z7401 Bed confinement status: Secondary | ICD-10-CM | POA: Diagnosis not present

## 2018-06-09 DIAGNOSIS — N390 Urinary tract infection, site not specified: Secondary | ICD-10-CM

## 2018-06-09 DIAGNOSIS — I313 Pericardial effusion (noninflammatory): Secondary | ICD-10-CM | POA: Diagnosis not present

## 2018-06-09 LAB — CBC
HCT: 42.5 % (ref 36.0–46.0)
Hemoglobin: 13.8 g/dL (ref 12.0–15.0)
MCH: 30.3 pg (ref 26.0–34.0)
MCHC: 32.5 g/dL (ref 30.0–36.0)
MCV: 93.4 fL (ref 80.0–100.0)
Platelets: 272 10*3/uL (ref 150–400)
RBC: 4.55 MIL/uL (ref 3.87–5.11)
RDW: 16.4 % — ABNORMAL HIGH (ref 11.5–15.5)
WBC: 8 10*3/uL (ref 4.0–10.5)
nRBC: 0 % (ref 0.0–0.2)

## 2018-06-09 NOTE — Progress Notes (Signed)
Patient transported to [facility] by [place]. Family at bedside. Assessments remained unchanged prior to d/ced.

## 2018-06-09 NOTE — Care Management Note (Signed)
Case Management Note  Patient Details  Name: Alice Hayes MRN: [id number] Date of Birth: 19-Oct-1922  Subjective/Objective:  85 yo female presented with a fall and hypoxia.                   Action/Plan: CM following for dispositional needs. Patient presented from [place] [facility] with CSW consulted for return to facility. Patient will require home O2 arrangement. CM met with daughter who's agreeable. DME preference provided with [facility] to service home O2; AVS updated. No further needs from CM.   Expected Discharge Date:  06/09/18               Expected Discharge Plan:  Assisted Living / Rest Home  In-House Referral:  Clinical Social Work  Discharge planning Services  CM Consult  Post Acute Care Choice:  Durable Medical Equipment Choice offered to:  Adult Children  DME Arranged:  Oxygen DME Agency:  [facility] Arranged:  NA [place] Agency:  NA  Status of Service:  Completed, signed off  If discussed at [place] of Stay Meetings, dates discussed:    Additional Comments:  Midge Minium RN, BSN, NCM-BC, ACM-RN [phone] 06/09/2018, 1:56 PM

## 2018-06-09 NOTE — Discharge Summary (Addendum)
Alice Hayes, is a 85 y.o. female  DOB [birth year]  MRN [id number].  Admission date:  06/08/2018  Admitting Physician  Barb Merino, MD  Discharge Date:  06/09/2018   Primary MD  Vernie Shanks, MD  Recommendations for primary care physician for things to follow:  - Recommendation for outpatient palliative/hospice care consult - Follow-up pending labs and/or cultures  Admission Diagnosis  Pericardial effusion [I31.3] Hypoxia [R09.02] Pleural effusion, left [J90] Injury of head, initial encounter [S09.90XA] Fall, initial encounter [W19.XXXA] Skin tear of right hand without complication, initial encounter [S61.411A] Pneumonia of left lower lobe due to infectious organism [facility]) [J18.1]   Discharge Diagnosis  Pericardial effusion [I31.3] Hypoxia [R09.02] Pleural effusion, left [J90] Injury of head, initial encounter [S09.90XA] Fall, initial encounter [W19.XXXA] Skin tear of right hand without complication, initial encounter [S61.411A] Pneumonia of left lower lobe due to infectious organism [facility]) [J18.1]    Principal Problem:   Healthcare-associated pneumonia Active Problems:   Hypothyroidism   Dementia ([place])   Hyperlipidemia   Hypertension   Acute UTI   Pericardial effusion   Chronic respiratory failure with hypoxia (HCC)   Pleural effusion   Hypoxia      Past Medical History:  Diagnosis Date  . CAD (coronary artery disease)    nonobstructive cath 2006  . Cholelithiases   . COPD (chronic obstructive pulmonary disease) ([place])   . Dementia ([place])   . Diastolic heart failure ([place])   . HTN (hypertension)   . Hyperlipidemia   . Melanoma ([place])   . Osteoarthritis   . Osteoporosis   . Pubic ramus fracture ([place]) 12/01/2017    Past Surgical History:  Procedure Laterality Date  . APPENDECTOMY    . BLADDER SUSPENSION     2006  . BREAST LUMPECTOMY    . CARPAL  TUNNEL RELEASE     2007  . CATARACT EXTRACTION    . CHOLECYSTECTOMY    . LYMPH NODE BIOPSY    . MELANOMA EXCISION     2008  . PAROTIDECTOMY     1959  . TONSILLECTOMY AND ADENOIDECTOMY         HPI  from the history and physical done on the day of admission:     Alice Delancy Rabinovitzis an 85 y.o.femaleHelen J Rabinovitzis a 85 y.o.femalewith medical history significant ofdementia, history of melanoma, chronic pericardial effusion as well as left-sided pleural effusion, chronic diastolic heart failure, UTI currently on treatment who was discharged from [facility] hospital 06/07/2018 after treatment for UTI and pericardial effusion. She was discharged to a skilled nursing facility. She attempted walking today without supervision, fell on the floor with some injuries. Patient was found to be 86% on room air on examination and transferred to ER. Patient was seen in the emergency room. Her only complaint is shortness of breath on ambulating. Patient is poor historian due to dementia. She denies any other complaints other than not liking to have injections and IV lines to be open.  I metpatients daughter at the bedside. I  reviewed patient's hospitalization from [facility] and discharge summary. Staff at the facility believed that patient was trying to go to the bathroom without her walker when she tripped and fell. Patient denied any pain. No back pain no neck pain. On arrival, patient was noted to be saturating 88% on room air, 2 L nasal cannula was applied. Other than a recent hospitalization with encephalopathy and UTI, no other new symptoms noted. Her pericardial effusion was noted to be large for echocardiogram significant, however patient and family decided not to go for pericardiocentesis after consultation with cardiology. They decided conservative management.    Hospital Course:   1. PossibleHealthcare associated pneumonia with pleural effusion: Acute. Patient suspected to  have healthcare associated pneumonia after chest x-rays showed left lower lobe opacification thought to be related to consolidation versus effusion. Patient was initially ordered vancomycin and cefepime,but appears to have only been given cefepime once.Agmentinthat the patientwas already taking for aUTIwas continued. Blood cultures revealedno signs of growth<24 hours.It appears that thoracentesis may have been attempted last night,but was unsuccessful. Family did not want to pursue aggressive therapies and discussed hospice. Will continue patient on amoxicillin previously prescribed for UTI to complete course and for possible pneumonia.  Hypoxia and shortness of breath: Acute. Initial O2 saturation was noted to be as low as 86% on room air. Patient with previous echocardiogram noting EF of 60 to 40%, grade 1 diastolic dysfunction, and large pericardial effusion with findings concerning for intra-pericardial pressure.Patient was placed on 2 L of oxygen.with improvement in O2 saturations. - Orders placed for portable oxygen at discharge  UTI: Present prior to admission. Patient was already receiving antibiotics of amoxicillin. Urine culture from 1/2 positive for E. coli that was pansensitive. -Continue amoxicillin to complete course  Hypothyroidism:Currently euthyroid on supplement. Continuelevothyroxine  Dementia: Patient on moderate to self only and lives in the memory ward..  Pericardial effusion: Previous echocardiogram noting large pericardial effusion.  Family did not want to undergo any aggressive therapies or treatments.  Follow UP   Contact information for follow-up providers    [place] Follow up.   Why:  Home O2 Contact information: [address] [place] [address] [phone]            Contact information for after-discharge care    Destination    [facility] .   Service:  Assisted Living Contact  information: [address] [place] obtained -none  Discharge Condition: Stable  Diet and Activity recommendation: See Discharge Instructions below  Discharge Instructions    Please arrange consults with palliative care as outpatient  Discharge Instructions    Diet - low sodium heart healthy   Complete by:  As directed    Discharge instructions   Complete by:  As directed    Follow-up with MD at nursing facility. Family to set up appointment with palliative care   Increase activity slowly   Complete by:  As directed         Discharge Medications     Allergies as of 06/09/2018      Reactions   Voltaren Ophthalmic [diclofenac] Other (See Comments)   Reaction not recalled by family ??      Medication List    TAKE these medications   ALPRAZolam 0.25 MG tablet Commonly known as:  XANAX Take 0.25 mg  by mouth 2 (two) times daily as needed for anxiety.   amoxicillin 875 MG tablet Commonly known as:  AMOXIL Take 1 tablet (875 mg total) by mouth 2 (two) times daily for 3 days.   aspirin 81 MG chewable tablet Chew 81 mg by mouth 2 (two) times daily.   bisacodyl 5 MG EC tablet Commonly known as:  DULCOLAX Take 1 tablet (5 mg total) by mouth daily as needed for moderate constipation.   CALCIUM 600+D3 600-200 MG-UNIT Tabs Generic drug:  Calcium Carb-Cholecalciferol Take 1 tablet by mouth daily.   citalopram 20 MG tablet Commonly known as:  CELEXA Take 20 mg by mouth daily.   Co Q 10 10 MG Caps Take 10 mg by mouth daily.   HYDROcodone-acetaminophen 5-325 MG tablet Commonly known as:  NORCO Take 0.5 tablets by mouth every 6 (six) hours as needed for moderate pain or severe pain.   ipratropium-albuterol 0.5-2.5 (3) MG/3ML Soln Commonly known as:  DUONEB Take 3 mLs by nebulization 2 (two) times daily. What changed:    when to take this  reasons to take this   NP THYROID 60 MG  tablet Generic drug:  thyroid Take 60 mg by mouth daily.   OVER THE COUNTER MEDICATION Take 1 tablet by mouth daily. "ICAPS Multivitamin + Lutein tablets: Take 1 tablet by mouth once a day   polyethylene glycol packet Commonly known as:  MIRALAX / GLYCOLAX Take 17 g by mouth daily.   senna-docusate 8.6-50 MG tablet Commonly known as:  Senokot-S Take 1 tablet by mouth at bedtime.   Vitamin D-3 25 MCG (1000 UT) Caps Take 1,000 Units by mouth daily.            Durable Medical Equipment  (From admission, onward)         Start     Ordered   06/09/18 1514  DME Oxygen  Once    Question Answer Comment  Mode or (Route) Nasal cannula   Liters per Minute 2   Frequency Continuous (stationary and portable oxygen unit needed)   Oxygen conserving device Yes   Oxygen delivery system Gas      06/09/18 1513          Major procedures and Radiology Reports - PLEASE review detailed and final reports for all details, in brief -   Dg Chest 2 View  Result Date: 06/08/2018 CLINICAL DATA:  Shortness of breath with fall EXAM: CHEST - 2 VIEW COMPARISON:  June 02, 2018 FINDINGS: There is persistent opacity in the left lower lobe with small left pleural effusion. Lungs elsewhere are clear. Heart is enlarged with pulmonary vascularity normal. No adenopathy. There is aortic atherosclerosis. There is a sclerotic focus in the proximal left humerus, stable. No pneumothorax. IMPRESSION: Opacity concerning for pneumonia left lower lobe with left pleural effusion. Lungs elsewhere clear. Stable cardiomegaly. There is aortic atherosclerosis. No evident pneumothorax. Sclerotic focus consistent with either enchondroma or bone infarct proximal left humerus. Aortic Atherosclerosis (ICD10-I70.0). Electronically Signed   By: Lowella Grip III M.D.   On: 06/08/2018 07:24   Ct Head Wo Contrast  Result Date: 06/08/2018 CLINICAL DATA:  Unwitnessed fall EXAM: CT HEAD WITHOUT CONTRAST CT CERVICAL SPINE WITHOUT  CONTRAST TECHNIQUE: Multidetector CT imaging of the head and cervical spine was performed following the standard protocol without intravenous contrast. Multiplanar CT image reconstructions of the cervical spine were also generated. COMPARISON:  11/01/2017 FINDINGS: CT HEAD FINDINGS Brain: There is atrophy and chronic small vessel disease changes. No  acute intracranial abnormality. Specifically, no hemorrhage, hydrocephalus, mass lesion, acute infarction, or significant intracranial injury. Vascular: No hyperdense vessel or unexpected calcification. Skull: No acute calvarial abnormality. Sinuses/Orbits: Visualized paranasal sinuses and mastoids clear. Orbital soft tissues unremarkable. Other: None CT CERVICAL SPINE FINDINGS Alignment: Slight anterolisthesis of C6 on C7 and C7 on T1 felt to be related to facet disease. Skull base and vertebrae: No acute fracture. No primary bone lesion or focal pathologic process. Soft tissues and spinal canal: No prevertebral fluid or swelling. No visible canal hematoma. Disc levels: Diffuse degenerative disc disease, most pronounced at C5-6. Diffuse bilateral degenerative facet disease. Upper chest: No acute findings Other: None IMPRESSION: Atrophy, chronic microvascular disease. No acute intracranial abnormality. No acute bony abnormality in the cervical spine. Degenerative disc and facet disease. Electronically Signed   By: Rolm Baptise M.D.   On: 06/08/2018 08:35   Ct Chest Wo Contrast  Result Date: 06/08/2018 CLINICAL DATA:  85 year old female with history of chest pain. Altered mental status. Abnormal chest x-ray. EXAM: CT CHEST WITHOUT CONTRAST TECHNIQUE: Multidetector CT imaging of the chest was performed following the standard protocol without IV contrast. COMPARISON:  No priors. FINDINGS: Cardiovascular: Heart size is normal. Moderate to large pericardial effusion. No pericardial calcification. Calcifications of the aortic valve. There is aortic atherosclerosis, as well  as atherosclerosis of the great vessels of the mediastinum and the coronary arteries, including calcified atherosclerotic plaque in the left main, left anterior descending, left circumflex and right coronary arteries. Mediastinum/Nodes: No pathologically enlarged mediastinal or hilar lymph nodes. Please note that accurate exclusion of hilar adenopathy is limited on noncontrast CT scans. Esophagus is unremarkable in appearance. No axillary lymphadenopathy. Lungs/Pleura: Assessment of the lungs is limited by considerable patient motion. With this limitation in mind, no definite suspicious appearing pulmonary nodules or masses are noted. No acute consolidative airspace disease. There is a mosaic attenuation throughout the lung parenchyma with areas of mild ground-glass attenuation interspersed with lucency, which could suggest the presence of air trapping. Small bilateral pleural effusions lying dependently with associated passive subsegmental atelectasis in the lower lobes of the lungs bilaterally. Partial collapse of the trachea and mainstem bronchi indicative of tracheobronchomalacia. Upper Abdomen: Aortic atherosclerosis. Heterogeneous appearance of the liver with at least 2 hypoattenuating lesions, largest of which is in the right lobe of the liver estimated to measure approximately 3.6 x 3.8 x 3.8 cm (axial image 120 of series 3 and coronal image 65 of series 6) with several internal calcifications associated with this lesion. Status post cholecystectomy. Aortic atherosclerosis. Musculoskeletal: Sclerotic lesion with apparent chondroid matrix in the left proximal humerus, favored to represent an enchondroma (alternatively, this could represent an old bone infarct), stable compared to numerous prior radiographs. IMPRESSION: 1. Moderate to large pericardial effusion. Clinical correlation for signs and symptoms of pericarditis is suggested. 2. Small bilateral pleural effusions lying dependently with some associated  passive subsegmental atelectasis in the lower lobes of the lungs bilaterally. 3. Probable air trapping, indicative of small airways disease. In addition there is evidence of tracheobronchomalacia. 4. There are at least 2 liver lesions which are incompletely characterized on today's noncontrast CT examination. The possibility of malignancy should be considered, and further characterization with nonemergent MRI of the abdomen with and without IV gadolinium should be considered to provide definitive characterization once the patient is clinically stabilized and able to adequately hold her breath for the examination. 5. Aortic atherosclerosis, in addition to left main and 3 vessel coronary artery disease. 6. There are  calcifications of the aortic valve. Echocardiographic correlation for evaluation of potential valvular dysfunction may be warranted if clinically indicated. Aortic Atherosclerosis (ICD10-I70.0). Electronically Signed   By: Vinnie Langton M.D.   On: 06/08/2018 13:53   Ct Cervical Spine Wo Contrast  Result Date: 06/08/2018 CLINICAL DATA:  Unwitnessed fall EXAM: CT HEAD WITHOUT CONTRAST CT CERVICAL SPINE WITHOUT CONTRAST TECHNIQUE: Multidetector CT imaging of the head and cervical spine was performed following the standard protocol without intravenous contrast. Multiplanar CT image reconstructions of the cervical spine were also generated. COMPARISON:  11/01/2017 FINDINGS: CT HEAD FINDINGS Brain: There is atrophy and chronic small vessel disease changes. No acute intracranial abnormality. Specifically, no hemorrhage, hydrocephalus, mass lesion, acute infarction, or significant intracranial injury. Vascular: No hyperdense vessel or unexpected calcification. Skull: No acute calvarial abnormality. Sinuses/Orbits: Visualized paranasal sinuses and mastoids clear. Orbital soft tissues unremarkable. Other: None CT CERVICAL SPINE FINDINGS Alignment: Slight anterolisthesis of C6 on C7 and C7 on T1 felt to be  related to facet disease. Skull base and vertebrae: No acute fracture. No primary bone lesion or focal pathologic process. Soft tissues and spinal canal: No prevertebral fluid or swelling. No visible canal hematoma. Disc levels: Diffuse degenerative disc disease, most pronounced at C5-6. Diffuse bilateral degenerative facet disease. Upper chest: No acute findings Other: None IMPRESSION: Atrophy, chronic microvascular disease. No acute intracranial abnormality. No acute bony abnormality in the cervical spine. Degenerative disc and facet disease. Electronically Signed   By: Rolm Baptise M.D.   On: 06/08/2018 08:35   Dg Acute Abd 2+v Abd (supine,erect,decub) + 1v Chest  Result Date: 06/02/2018 CLINICAL DATA:  Vomiting EXAM: DG ABDOMEN ACUTE W/ 1V CHEST COMPARISON:  12/02/2017, chest x-ray 04/06/2015 FINDINGS: Single view chest demonstrates cardiomegaly with vascular congestion. Probable small pleural effusions. Aortic atherosclerosis. No pneumothorax. Stable sclerotic lesion in the proximal left humerus. Supine and upright views of the abdomen demonstrate no free air beneath the diaphragm. Surgical clips in the right upper quadrant. Nonobstructed bowel-gas pattern. Extensive vascular calcifications. IMPRESSION: 1. Cardiomegaly with vascular congestion and probable trace pleural effusion 2. Nonobstructed bowel-gas pattern Electronically Signed   By: Donavan Foil M.D.   On: 06/02/2018 21:15   Ir US Chest  Result Date: 06/08/2018 CLINICAL DATA:  Hypoxia.  Evaluate for left pleural effusion. EXAM: CHEST ULTRASOUND COMPARISON:  Chest radiograph 06/08/2017 FINDINGS: Left side of the chest was evaluated with ultrasound. No significant pleural fluid was identified. IMPRESSION: No significant pleural fluid identified in the left chest. Electronically Signed   By: Markus Daft M.D.   On: 06/08/2018 16:34    Micro Results    Recent Results (from the past 240 hour(s))  Urine Culture     Status: Abnormal   Collection  Time: 06/02/18  7:42 PM  Result Value Ref Range Status   Specimen Description   Final    URINE, CLEAN CATCH Performed at [facility], [place] [address], [place], [place] [address]    Special Requests   Final    NONE Performed at [facility], [place] [address] [place], [place] [address]    Culture >=100,000 COLONIES/mL ESCHERICHIA COLI (A)  Final   Report Status 06/05/2018 FINAL  Final   Organism ID, Bacteria ESCHERICHIA COLI (A)  Final      Susceptibility   Escherichia coli - MIC*    AMPICILLIN 8 SENSITIVE Sensitive     CEFAZOLIN <=4 SENSITIVE Sensitive     CEFTRIAXONE <=1 SENSITIVE Sensitive     CIPROFLOXACIN <=0.25  SENSITIVE Sensitive     GENTAMICIN <=1 SENSITIVE Sensitive     IMIPENEM <=0.25 SENSITIVE Sensitive     NITROFURANTOIN <=16 SENSITIVE Sensitive     TRIMETH/SULFA <=20 SENSITIVE Sensitive     AMPICILLIN/SULBACTAM 4 SENSITIVE Sensitive     PIP/TAZO <=4 SENSITIVE Sensitive     Extended ESBL NEGATIVE Sensitive     * >=100,000 COLONIES/mL ESCHERICHIA COLI  Culture, blood (x 2)     Status: None   Collection Time: 06/03/18 12:20 AM  Result Value Ref Range Status   Specimen Description   Final    BLOOD LEFT ARM Performed at [facility], [place] [address] [place], [place] [address]    Special Requests   Final    BOTTLES DRAWN AEROBIC AND ANAEROBIC Blood Culture adequate volume Performed at [place] [address], [place], [place] [address]    Culture   Final    NO GROWTH 5 DAYS Performed at [facility], [place] [address], [place], [place] [address]    Report Status 06/08/2018 FINAL  Final  Culture, blood (x 2)     Status: None   Collection Time: 06/03/18 12:20 AM  Result Value Ref Range Status   Specimen Description   Final    BLOOD RIGHT HAND Performed at [facility], [place] [address] [place], [place] [address]    Special Requests   Final    BOTTLES DRAWN AEROBIC AND ANAEROBIC Blood  Culture adequate volume Performed at [place] [address] [place], [place] [address]    Culture   Final    NO GROWTH 5 DAYS Performed at [facility], [place] [address] [place], [place] [address]    Report Status 06/08/2018 FINAL  Final  MRSA PCR Screening     Status: None   Collection Time: 06/03/18  8:40 AM  Result Value Ref Range Status   MRSA by PCR NEGATIVE NEGATIVE Final    Comment:        The GeneXpert MRSA Assay (FDA approved for NASAL specimens only), is one component of a comprehensive MRSA colonization surveillance program. It is not intended to diagnose MRSA infection nor to guide or monitor treatment for MRSA infections. Performed at [facility], [place] [address] [place], [place] [address]   Blood culture (routine x 2)     Status: None (Preliminary result)   Collection Time: 06/08/18  8:50 AM  Result Value Ref Range Status   Specimen Description BLOOD RIGHT ANTECUBITAL  Final   Special Requests   Final    BOTTLES DRAWN AEROBIC AND ANAEROBIC Blood Culture results may not be optimal due to an inadequate volume of blood received in culture bottles   Culture   Final    NO GROWTH 1 DAY Performed at [facility], [place] [address] [place], [place] [address]    Report Status PENDING  Incomplete  Blood culture (routine x 2)     Status: None (Preliminary result)   Collection Time: 06/08/18  9:11 AM  Result Value Ref Range Status   Specimen Description BLOOD LEFT ANTECUBITAL  Final   Special Requests   Final    BOTTLES DRAWN AEROBIC AND ANAEROBIC Blood Culture adequate volume   Culture   Final    NO GROWTH 1 DAY Performed at [facility], [place] [address] [place], [place] [address]    Report Status PENDING  Incomplete  Today   Subjective    Alice Hayes today appears to have no complaints at this time and is currently resting.    Objective   Blood pressure (!) 155/80, pulse 68,  temperature 97.8 F (36.6 C), temperature source Oral, resp. rate 19, height _0  (1.499 m), weight 54.4 kg, SpO2 93 %.  No intake or output data in the 24 hours ending 06/09/18 1841  Exam Elderly frail lady appears to be in no acute distress at this time oriented only to self. Supple Neck, No JVD, No cervical lymphadenopathy appriciated.  Symmetrical Chest wall movement, Good air movement bilaterally, CTAB RRR,No Gallops,Rubs or new Murmurs, No Parasternal Heave +ve B.Sounds, Abd Soft, Non tender, No organomegaly appriciated, No rebound -guarding or rigidity. No Cyanosis, Clubbing or edema, No new Rash or bruise   Data Review   CBC w Diff:  Lab Results  Component Value Date   WBC 8.0 06/09/2018   HGB 13.8 06/09/2018   HCT 42.5 06/09/2018   PLT 272 06/09/2018   LYMPHOPCT 15 06/08/2018   MONOPCT 7 06/08/2018   EOSPCT 4 06/08/2018   BASOPCT 1 06/08/2018    CMP:  Lab Results  Component Value Date   NA 142 06/08/2018   K 3.1 (L) 06/08/2018   CL 103 06/08/2018   CO2 30 06/08/2018   BUN 8 06/08/2018   CREATININE 0.64 06/08/2018   PROT 5.7 (L) 06/03/2018   ALBUMIN 3.1 (L) 06/03/2018   BILITOT 1.0 06/03/2018   ALKPHOS 39 06/03/2018   AST 18 06/03/2018   ALT 13 06/03/2018  .   Total Time in preparing paper work, data evaluation and todays exam - 35 minutes  Norval Morton M.D on 06/09/2018 at 6:41 PM  [facility]   Office  [phone]

## 2018-06-09 NOTE — Progress Notes (Addendum)
  SATURATION QUALIFICATIONS:  Patient Saturations on Room Air at Rest = 88%  Patient Saturations on 2 Liters of oxygen at rest = 92%

## 2018-06-09 NOTE — NC FL2 (Signed)
[place] LEVEL OF CARE SCREENING TOOL     IDENTIFICATION  Patient Name: Alice Hayes Birthdate: Dec 17, 1922 Sex: female Admission Date (Current Location): 06/08/2018  [facility] and [place] Number:  Herbalist and Address:  [facility], [place] [address] [place], [place] [address]      Provider Number: [id number]  Attending Physician Name and Address:  Norval Morton, MD  Relative Name and Phone Number:  [name], [phone]    Current Level of Care: Hospital Recommended Level of Care: [place] Prior Approval Number:    Date Approved/Denied:   PASRR Number:    Discharge Plan: Domiciliary (Rest home)(ALF)    Current Diagnoses: Patient Active Problem List   Diagnosis Date Noted  . Chronic respiratory failure with hypoxia ([place]) 06/08/2018  . Pleural effusion 06/08/2018  . Healthcare-associated pneumonia 06/08/2018  . Hypoxia 06/08/2018  . Hypernatremia   . Acute UTI   . Pericardial effusion   . Sepsis ([place]) 06/02/2018  . Pyelonephritis 06/02/2018  . Dehydration 06/02/2018  . Intractable nausea and vomiting 06/02/2018  . Hypothyroidism 12/01/2017  . Dementia ([place]) 12/01/2017  . Hyperlipidemia 12/01/2017  . Fracture of pubic ramus ([place]) 12/01/2017  . Hypertension 12/01/2017    Orientation RESPIRATION BLADDER Height & Weight     Self  O2(nasal cannula 2L) Incontinent Weight: 54.4 kg Height:  4\' 11"  (149.9 cm)  BEHAVIORAL SYMPTOMS/MOOD NEUROLOGICAL BOWEL NUTRITION STATUS      Continent Diet(regular)  AMBULATORY STATUS COMMUNICATION OF NEEDS Skin   (baseline) Verbally Normal                       Personal Care Assistance Level of Assistance  Bathing, Feeding, Dressing Bathing Assistance: (baseline) Feeding assistance: (baseline) Dressing Assistance: (baseline)     Functional Limitations Info  Sight, Hearing, Speech Sight Info: Adequate Hearing Info: Adequate Speech Info:  Adequate    SPECIAL CARE FACTORS FREQUENCY                       Contractures Contractures Info: Not present    Additional Factors Info  Code Status, Allergies Code Status Info: DNR Allergies Info: Voltaren Ophthalmic Diclofenac           Current Medications (06/09/2018):  This is the current hospital active medication list Current Facility-Administered Medications  Medication Dose Route Frequency Provider Last Rate Last Dose  . 0.9 %  sodium chloride infusion   Intravenous PRN Ward, Delice Bison, DO   Stopped at 06/08/18 1021  . acetaminophen (TYLENOL) tablet 650 mg  650 mg Oral Q6H PRN Barb Merino, MD       Or  . acetaminophen (TYLENOL) suppository 650 mg  650 mg Rectal Q6H PRN Barb Merino, MD      . ALPRAZolam Duanne Moron) tablet 0.25 mg  0.25 mg Oral BID PRN Barb Merino, MD   0.25 mg at 06/08/18 2000  . amoxicillin (AMOXIL) capsule 500 mg  500 mg Oral TID Barb Merino, MD   500 mg at 06/09/18 1048  . aspirin chewable tablet 81 mg  81 mg Oral BID Barb Merino, MD   81 mg at 06/09/18 1048  . bisacodyl (DULCOLAX) EC tablet 5 mg  5 mg Oral Daily PRN Barb Merino, MD   5 mg at 06/08/18 1739  . calcium-vitamin D (OSCAL WITH D) 500-200 MG-UNIT per tablet 1 tablet  1 tablet Oral Q breakfast Barb Merino, MD   1  tablet at 06/09/18 0825  . cholecalciferol (VITAMIN D3) tablet 1,000 Units  1,000 Units Oral Daily Barb Merino, MD   1,000 Units at 06/08/18 1254  . citalopram (CELEXA) tablet 20 mg  20 mg Oral Daily Barb Merino, MD   20 mg at 06/09/18 1048  . enoxaparin (LOVENOX) injection 40 mg  40 mg Subcutaneous Q24H Barb Merino, MD   40 mg at 06/08/18 2001  . HYDROcodone-acetaminophen (NORCO/VICODIN) 5-325 MG per tablet 0.5 tablet  0.5 tablet Oral Q6H PRN Barb Merino, MD      . ipratropium-albuterol (DUONEB) 0.5-2.5 (3) MG/3ML nebulizer solution 3 mL  3 mL Nebulization BID PRN Barb Merino, MD      . polyethylene glycol (MIRALAX / GLYCOLAX) packet 17 g  17 g Oral  Daily Barb Merino, MD   17 g at 06/09/18 1048  . senna-docusate (Senokot-S) tablet 1 tablet  1 tablet Oral QHS Barb Merino, MD   1 tablet at 06/08/18 2223  . thyroid (ARMOUR) tablet 60 mg  60 mg Oral Daily Barb Merino, MD   60 mg at 06/09/18 1048     Discharge Medications: Please see discharge summary for a list of discharge medications.  Relevant Imaging Results:  Relevant Lab Results:   Additional Information SSN: [id number]  Estanislado Emms, LCSW

## 2018-06-09 NOTE — Clinical Social Work Note (Signed)
Clinical Social Work Assessment  Patient Details  Name: Alice Hayes MRN: [id number] Date of Birth: 1922-07-28  Date of referral:  06/09/18               Reason for consult:  Facility Placement, Discharge Planning                Permission sought to share information with:  Facility Sport and exercise psychologist, Family Supports Permission granted to share information::  No(patient not oriented; sleeping and daughter at bedside)  Name::     Robin  Agency::  [facility]/memory care  Relationship::  daughter  Contact Information:     Housing/Transportation Living arrangements for the past 2 months:  [place] of Information:  Adult Children Patient Interpreter Needed:  None Criminal Activity/Legal Involvement Pertinent to Current Situation/Hospitalization:  No - Comment as needed Significant Relationships:  Adult Children Lives with:  Facility Resident Do you feel safe going back to the place where you live?  Yes Need for family participation in patient care:  Yes (Comment)  Care giving concerns: Patient from [facility]. CSW consulted for planned discharge today.   Social Worker assessment / plan: CSW met with patient's daughter at bedside. Patient asleep. CSW introduced self and role and discussed disposition planning.   Daughter agreeable for patient to discharge back to her memory care unit at [facility]. [facility] can accept patient back today.  CSW to follow and support with discharge. RNCM to arrange new home O2.   Employment status:  Retired Forensic scientist:  Medicare PT Recommendations:  Not assessed at this time Information / Referral to community resources:     Patient/Family's Response to care: Daughter appreciative of care.  Patient/Family's Understanding of and Emotional Response to Diagnosis, Current Treatment, and Prognosis: Daughter with good understanding of patient's condition and agreeable for patient to return to ALF  today.  Emotional Assessment Appearance:  Appears stated age Attitude/Demeanor/Rapport:  Unable to Assess Affect (typically observed):  Unable to Assess Orientation:  Oriented to Self Alcohol / Substance use:  Not Applicable Psych involvement (Current and /or in the community):  No (Comment)  Discharge Needs  Concerns to be addressed:  Discharge Planning Concerns, Care Coordination Readmission within the last 30 days:  Yes Current discharge risk:  Cognitively Impaired Barriers to Discharge:  No Barriers Identified   Estanislado Emms, LCSW 06/09/2018, 2:35 PM

## 2018-06-09 NOTE — Progress Notes (Signed)
Report given to [facility] facility.

## 2018-06-09 NOTE — Social Work (Signed)
Patient will discharge back to [facility]/memorycare. Anticipated discharge date: 06/09/18 Family notified: Shirlean Mylar, daughter at bedside Transportation by: [facility]  Nurse to call report to [phone].  CSW signing off.  Estanislado Emms, [place]  Clinical Social Worker

## 2018-06-09 NOTE — Care Management Obs Status (Signed)
[place] NOTIFICATION   Patient Details  Name: Alice Hayes MRN: [id number] Date of Birth: [birth year]   Medicare Observation Status Notification Given:  Yes Patients daughter signed d/t the patient having dementia.    Midge Minium RN, BSN, NCM-BC, ACM-RN [phone] 06/09/2018, 2:33 PM

## 2018-06-13 DIAGNOSIS — M174 Other bilateral secondary osteoarthritis of knee: Secondary | ICD-10-CM | POA: Diagnosis not present

## 2018-06-13 DIAGNOSIS — Z79899 Other long term (current) drug therapy: Secondary | ICD-10-CM | POA: Diagnosis not present

## 2018-06-13 DIAGNOSIS — E039 Hypothyroidism, unspecified: Secondary | ICD-10-CM | POA: Diagnosis not present

## 2018-06-13 DIAGNOSIS — K5901 Slow transit constipation: Secondary | ICD-10-CM | POA: Diagnosis not present

## 2018-06-13 DIAGNOSIS — F331 Major depressive disorder, recurrent, moderate: Secondary | ICD-10-CM | POA: Diagnosis not present

## 2018-06-13 LAB — CULTURE, BLOOD (ROUTINE X 2)
Culture: NO GROWTH
Culture: NO GROWTH
Special Requests: ADEQUATE

## 2018-06-16 DIAGNOSIS — Z9181 History of falling: Secondary | ICD-10-CM | POA: Diagnosis not present

## 2018-06-16 DIAGNOSIS — R278 Other lack of coordination: Secondary | ICD-10-CM | POA: Diagnosis not present

## 2018-06-16 DIAGNOSIS — R2689 Other abnormalities of gait and mobility: Secondary | ICD-10-CM | POA: Diagnosis not present

## 2018-06-16 DIAGNOSIS — R296 Repeated falls: Secondary | ICD-10-CM | POA: Diagnosis not present

## 2018-06-16 DIAGNOSIS — M6281 Muscle weakness (generalized): Secondary | ICD-10-CM | POA: Diagnosis not present

## 2018-06-17 DIAGNOSIS — R296 Repeated falls: Secondary | ICD-10-CM | POA: Diagnosis not present

## 2018-06-17 DIAGNOSIS — R278 Other lack of coordination: Secondary | ICD-10-CM | POA: Diagnosis not present

## 2018-06-17 DIAGNOSIS — Z9181 History of falling: Secondary | ICD-10-CM | POA: Diagnosis not present

## 2018-06-17 DIAGNOSIS — M6281 Muscle weakness (generalized): Secondary | ICD-10-CM | POA: Diagnosis not present

## 2018-06-17 DIAGNOSIS — R2689 Other abnormalities of gait and mobility: Secondary | ICD-10-CM | POA: Diagnosis not present

## 2018-06-20 DIAGNOSIS — M6281 Muscle weakness (generalized): Secondary | ICD-10-CM | POA: Diagnosis not present

## 2018-06-20 DIAGNOSIS — R278 Other lack of coordination: Secondary | ICD-10-CM | POA: Diagnosis not present

## 2018-06-20 DIAGNOSIS — Z9181 History of falling: Secondary | ICD-10-CM | POA: Diagnosis not present

## 2018-06-20 DIAGNOSIS — R296 Repeated falls: Secondary | ICD-10-CM | POA: Diagnosis not present

## 2018-06-20 DIAGNOSIS — R2689 Other abnormalities of gait and mobility: Secondary | ICD-10-CM | POA: Diagnosis not present

## 2018-06-21 DIAGNOSIS — I5030 Unspecified diastolic (congestive) heart failure: Secondary | ICD-10-CM | POA: Diagnosis not present

## 2018-06-21 DIAGNOSIS — E039 Hypothyroidism, unspecified: Secondary | ICD-10-CM | POA: Diagnosis not present

## 2018-06-21 DIAGNOSIS — R2689 Other abnormalities of gait and mobility: Secondary | ICD-10-CM | POA: Diagnosis not present

## 2018-06-21 DIAGNOSIS — J189 Pneumonia, unspecified organism: Secondary | ICD-10-CM | POA: Diagnosis not present

## 2018-06-21 DIAGNOSIS — Z86006 Personal history of melanoma in-situ: Secondary | ICD-10-CM | POA: Diagnosis not present

## 2018-06-21 DIAGNOSIS — I25119 Atherosclerotic heart disease of native coronary artery with unspecified angina pectoris: Secondary | ICD-10-CM | POA: Diagnosis not present

## 2018-06-21 DIAGNOSIS — J449 Chronic obstructive pulmonary disease, unspecified: Secondary | ICD-10-CM | POA: Diagnosis not present

## 2018-06-21 DIAGNOSIS — E785 Hyperlipidemia, unspecified: Secondary | ICD-10-CM | POA: Diagnosis not present

## 2018-06-21 DIAGNOSIS — M6281 Muscle weakness (generalized): Secondary | ICD-10-CM | POA: Diagnosis not present

## 2018-06-21 DIAGNOSIS — R278 Other lack of coordination: Secondary | ICD-10-CM | POA: Diagnosis not present

## 2018-06-21 DIAGNOSIS — F0390 Unspecified dementia without behavioral disturbance: Secondary | ICD-10-CM | POA: Diagnosis not present

## 2018-06-21 DIAGNOSIS — J9611 Chronic respiratory failure with hypoxia: Secondary | ICD-10-CM | POA: Diagnosis not present

## 2018-06-21 DIAGNOSIS — R296 Repeated falls: Secondary | ICD-10-CM | POA: Diagnosis not present

## 2018-06-21 DIAGNOSIS — Z9181 History of falling: Secondary | ICD-10-CM | POA: Diagnosis not present

## 2018-06-21 DIAGNOSIS — I313 Pericardial effusion (noninflammatory): Secondary | ICD-10-CM | POA: Diagnosis not present

## 2018-06-21 DIAGNOSIS — I10 Essential (primary) hypertension: Secondary | ICD-10-CM | POA: Diagnosis not present

## 2018-06-21 DIAGNOSIS — J918 Pleural effusion in other conditions classified elsewhere: Secondary | ICD-10-CM | POA: Diagnosis not present

## 2018-06-22 DIAGNOSIS — I313 Pericardial effusion (noninflammatory): Secondary | ICD-10-CM | POA: Diagnosis not present

## 2018-06-22 DIAGNOSIS — R278 Other lack of coordination: Secondary | ICD-10-CM | POA: Diagnosis not present

## 2018-06-22 DIAGNOSIS — J9611 Chronic respiratory failure with hypoxia: Secondary | ICD-10-CM | POA: Diagnosis not present

## 2018-06-22 DIAGNOSIS — J189 Pneumonia, unspecified organism: Secondary | ICD-10-CM | POA: Diagnosis not present

## 2018-06-22 DIAGNOSIS — J449 Chronic obstructive pulmonary disease, unspecified: Secondary | ICD-10-CM | POA: Diagnosis not present

## 2018-06-22 DIAGNOSIS — R2689 Other abnormalities of gait and mobility: Secondary | ICD-10-CM | POA: Diagnosis not present

## 2018-06-22 DIAGNOSIS — J918 Pleural effusion in other conditions classified elsewhere: Secondary | ICD-10-CM | POA: Diagnosis not present

## 2018-06-22 DIAGNOSIS — M6281 Muscle weakness (generalized): Secondary | ICD-10-CM | POA: Diagnosis not present

## 2018-06-22 DIAGNOSIS — R296 Repeated falls: Secondary | ICD-10-CM | POA: Diagnosis not present

## 2018-06-22 DIAGNOSIS — Z9181 History of falling: Secondary | ICD-10-CM | POA: Diagnosis not present

## 2018-06-22 DIAGNOSIS — I5030 Unspecified diastolic (congestive) heart failure: Secondary | ICD-10-CM | POA: Diagnosis not present

## 2018-06-23 DIAGNOSIS — R2689 Other abnormalities of gait and mobility: Secondary | ICD-10-CM | POA: Diagnosis not present

## 2018-06-23 DIAGNOSIS — M6281 Muscle weakness (generalized): Secondary | ICD-10-CM | POA: Diagnosis not present

## 2018-06-23 DIAGNOSIS — R296 Repeated falls: Secondary | ICD-10-CM | POA: Diagnosis not present

## 2018-06-23 DIAGNOSIS — Z9181 History of falling: Secondary | ICD-10-CM | POA: Diagnosis not present

## 2018-06-23 DIAGNOSIS — R278 Other lack of coordination: Secondary | ICD-10-CM | POA: Diagnosis not present

## 2018-06-24 DIAGNOSIS — J189 Pneumonia, unspecified organism: Secondary | ICD-10-CM | POA: Diagnosis not present

## 2018-06-24 DIAGNOSIS — M6281 Muscle weakness (generalized): Secondary | ICD-10-CM | POA: Diagnosis not present

## 2018-06-24 DIAGNOSIS — Z9181 History of falling: Secondary | ICD-10-CM | POA: Diagnosis not present

## 2018-06-24 DIAGNOSIS — R278 Other lack of coordination: Secondary | ICD-10-CM | POA: Diagnosis not present

## 2018-06-24 DIAGNOSIS — R296 Repeated falls: Secondary | ICD-10-CM | POA: Diagnosis not present

## 2018-06-24 DIAGNOSIS — I313 Pericardial effusion (noninflammatory): Secondary | ICD-10-CM | POA: Diagnosis not present

## 2018-06-24 DIAGNOSIS — J449 Chronic obstructive pulmonary disease, unspecified: Secondary | ICD-10-CM | POA: Diagnosis not present

## 2018-06-24 DIAGNOSIS — J918 Pleural effusion in other conditions classified elsewhere: Secondary | ICD-10-CM | POA: Diagnosis not present

## 2018-06-24 DIAGNOSIS — I5030 Unspecified diastolic (congestive) heart failure: Secondary | ICD-10-CM | POA: Diagnosis not present

## 2018-06-24 DIAGNOSIS — R2689 Other abnormalities of gait and mobility: Secondary | ICD-10-CM | POA: Diagnosis not present

## 2018-06-24 DIAGNOSIS — J9611 Chronic respiratory failure with hypoxia: Secondary | ICD-10-CM | POA: Diagnosis not present

## 2018-06-27 DIAGNOSIS — J9611 Chronic respiratory failure with hypoxia: Secondary | ICD-10-CM | POA: Diagnosis not present

## 2018-06-27 DIAGNOSIS — J189 Pneumonia, unspecified organism: Secondary | ICD-10-CM | POA: Diagnosis not present

## 2018-06-27 DIAGNOSIS — R52 Pain, unspecified: Secondary | ICD-10-CM | POA: Diagnosis not present

## 2018-06-27 DIAGNOSIS — J449 Chronic obstructive pulmonary disease, unspecified: Secondary | ICD-10-CM | POA: Diagnosis not present

## 2018-06-27 DIAGNOSIS — I5030 Unspecified diastolic (congestive) heart failure: Secondary | ICD-10-CM | POA: Diagnosis not present

## 2018-06-27 DIAGNOSIS — R2689 Other abnormalities of gait and mobility: Secondary | ICD-10-CM | POA: Diagnosis not present

## 2018-06-27 DIAGNOSIS — I313 Pericardial effusion (noninflammatory): Secondary | ICD-10-CM | POA: Diagnosis not present

## 2018-06-27 DIAGNOSIS — Z79899 Other long term (current) drug therapy: Secondary | ICD-10-CM | POA: Diagnosis not present

## 2018-06-27 DIAGNOSIS — F331 Major depressive disorder, recurrent, moderate: Secondary | ICD-10-CM | POA: Diagnosis not present

## 2018-06-27 DIAGNOSIS — R0602 Shortness of breath: Secondary | ICD-10-CM | POA: Diagnosis not present

## 2018-06-27 DIAGNOSIS — R296 Repeated falls: Secondary | ICD-10-CM | POA: Diagnosis not present

## 2018-06-27 DIAGNOSIS — M6281 Muscle weakness (generalized): Secondary | ICD-10-CM | POA: Diagnosis not present

## 2018-06-27 DIAGNOSIS — Z9181 History of falling: Secondary | ICD-10-CM | POA: Diagnosis not present

## 2018-06-27 DIAGNOSIS — R278 Other lack of coordination: Secondary | ICD-10-CM | POA: Diagnosis not present

## 2018-06-27 DIAGNOSIS — J918 Pleural effusion in other conditions classified elsewhere: Secondary | ICD-10-CM | POA: Diagnosis not present

## 2018-06-28 DIAGNOSIS — J9611 Chronic respiratory failure with hypoxia: Secondary | ICD-10-CM | POA: Diagnosis not present

## 2018-06-28 DIAGNOSIS — Z9181 History of falling: Secondary | ICD-10-CM | POA: Diagnosis not present

## 2018-06-28 DIAGNOSIS — J449 Chronic obstructive pulmonary disease, unspecified: Secondary | ICD-10-CM | POA: Diagnosis not present

## 2018-06-28 DIAGNOSIS — R296 Repeated falls: Secondary | ICD-10-CM | POA: Diagnosis not present

## 2018-06-28 DIAGNOSIS — R2689 Other abnormalities of gait and mobility: Secondary | ICD-10-CM | POA: Diagnosis not present

## 2018-06-28 DIAGNOSIS — R278 Other lack of coordination: Secondary | ICD-10-CM | POA: Diagnosis not present

## 2018-06-28 DIAGNOSIS — I5030 Unspecified diastolic (congestive) heart failure: Secondary | ICD-10-CM | POA: Diagnosis not present

## 2018-06-28 DIAGNOSIS — J918 Pleural effusion in other conditions classified elsewhere: Secondary | ICD-10-CM | POA: Diagnosis not present

## 2018-06-28 DIAGNOSIS — I313 Pericardial effusion (noninflammatory): Secondary | ICD-10-CM | POA: Diagnosis not present

## 2018-06-28 DIAGNOSIS — M6281 Muscle weakness (generalized): Secondary | ICD-10-CM | POA: Diagnosis not present

## 2018-06-28 DIAGNOSIS — J189 Pneumonia, unspecified organism: Secondary | ICD-10-CM | POA: Diagnosis not present

## 2018-06-29 DIAGNOSIS — Z9181 History of falling: Secondary | ICD-10-CM | POA: Diagnosis not present

## 2018-06-29 DIAGNOSIS — R2689 Other abnormalities of gait and mobility: Secondary | ICD-10-CM | POA: Diagnosis not present

## 2018-06-29 DIAGNOSIS — R278 Other lack of coordination: Secondary | ICD-10-CM | POA: Diagnosis not present

## 2018-06-29 DIAGNOSIS — M6281 Muscle weakness (generalized): Secondary | ICD-10-CM | POA: Diagnosis not present

## 2018-06-29 DIAGNOSIS — R296 Repeated falls: Secondary | ICD-10-CM | POA: Diagnosis not present

## 2018-06-30 DIAGNOSIS — R296 Repeated falls: Secondary | ICD-10-CM | POA: Diagnosis not present

## 2018-06-30 DIAGNOSIS — J189 Pneumonia, unspecified organism: Secondary | ICD-10-CM | POA: Diagnosis not present

## 2018-06-30 DIAGNOSIS — J449 Chronic obstructive pulmonary disease, unspecified: Secondary | ICD-10-CM | POA: Diagnosis not present

## 2018-06-30 DIAGNOSIS — I5030 Unspecified diastolic (congestive) heart failure: Secondary | ICD-10-CM | POA: Diagnosis not present

## 2018-06-30 DIAGNOSIS — J918 Pleural effusion in other conditions classified elsewhere: Secondary | ICD-10-CM | POA: Diagnosis not present

## 2018-06-30 DIAGNOSIS — R2689 Other abnormalities of gait and mobility: Secondary | ICD-10-CM | POA: Diagnosis not present

## 2018-06-30 DIAGNOSIS — J9611 Chronic respiratory failure with hypoxia: Secondary | ICD-10-CM | POA: Diagnosis not present

## 2018-06-30 DIAGNOSIS — I313 Pericardial effusion (noninflammatory): Secondary | ICD-10-CM | POA: Diagnosis not present

## 2018-06-30 DIAGNOSIS — R278 Other lack of coordination: Secondary | ICD-10-CM | POA: Diagnosis not present

## 2018-06-30 DIAGNOSIS — Z9181 History of falling: Secondary | ICD-10-CM | POA: Diagnosis not present

## 2018-06-30 DIAGNOSIS — M6281 Muscle weakness (generalized): Secondary | ICD-10-CM | POA: Diagnosis not present

## 2018-07-01 DIAGNOSIS — M6281 Muscle weakness (generalized): Secondary | ICD-10-CM | POA: Diagnosis not present

## 2018-07-01 DIAGNOSIS — R296 Repeated falls: Secondary | ICD-10-CM | POA: Diagnosis not present

## 2018-07-01 DIAGNOSIS — R2689 Other abnormalities of gait and mobility: Secondary | ICD-10-CM | POA: Diagnosis not present

## 2018-07-01 DIAGNOSIS — Z9181 History of falling: Secondary | ICD-10-CM | POA: Diagnosis not present

## 2018-07-01 DIAGNOSIS — R278 Other lack of coordination: Secondary | ICD-10-CM | POA: Diagnosis not present

## 2018-07-02 DIAGNOSIS — F0390 Unspecified dementia without behavioral disturbance: Secondary | ICD-10-CM | POA: Diagnosis not present

## 2018-07-02 DIAGNOSIS — Z86006 Personal history of melanoma in-situ: Secondary | ICD-10-CM | POA: Diagnosis not present

## 2018-07-02 DIAGNOSIS — R296 Repeated falls: Secondary | ICD-10-CM | POA: Diagnosis not present

## 2018-07-02 DIAGNOSIS — E039 Hypothyroidism, unspecified: Secondary | ICD-10-CM | POA: Diagnosis not present

## 2018-07-02 DIAGNOSIS — I10 Essential (primary) hypertension: Secondary | ICD-10-CM | POA: Diagnosis not present

## 2018-07-02 DIAGNOSIS — J449 Chronic obstructive pulmonary disease, unspecified: Secondary | ICD-10-CM | POA: Diagnosis not present

## 2018-07-02 DIAGNOSIS — J9611 Chronic respiratory failure with hypoxia: Secondary | ICD-10-CM | POA: Diagnosis not present

## 2018-07-02 DIAGNOSIS — E785 Hyperlipidemia, unspecified: Secondary | ICD-10-CM | POA: Diagnosis not present

## 2018-07-02 DIAGNOSIS — I25119 Atherosclerotic heart disease of native coronary artery with unspecified angina pectoris: Secondary | ICD-10-CM | POA: Diagnosis not present

## 2018-07-02 DIAGNOSIS — J918 Pleural effusion in other conditions classified elsewhere: Secondary | ICD-10-CM | POA: Diagnosis not present

## 2018-07-02 DIAGNOSIS — I5030 Unspecified diastolic (congestive) heart failure: Secondary | ICD-10-CM | POA: Diagnosis not present

## 2018-07-02 DIAGNOSIS — I313 Pericardial effusion (noninflammatory): Secondary | ICD-10-CM | POA: Diagnosis not present

## 2018-07-02 DIAGNOSIS — J189 Pneumonia, unspecified organism: Secondary | ICD-10-CM | POA: Diagnosis not present

## 2018-07-04 DIAGNOSIS — J449 Chronic obstructive pulmonary disease, unspecified: Secondary | ICD-10-CM | POA: Diagnosis not present

## 2018-07-04 DIAGNOSIS — I313 Pericardial effusion (noninflammatory): Secondary | ICD-10-CM | POA: Diagnosis not present

## 2018-07-04 DIAGNOSIS — M6281 Muscle weakness (generalized): Secondary | ICD-10-CM | POA: Diagnosis not present

## 2018-07-04 DIAGNOSIS — J918 Pleural effusion in other conditions classified elsewhere: Secondary | ICD-10-CM | POA: Diagnosis not present

## 2018-07-04 DIAGNOSIS — R2689 Other abnormalities of gait and mobility: Secondary | ICD-10-CM | POA: Diagnosis not present

## 2018-07-04 DIAGNOSIS — R296 Repeated falls: Secondary | ICD-10-CM | POA: Diagnosis not present

## 2018-07-04 DIAGNOSIS — Z9181 History of falling: Secondary | ICD-10-CM | POA: Diagnosis not present

## 2018-07-04 DIAGNOSIS — J189 Pneumonia, unspecified organism: Secondary | ICD-10-CM | POA: Diagnosis not present

## 2018-07-04 DIAGNOSIS — R278 Other lack of coordination: Secondary | ICD-10-CM | POA: Diagnosis not present

## 2018-07-04 DIAGNOSIS — I5030 Unspecified diastolic (congestive) heart failure: Secondary | ICD-10-CM | POA: Diagnosis not present

## 2018-07-04 DIAGNOSIS — J9611 Chronic respiratory failure with hypoxia: Secondary | ICD-10-CM | POA: Diagnosis not present

## 2018-07-05 DIAGNOSIS — Z9181 History of falling: Secondary | ICD-10-CM | POA: Diagnosis not present

## 2018-07-05 DIAGNOSIS — I5030 Unspecified diastolic (congestive) heart failure: Secondary | ICD-10-CM | POA: Diagnosis not present

## 2018-07-05 DIAGNOSIS — M6281 Muscle weakness (generalized): Secondary | ICD-10-CM | POA: Diagnosis not present

## 2018-07-05 DIAGNOSIS — R2689 Other abnormalities of gait and mobility: Secondary | ICD-10-CM | POA: Diagnosis not present

## 2018-07-05 DIAGNOSIS — J449 Chronic obstructive pulmonary disease, unspecified: Secondary | ICD-10-CM | POA: Diagnosis not present

## 2018-07-05 DIAGNOSIS — J9611 Chronic respiratory failure with hypoxia: Secondary | ICD-10-CM | POA: Diagnosis not present

## 2018-07-05 DIAGNOSIS — I313 Pericardial effusion (noninflammatory): Secondary | ICD-10-CM | POA: Diagnosis not present

## 2018-07-05 DIAGNOSIS — J189 Pneumonia, unspecified organism: Secondary | ICD-10-CM | POA: Diagnosis not present

## 2018-07-05 DIAGNOSIS — J918 Pleural effusion in other conditions classified elsewhere: Secondary | ICD-10-CM | POA: Diagnosis not present

## 2018-07-05 DIAGNOSIS — R296 Repeated falls: Secondary | ICD-10-CM | POA: Diagnosis not present

## 2018-07-05 DIAGNOSIS — R278 Other lack of coordination: Secondary | ICD-10-CM | POA: Diagnosis not present

## 2018-07-06 DIAGNOSIS — Z9181 History of falling: Secondary | ICD-10-CM | POA: Diagnosis not present

## 2018-07-06 DIAGNOSIS — R2689 Other abnormalities of gait and mobility: Secondary | ICD-10-CM | POA: Diagnosis not present

## 2018-07-06 DIAGNOSIS — R296 Repeated falls: Secondary | ICD-10-CM | POA: Diagnosis not present

## 2018-07-06 DIAGNOSIS — M6281 Muscle weakness (generalized): Secondary | ICD-10-CM | POA: Diagnosis not present

## 2018-07-06 DIAGNOSIS — R278 Other lack of coordination: Secondary | ICD-10-CM | POA: Diagnosis not present

## 2018-07-07 DIAGNOSIS — J9611 Chronic respiratory failure with hypoxia: Secondary | ICD-10-CM | POA: Diagnosis not present

## 2018-07-07 DIAGNOSIS — I313 Pericardial effusion (noninflammatory): Secondary | ICD-10-CM | POA: Diagnosis not present

## 2018-07-07 DIAGNOSIS — J918 Pleural effusion in other conditions classified elsewhere: Secondary | ICD-10-CM | POA: Diagnosis not present

## 2018-07-07 DIAGNOSIS — Z9181 History of falling: Secondary | ICD-10-CM | POA: Diagnosis not present

## 2018-07-07 DIAGNOSIS — R2689 Other abnormalities of gait and mobility: Secondary | ICD-10-CM | POA: Diagnosis not present

## 2018-07-07 DIAGNOSIS — J449 Chronic obstructive pulmonary disease, unspecified: Secondary | ICD-10-CM | POA: Diagnosis not present

## 2018-07-07 DIAGNOSIS — R296 Repeated falls: Secondary | ICD-10-CM | POA: Diagnosis not present

## 2018-07-07 DIAGNOSIS — M6281 Muscle weakness (generalized): Secondary | ICD-10-CM | POA: Diagnosis not present

## 2018-07-07 DIAGNOSIS — R278 Other lack of coordination: Secondary | ICD-10-CM | POA: Diagnosis not present

## 2018-07-07 DIAGNOSIS — J189 Pneumonia, unspecified organism: Secondary | ICD-10-CM | POA: Diagnosis not present

## 2018-07-07 DIAGNOSIS — I5030 Unspecified diastolic (congestive) heart failure: Secondary | ICD-10-CM | POA: Diagnosis not present

## 2018-07-08 DIAGNOSIS — J9611 Chronic respiratory failure with hypoxia: Secondary | ICD-10-CM | POA: Diagnosis not present

## 2018-07-08 DIAGNOSIS — M6281 Muscle weakness (generalized): Secondary | ICD-10-CM | POA: Diagnosis not present

## 2018-07-08 DIAGNOSIS — J449 Chronic obstructive pulmonary disease, unspecified: Secondary | ICD-10-CM | POA: Diagnosis not present

## 2018-07-08 DIAGNOSIS — R2689 Other abnormalities of gait and mobility: Secondary | ICD-10-CM | POA: Diagnosis not present

## 2018-07-08 DIAGNOSIS — I313 Pericardial effusion (noninflammatory): Secondary | ICD-10-CM | POA: Diagnosis not present

## 2018-07-08 DIAGNOSIS — R278 Other lack of coordination: Secondary | ICD-10-CM | POA: Diagnosis not present

## 2018-07-08 DIAGNOSIS — I5030 Unspecified diastolic (congestive) heart failure: Secondary | ICD-10-CM | POA: Diagnosis not present

## 2018-07-08 DIAGNOSIS — Z9181 History of falling: Secondary | ICD-10-CM | POA: Diagnosis not present

## 2018-07-08 DIAGNOSIS — R296 Repeated falls: Secondary | ICD-10-CM | POA: Diagnosis not present

## 2018-07-08 DIAGNOSIS — J189 Pneumonia, unspecified organism: Secondary | ICD-10-CM | POA: Diagnosis not present

## 2018-07-08 DIAGNOSIS — J918 Pleural effusion in other conditions classified elsewhere: Secondary | ICD-10-CM | POA: Diagnosis not present

## 2018-07-12 DIAGNOSIS — I313 Pericardial effusion (noninflammatory): Secondary | ICD-10-CM | POA: Diagnosis not present

## 2018-07-12 DIAGNOSIS — J449 Chronic obstructive pulmonary disease, unspecified: Secondary | ICD-10-CM | POA: Diagnosis not present

## 2018-07-12 DIAGNOSIS — M6281 Muscle weakness (generalized): Secondary | ICD-10-CM | POA: Diagnosis not present

## 2018-07-12 DIAGNOSIS — R2689 Other abnormalities of gait and mobility: Secondary | ICD-10-CM | POA: Diagnosis not present

## 2018-07-12 DIAGNOSIS — J918 Pleural effusion in other conditions classified elsewhere: Secondary | ICD-10-CM | POA: Diagnosis not present

## 2018-07-12 DIAGNOSIS — R278 Other lack of coordination: Secondary | ICD-10-CM | POA: Diagnosis not present

## 2018-07-12 DIAGNOSIS — I5030 Unspecified diastolic (congestive) heart failure: Secondary | ICD-10-CM | POA: Diagnosis not present

## 2018-07-12 DIAGNOSIS — J9611 Chronic respiratory failure with hypoxia: Secondary | ICD-10-CM | POA: Diagnosis not present

## 2018-07-12 DIAGNOSIS — J189 Pneumonia, unspecified organism: Secondary | ICD-10-CM | POA: Diagnosis not present

## 2018-07-12 DIAGNOSIS — R296 Repeated falls: Secondary | ICD-10-CM | POA: Diagnosis not present

## 2018-07-12 DIAGNOSIS — Z9181 History of falling: Secondary | ICD-10-CM | POA: Diagnosis not present

## 2018-07-13 DIAGNOSIS — Z9181 History of falling: Secondary | ICD-10-CM | POA: Diagnosis not present

## 2018-07-13 DIAGNOSIS — R2689 Other abnormalities of gait and mobility: Secondary | ICD-10-CM | POA: Diagnosis not present

## 2018-07-13 DIAGNOSIS — R296 Repeated falls: Secondary | ICD-10-CM | POA: Diagnosis not present

## 2018-07-13 DIAGNOSIS — M6281 Muscle weakness (generalized): Secondary | ICD-10-CM | POA: Diagnosis not present

## 2018-07-13 DIAGNOSIS — R278 Other lack of coordination: Secondary | ICD-10-CM | POA: Diagnosis not present

## 2018-07-14 DIAGNOSIS — I313 Pericardial effusion (noninflammatory): Secondary | ICD-10-CM | POA: Diagnosis not present

## 2018-07-14 DIAGNOSIS — Z9181 History of falling: Secondary | ICD-10-CM | POA: Diagnosis not present

## 2018-07-14 DIAGNOSIS — R278 Other lack of coordination: Secondary | ICD-10-CM | POA: Diagnosis not present

## 2018-07-14 DIAGNOSIS — J189 Pneumonia, unspecified organism: Secondary | ICD-10-CM | POA: Diagnosis not present

## 2018-07-14 DIAGNOSIS — J9611 Chronic respiratory failure with hypoxia: Secondary | ICD-10-CM | POA: Diagnosis not present

## 2018-07-14 DIAGNOSIS — R296 Repeated falls: Secondary | ICD-10-CM | POA: Diagnosis not present

## 2018-07-14 DIAGNOSIS — R2689 Other abnormalities of gait and mobility: Secondary | ICD-10-CM | POA: Diagnosis not present

## 2018-07-14 DIAGNOSIS — I5030 Unspecified diastolic (congestive) heart failure: Secondary | ICD-10-CM | POA: Diagnosis not present

## 2018-07-14 DIAGNOSIS — J918 Pleural effusion in other conditions classified elsewhere: Secondary | ICD-10-CM | POA: Diagnosis not present

## 2018-07-14 DIAGNOSIS — M6281 Muscle weakness (generalized): Secondary | ICD-10-CM | POA: Diagnosis not present

## 2018-07-14 DIAGNOSIS — J449 Chronic obstructive pulmonary disease, unspecified: Secondary | ICD-10-CM | POA: Diagnosis not present

## 2018-07-15 DIAGNOSIS — R2689 Other abnormalities of gait and mobility: Secondary | ICD-10-CM | POA: Diagnosis not present

## 2018-07-15 DIAGNOSIS — J189 Pneumonia, unspecified organism: Secondary | ICD-10-CM | POA: Diagnosis not present

## 2018-07-15 DIAGNOSIS — R278 Other lack of coordination: Secondary | ICD-10-CM | POA: Diagnosis not present

## 2018-07-15 DIAGNOSIS — J918 Pleural effusion in other conditions classified elsewhere: Secondary | ICD-10-CM | POA: Diagnosis not present

## 2018-07-15 DIAGNOSIS — J9611 Chronic respiratory failure with hypoxia: Secondary | ICD-10-CM | POA: Diagnosis not present

## 2018-07-15 DIAGNOSIS — R296 Repeated falls: Secondary | ICD-10-CM | POA: Diagnosis not present

## 2018-07-15 DIAGNOSIS — M6281 Muscle weakness (generalized): Secondary | ICD-10-CM | POA: Diagnosis not present

## 2018-07-15 DIAGNOSIS — Z9181 History of falling: Secondary | ICD-10-CM | POA: Diagnosis not present

## 2018-07-15 DIAGNOSIS — I313 Pericardial effusion (noninflammatory): Secondary | ICD-10-CM | POA: Diagnosis not present

## 2018-07-15 DIAGNOSIS — I5030 Unspecified diastolic (congestive) heart failure: Secondary | ICD-10-CM | POA: Diagnosis not present

## 2018-07-15 DIAGNOSIS — J449 Chronic obstructive pulmonary disease, unspecified: Secondary | ICD-10-CM | POA: Diagnosis not present

## 2018-07-16 DIAGNOSIS — J449 Chronic obstructive pulmonary disease, unspecified: Secondary | ICD-10-CM | POA: Diagnosis not present

## 2018-07-16 DIAGNOSIS — I509 Heart failure, unspecified: Secondary | ICD-10-CM | POA: Diagnosis not present

## 2018-07-16 DIAGNOSIS — E039 Hypothyroidism, unspecified: Secondary | ICD-10-CM | POA: Diagnosis not present

## 2018-07-18 DIAGNOSIS — Z9181 History of falling: Secondary | ICD-10-CM | POA: Diagnosis not present

## 2018-07-18 DIAGNOSIS — M6281 Muscle weakness (generalized): Secondary | ICD-10-CM | POA: Diagnosis not present

## 2018-07-18 DIAGNOSIS — R296 Repeated falls: Secondary | ICD-10-CM | POA: Diagnosis not present

## 2018-07-18 DIAGNOSIS — R278 Other lack of coordination: Secondary | ICD-10-CM | POA: Diagnosis not present

## 2018-07-18 DIAGNOSIS — R2689 Other abnormalities of gait and mobility: Secondary | ICD-10-CM | POA: Diagnosis not present

## 2018-07-19 DIAGNOSIS — R2689 Other abnormalities of gait and mobility: Secondary | ICD-10-CM | POA: Diagnosis not present

## 2018-07-19 DIAGNOSIS — R278 Other lack of coordination: Secondary | ICD-10-CM | POA: Diagnosis not present

## 2018-07-19 DIAGNOSIS — I5030 Unspecified diastolic (congestive) heart failure: Secondary | ICD-10-CM | POA: Diagnosis not present

## 2018-07-19 DIAGNOSIS — I313 Pericardial effusion (noninflammatory): Secondary | ICD-10-CM | POA: Diagnosis not present

## 2018-07-19 DIAGNOSIS — M6281 Muscle weakness (generalized): Secondary | ICD-10-CM | POA: Diagnosis not present

## 2018-07-19 DIAGNOSIS — R296 Repeated falls: Secondary | ICD-10-CM | POA: Diagnosis not present

## 2018-07-19 DIAGNOSIS — J189 Pneumonia, unspecified organism: Secondary | ICD-10-CM | POA: Diagnosis not present

## 2018-07-19 DIAGNOSIS — J449 Chronic obstructive pulmonary disease, unspecified: Secondary | ICD-10-CM | POA: Diagnosis not present

## 2018-07-19 DIAGNOSIS — J9611 Chronic respiratory failure with hypoxia: Secondary | ICD-10-CM | POA: Diagnosis not present

## 2018-07-19 DIAGNOSIS — Z9181 History of falling: Secondary | ICD-10-CM | POA: Diagnosis not present

## 2018-07-19 DIAGNOSIS — J918 Pleural effusion in other conditions classified elsewhere: Secondary | ICD-10-CM | POA: Diagnosis not present

## 2018-07-20 DIAGNOSIS — M6281 Muscle weakness (generalized): Secondary | ICD-10-CM | POA: Diagnosis not present

## 2018-07-20 DIAGNOSIS — R2689 Other abnormalities of gait and mobility: Secondary | ICD-10-CM | POA: Diagnosis not present

## 2018-07-20 DIAGNOSIS — Z9181 History of falling: Secondary | ICD-10-CM | POA: Diagnosis not present

## 2018-07-20 DIAGNOSIS — R296 Repeated falls: Secondary | ICD-10-CM | POA: Diagnosis not present

## 2018-07-20 DIAGNOSIS — R278 Other lack of coordination: Secondary | ICD-10-CM | POA: Diagnosis not present

## 2018-07-21 DIAGNOSIS — J189 Pneumonia, unspecified organism: Secondary | ICD-10-CM | POA: Diagnosis not present

## 2018-07-21 DIAGNOSIS — R2689 Other abnormalities of gait and mobility: Secondary | ICD-10-CM | POA: Diagnosis not present

## 2018-07-21 DIAGNOSIS — R278 Other lack of coordination: Secondary | ICD-10-CM | POA: Diagnosis not present

## 2018-07-21 DIAGNOSIS — I313 Pericardial effusion (noninflammatory): Secondary | ICD-10-CM | POA: Diagnosis not present

## 2018-07-21 DIAGNOSIS — R296 Repeated falls: Secondary | ICD-10-CM | POA: Diagnosis not present

## 2018-07-21 DIAGNOSIS — J449 Chronic obstructive pulmonary disease, unspecified: Secondary | ICD-10-CM | POA: Diagnosis not present

## 2018-07-21 DIAGNOSIS — Z9181 History of falling: Secondary | ICD-10-CM | POA: Diagnosis not present

## 2018-07-21 DIAGNOSIS — I5030 Unspecified diastolic (congestive) heart failure: Secondary | ICD-10-CM | POA: Diagnosis not present

## 2018-07-21 DIAGNOSIS — J9611 Chronic respiratory failure with hypoxia: Secondary | ICD-10-CM | POA: Diagnosis not present

## 2018-07-21 DIAGNOSIS — M6281 Muscle weakness (generalized): Secondary | ICD-10-CM | POA: Diagnosis not present

## 2018-07-21 DIAGNOSIS — J918 Pleural effusion in other conditions classified elsewhere: Secondary | ICD-10-CM | POA: Diagnosis not present

## 2018-07-22 DIAGNOSIS — M6281 Muscle weakness (generalized): Secondary | ICD-10-CM | POA: Diagnosis not present

## 2018-07-22 DIAGNOSIS — R278 Other lack of coordination: Secondary | ICD-10-CM | POA: Diagnosis not present

## 2018-07-22 DIAGNOSIS — R296 Repeated falls: Secondary | ICD-10-CM | POA: Diagnosis not present

## 2018-07-22 DIAGNOSIS — Z9181 History of falling: Secondary | ICD-10-CM | POA: Diagnosis not present

## 2018-07-22 DIAGNOSIS — R2689 Other abnormalities of gait and mobility: Secondary | ICD-10-CM | POA: Diagnosis not present

## 2018-07-25 DIAGNOSIS — M6281 Muscle weakness (generalized): Secondary | ICD-10-CM | POA: Diagnosis not present

## 2018-07-25 DIAGNOSIS — K5901 Slow transit constipation: Secondary | ICD-10-CM | POA: Diagnosis not present

## 2018-07-25 DIAGNOSIS — R600 Localized edema: Secondary | ICD-10-CM | POA: Diagnosis not present

## 2018-07-25 DIAGNOSIS — Z9181 History of falling: Secondary | ICD-10-CM | POA: Diagnosis not present

## 2018-07-25 DIAGNOSIS — J9611 Chronic respiratory failure with hypoxia: Secondary | ICD-10-CM | POA: Diagnosis not present

## 2018-07-25 DIAGNOSIS — I5030 Unspecified diastolic (congestive) heart failure: Secondary | ICD-10-CM | POA: Diagnosis not present

## 2018-07-25 DIAGNOSIS — J449 Chronic obstructive pulmonary disease, unspecified: Secondary | ICD-10-CM | POA: Diagnosis not present

## 2018-07-25 DIAGNOSIS — J189 Pneumonia, unspecified organism: Secondary | ICD-10-CM | POA: Diagnosis not present

## 2018-07-25 DIAGNOSIS — R2689 Other abnormalities of gait and mobility: Secondary | ICD-10-CM | POA: Diagnosis not present

## 2018-07-25 DIAGNOSIS — I313 Pericardial effusion (noninflammatory): Secondary | ICD-10-CM | POA: Diagnosis not present

## 2018-07-25 DIAGNOSIS — R296 Repeated falls: Secondary | ICD-10-CM | POA: Diagnosis not present

## 2018-07-25 DIAGNOSIS — R278 Other lack of coordination: Secondary | ICD-10-CM | POA: Diagnosis not present

## 2018-07-25 DIAGNOSIS — J918 Pleural effusion in other conditions classified elsewhere: Secondary | ICD-10-CM | POA: Diagnosis not present

## 2018-07-25 DIAGNOSIS — Z79899 Other long term (current) drug therapy: Secondary | ICD-10-CM | POA: Diagnosis not present

## 2018-07-25 DIAGNOSIS — F331 Major depressive disorder, recurrent, moderate: Secondary | ICD-10-CM | POA: Diagnosis not present

## 2018-07-26 DIAGNOSIS — I5030 Unspecified diastolic (congestive) heart failure: Secondary | ICD-10-CM | POA: Diagnosis not present

## 2018-07-26 DIAGNOSIS — M6281 Muscle weakness (generalized): Secondary | ICD-10-CM | POA: Diagnosis not present

## 2018-07-26 DIAGNOSIS — I313 Pericardial effusion (noninflammatory): Secondary | ICD-10-CM | POA: Diagnosis not present

## 2018-07-26 DIAGNOSIS — J9611 Chronic respiratory failure with hypoxia: Secondary | ICD-10-CM | POA: Diagnosis not present

## 2018-07-26 DIAGNOSIS — Z9181 History of falling: Secondary | ICD-10-CM | POA: Diagnosis not present

## 2018-07-26 DIAGNOSIS — J918 Pleural effusion in other conditions classified elsewhere: Secondary | ICD-10-CM | POA: Diagnosis not present

## 2018-07-26 DIAGNOSIS — J449 Chronic obstructive pulmonary disease, unspecified: Secondary | ICD-10-CM | POA: Diagnosis not present

## 2018-07-26 DIAGNOSIS — R296 Repeated falls: Secondary | ICD-10-CM | POA: Diagnosis not present

## 2018-07-26 DIAGNOSIS — J189 Pneumonia, unspecified organism: Secondary | ICD-10-CM | POA: Diagnosis not present

## 2018-07-26 DIAGNOSIS — R278 Other lack of coordination: Secondary | ICD-10-CM | POA: Diagnosis not present

## 2018-07-26 DIAGNOSIS — R2689 Other abnormalities of gait and mobility: Secondary | ICD-10-CM | POA: Diagnosis not present

## 2018-07-27 DIAGNOSIS — R296 Repeated falls: Secondary | ICD-10-CM | POA: Diagnosis not present

## 2018-07-27 DIAGNOSIS — J9611 Chronic respiratory failure with hypoxia: Secondary | ICD-10-CM | POA: Diagnosis not present

## 2018-07-27 DIAGNOSIS — J189 Pneumonia, unspecified organism: Secondary | ICD-10-CM | POA: Diagnosis not present

## 2018-07-27 DIAGNOSIS — I5030 Unspecified diastolic (congestive) heart failure: Secondary | ICD-10-CM | POA: Diagnosis not present

## 2018-07-27 DIAGNOSIS — J449 Chronic obstructive pulmonary disease, unspecified: Secondary | ICD-10-CM | POA: Diagnosis not present

## 2018-07-27 DIAGNOSIS — M6281 Muscle weakness (generalized): Secondary | ICD-10-CM | POA: Diagnosis not present

## 2018-07-27 DIAGNOSIS — Z9181 History of falling: Secondary | ICD-10-CM | POA: Diagnosis not present

## 2018-07-27 DIAGNOSIS — I313 Pericardial effusion (noninflammatory): Secondary | ICD-10-CM | POA: Diagnosis not present

## 2018-07-27 DIAGNOSIS — R2689 Other abnormalities of gait and mobility: Secondary | ICD-10-CM | POA: Diagnosis not present

## 2018-07-27 DIAGNOSIS — R278 Other lack of coordination: Secondary | ICD-10-CM | POA: Diagnosis not present

## 2018-07-27 DIAGNOSIS — J918 Pleural effusion in other conditions classified elsewhere: Secondary | ICD-10-CM | POA: Diagnosis not present

## 2018-07-28 DIAGNOSIS — M6281 Muscle weakness (generalized): Secondary | ICD-10-CM | POA: Diagnosis not present

## 2018-07-28 DIAGNOSIS — R296 Repeated falls: Secondary | ICD-10-CM | POA: Diagnosis not present

## 2018-07-28 DIAGNOSIS — R278 Other lack of coordination: Secondary | ICD-10-CM | POA: Diagnosis not present

## 2018-07-28 DIAGNOSIS — Z9181 History of falling: Secondary | ICD-10-CM | POA: Diagnosis not present

## 2018-07-28 DIAGNOSIS — R2689 Other abnormalities of gait and mobility: Secondary | ICD-10-CM | POA: Diagnosis not present

## 2018-07-29 DIAGNOSIS — R296 Repeated falls: Secondary | ICD-10-CM | POA: Diagnosis not present

## 2018-07-29 DIAGNOSIS — R2689 Other abnormalities of gait and mobility: Secondary | ICD-10-CM | POA: Diagnosis not present

## 2018-07-29 DIAGNOSIS — M6281 Muscle weakness (generalized): Secondary | ICD-10-CM | POA: Diagnosis not present

## 2018-07-29 DIAGNOSIS — Z9181 History of falling: Secondary | ICD-10-CM | POA: Diagnosis not present

## 2018-07-29 DIAGNOSIS — R278 Other lack of coordination: Secondary | ICD-10-CM | POA: Diagnosis not present

## 2018-07-31 DIAGNOSIS — Z86006 Personal history of melanoma in-situ: Secondary | ICD-10-CM | POA: Diagnosis not present

## 2018-07-31 DIAGNOSIS — J449 Chronic obstructive pulmonary disease, unspecified: Secondary | ICD-10-CM | POA: Diagnosis not present

## 2018-07-31 DIAGNOSIS — E785 Hyperlipidemia, unspecified: Secondary | ICD-10-CM | POA: Diagnosis not present

## 2018-07-31 DIAGNOSIS — I313 Pericardial effusion (noninflammatory): Secondary | ICD-10-CM | POA: Diagnosis not present

## 2018-07-31 DIAGNOSIS — J918 Pleural effusion in other conditions classified elsewhere: Secondary | ICD-10-CM | POA: Diagnosis not present

## 2018-07-31 DIAGNOSIS — I5030 Unspecified diastolic (congestive) heart failure: Secondary | ICD-10-CM | POA: Diagnosis not present

## 2018-07-31 DIAGNOSIS — E039 Hypothyroidism, unspecified: Secondary | ICD-10-CM | POA: Diagnosis not present

## 2018-07-31 DIAGNOSIS — J9611 Chronic respiratory failure with hypoxia: Secondary | ICD-10-CM | POA: Diagnosis not present

## 2018-07-31 DIAGNOSIS — F0390 Unspecified dementia without behavioral disturbance: Secondary | ICD-10-CM | POA: Diagnosis not present

## 2018-07-31 DIAGNOSIS — I25119 Atherosclerotic heart disease of native coronary artery with unspecified angina pectoris: Secondary | ICD-10-CM | POA: Diagnosis not present

## 2018-07-31 DIAGNOSIS — R296 Repeated falls: Secondary | ICD-10-CM | POA: Diagnosis not present

## 2018-07-31 DIAGNOSIS — I10 Essential (primary) hypertension: Secondary | ICD-10-CM | POA: Diagnosis not present

## 2018-07-31 DIAGNOSIS — J189 Pneumonia, unspecified organism: Secondary | ICD-10-CM | POA: Diagnosis not present

## 2018-08-01 DIAGNOSIS — J449 Chronic obstructive pulmonary disease, unspecified: Secondary | ICD-10-CM | POA: Diagnosis not present

## 2018-08-01 DIAGNOSIS — J9611 Chronic respiratory failure with hypoxia: Secondary | ICD-10-CM | POA: Diagnosis not present

## 2018-08-01 DIAGNOSIS — J918 Pleural effusion in other conditions classified elsewhere: Secondary | ICD-10-CM | POA: Diagnosis not present

## 2018-08-01 DIAGNOSIS — J189 Pneumonia, unspecified organism: Secondary | ICD-10-CM | POA: Diagnosis not present

## 2018-08-01 DIAGNOSIS — I313 Pericardial effusion (noninflammatory): Secondary | ICD-10-CM | POA: Diagnosis not present

## 2018-08-01 DIAGNOSIS — I5030 Unspecified diastolic (congestive) heart failure: Secondary | ICD-10-CM | POA: Diagnosis not present

## 2018-08-02 DIAGNOSIS — M6281 Muscle weakness (generalized): Secondary | ICD-10-CM | POA: Diagnosis not present

## 2018-08-02 DIAGNOSIS — Z9181 History of falling: Secondary | ICD-10-CM | POA: Diagnosis not present

## 2018-08-02 DIAGNOSIS — J918 Pleural effusion in other conditions classified elsewhere: Secondary | ICD-10-CM | POA: Diagnosis not present

## 2018-08-02 DIAGNOSIS — I313 Pericardial effusion (noninflammatory): Secondary | ICD-10-CM | POA: Diagnosis not present

## 2018-08-02 DIAGNOSIS — R278 Other lack of coordination: Secondary | ICD-10-CM | POA: Diagnosis not present

## 2018-08-02 DIAGNOSIS — J189 Pneumonia, unspecified organism: Secondary | ICD-10-CM | POA: Diagnosis not present

## 2018-08-02 DIAGNOSIS — R2689 Other abnormalities of gait and mobility: Secondary | ICD-10-CM | POA: Diagnosis not present

## 2018-08-02 DIAGNOSIS — R296 Repeated falls: Secondary | ICD-10-CM | POA: Diagnosis not present

## 2018-08-02 DIAGNOSIS — J449 Chronic obstructive pulmonary disease, unspecified: Secondary | ICD-10-CM | POA: Diagnosis not present

## 2018-08-02 DIAGNOSIS — J9611 Chronic respiratory failure with hypoxia: Secondary | ICD-10-CM | POA: Diagnosis not present

## 2018-08-02 DIAGNOSIS — I5030 Unspecified diastolic (congestive) heart failure: Secondary | ICD-10-CM | POA: Diagnosis not present

## 2018-08-03 DIAGNOSIS — R278 Other lack of coordination: Secondary | ICD-10-CM | POA: Diagnosis not present

## 2018-08-03 DIAGNOSIS — R2689 Other abnormalities of gait and mobility: Secondary | ICD-10-CM | POA: Diagnosis not present

## 2018-08-03 DIAGNOSIS — M6281 Muscle weakness (generalized): Secondary | ICD-10-CM | POA: Diagnosis not present

## 2018-08-03 DIAGNOSIS — Z9181 History of falling: Secondary | ICD-10-CM | POA: Diagnosis not present

## 2018-08-03 DIAGNOSIS — R296 Repeated falls: Secondary | ICD-10-CM | POA: Diagnosis not present

## 2018-08-04 DIAGNOSIS — J189 Pneumonia, unspecified organism: Secondary | ICD-10-CM | POA: Diagnosis not present

## 2018-08-04 DIAGNOSIS — R278 Other lack of coordination: Secondary | ICD-10-CM | POA: Diagnosis not present

## 2018-08-04 DIAGNOSIS — I313 Pericardial effusion (noninflammatory): Secondary | ICD-10-CM | POA: Diagnosis not present

## 2018-08-04 DIAGNOSIS — R2689 Other abnormalities of gait and mobility: Secondary | ICD-10-CM | POA: Diagnosis not present

## 2018-08-04 DIAGNOSIS — J449 Chronic obstructive pulmonary disease, unspecified: Secondary | ICD-10-CM | POA: Diagnosis not present

## 2018-08-04 DIAGNOSIS — Z9181 History of falling: Secondary | ICD-10-CM | POA: Diagnosis not present

## 2018-08-04 DIAGNOSIS — J918 Pleural effusion in other conditions classified elsewhere: Secondary | ICD-10-CM | POA: Diagnosis not present

## 2018-08-04 DIAGNOSIS — J9611 Chronic respiratory failure with hypoxia: Secondary | ICD-10-CM | POA: Diagnosis not present

## 2018-08-04 DIAGNOSIS — R296 Repeated falls: Secondary | ICD-10-CM | POA: Diagnosis not present

## 2018-08-04 DIAGNOSIS — I5030 Unspecified diastolic (congestive) heart failure: Secondary | ICD-10-CM | POA: Diagnosis not present

## 2018-08-04 DIAGNOSIS — M6281 Muscle weakness (generalized): Secondary | ICD-10-CM | POA: Diagnosis not present

## 2018-08-05 DIAGNOSIS — J189 Pneumonia, unspecified organism: Secondary | ICD-10-CM | POA: Diagnosis not present

## 2018-08-05 DIAGNOSIS — J918 Pleural effusion in other conditions classified elsewhere: Secondary | ICD-10-CM | POA: Diagnosis not present

## 2018-08-05 DIAGNOSIS — J449 Chronic obstructive pulmonary disease, unspecified: Secondary | ICD-10-CM | POA: Diagnosis not present

## 2018-08-05 DIAGNOSIS — I313 Pericardial effusion (noninflammatory): Secondary | ICD-10-CM | POA: Diagnosis not present

## 2018-08-05 DIAGNOSIS — I5030 Unspecified diastolic (congestive) heart failure: Secondary | ICD-10-CM | POA: Diagnosis not present

## 2018-08-05 DIAGNOSIS — J9611 Chronic respiratory failure with hypoxia: Secondary | ICD-10-CM | POA: Diagnosis not present

## 2018-08-08 DIAGNOSIS — J189 Pneumonia, unspecified organism: Secondary | ICD-10-CM | POA: Diagnosis not present

## 2018-08-08 DIAGNOSIS — M6281 Muscle weakness (generalized): Secondary | ICD-10-CM | POA: Diagnosis not present

## 2018-08-08 DIAGNOSIS — J918 Pleural effusion in other conditions classified elsewhere: Secondary | ICD-10-CM | POA: Diagnosis not present

## 2018-08-08 DIAGNOSIS — I313 Pericardial effusion (noninflammatory): Secondary | ICD-10-CM | POA: Diagnosis not present

## 2018-08-08 DIAGNOSIS — J9611 Chronic respiratory failure with hypoxia: Secondary | ICD-10-CM | POA: Diagnosis not present

## 2018-08-08 DIAGNOSIS — J449 Chronic obstructive pulmonary disease, unspecified: Secondary | ICD-10-CM | POA: Diagnosis not present

## 2018-08-08 DIAGNOSIS — R278 Other lack of coordination: Secondary | ICD-10-CM | POA: Diagnosis not present

## 2018-08-08 DIAGNOSIS — R296 Repeated falls: Secondary | ICD-10-CM | POA: Diagnosis not present

## 2018-08-08 DIAGNOSIS — Z9181 History of falling: Secondary | ICD-10-CM | POA: Diagnosis not present

## 2018-08-08 DIAGNOSIS — R2689 Other abnormalities of gait and mobility: Secondary | ICD-10-CM | POA: Diagnosis not present

## 2018-08-08 DIAGNOSIS — I5030 Unspecified diastolic (congestive) heart failure: Secondary | ICD-10-CM | POA: Diagnosis not present

## 2018-08-09 DIAGNOSIS — J918 Pleural effusion in other conditions classified elsewhere: Secondary | ICD-10-CM | POA: Diagnosis not present

## 2018-08-09 DIAGNOSIS — I5030 Unspecified diastolic (congestive) heart failure: Secondary | ICD-10-CM | POA: Diagnosis not present

## 2018-08-09 DIAGNOSIS — J449 Chronic obstructive pulmonary disease, unspecified: Secondary | ICD-10-CM | POA: Diagnosis not present

## 2018-08-09 DIAGNOSIS — I313 Pericardial effusion (noninflammatory): Secondary | ICD-10-CM | POA: Diagnosis not present

## 2018-08-09 DIAGNOSIS — J9611 Chronic respiratory failure with hypoxia: Secondary | ICD-10-CM | POA: Diagnosis not present

## 2018-08-09 DIAGNOSIS — J189 Pneumonia, unspecified organism: Secondary | ICD-10-CM | POA: Diagnosis not present

## 2018-08-11 DIAGNOSIS — J9611 Chronic respiratory failure with hypoxia: Secondary | ICD-10-CM | POA: Diagnosis not present

## 2018-08-11 DIAGNOSIS — I313 Pericardial effusion (noninflammatory): Secondary | ICD-10-CM | POA: Diagnosis not present

## 2018-08-11 DIAGNOSIS — J189 Pneumonia, unspecified organism: Secondary | ICD-10-CM | POA: Diagnosis not present

## 2018-08-11 DIAGNOSIS — I5030 Unspecified diastolic (congestive) heart failure: Secondary | ICD-10-CM | POA: Diagnosis not present

## 2018-08-11 DIAGNOSIS — J918 Pleural effusion in other conditions classified elsewhere: Secondary | ICD-10-CM | POA: Diagnosis not present

## 2018-08-11 DIAGNOSIS — J449 Chronic obstructive pulmonary disease, unspecified: Secondary | ICD-10-CM | POA: Diagnosis not present

## 2018-08-12 DIAGNOSIS — J449 Chronic obstructive pulmonary disease, unspecified: Secondary | ICD-10-CM | POA: Diagnosis not present

## 2018-08-12 DIAGNOSIS — J189 Pneumonia, unspecified organism: Secondary | ICD-10-CM | POA: Diagnosis not present

## 2018-08-12 DIAGNOSIS — J9611 Chronic respiratory failure with hypoxia: Secondary | ICD-10-CM | POA: Diagnosis not present

## 2018-08-12 DIAGNOSIS — I5030 Unspecified diastolic (congestive) heart failure: Secondary | ICD-10-CM | POA: Diagnosis not present

## 2018-08-12 DIAGNOSIS — J918 Pleural effusion in other conditions classified elsewhere: Secondary | ICD-10-CM | POA: Diagnosis not present

## 2018-08-12 DIAGNOSIS — I313 Pericardial effusion (noninflammatory): Secondary | ICD-10-CM | POA: Diagnosis not present

## 2018-08-16 DIAGNOSIS — R278 Other lack of coordination: Secondary | ICD-10-CM | POA: Diagnosis not present

## 2018-08-16 DIAGNOSIS — Z9181 History of falling: Secondary | ICD-10-CM | POA: Diagnosis not present

## 2018-08-16 DIAGNOSIS — J189 Pneumonia, unspecified organism: Secondary | ICD-10-CM | POA: Diagnosis not present

## 2018-08-16 DIAGNOSIS — J449 Chronic obstructive pulmonary disease, unspecified: Secondary | ICD-10-CM | POA: Diagnosis not present

## 2018-08-16 DIAGNOSIS — I313 Pericardial effusion (noninflammatory): Secondary | ICD-10-CM | POA: Diagnosis not present

## 2018-08-16 DIAGNOSIS — M6281 Muscle weakness (generalized): Secondary | ICD-10-CM | POA: Diagnosis not present

## 2018-08-16 DIAGNOSIS — J918 Pleural effusion in other conditions classified elsewhere: Secondary | ICD-10-CM | POA: Diagnosis not present

## 2018-08-16 DIAGNOSIS — I5030 Unspecified diastolic (congestive) heart failure: Secondary | ICD-10-CM | POA: Diagnosis not present

## 2018-08-16 DIAGNOSIS — R2689 Other abnormalities of gait and mobility: Secondary | ICD-10-CM | POA: Diagnosis not present

## 2018-08-16 DIAGNOSIS — R296 Repeated falls: Secondary | ICD-10-CM | POA: Diagnosis not present

## 2018-08-16 DIAGNOSIS — J9611 Chronic respiratory failure with hypoxia: Secondary | ICD-10-CM | POA: Diagnosis not present

## 2018-08-17 DIAGNOSIS — J449 Chronic obstructive pulmonary disease, unspecified: Secondary | ICD-10-CM | POA: Diagnosis not present

## 2018-08-17 DIAGNOSIS — J189 Pneumonia, unspecified organism: Secondary | ICD-10-CM | POA: Diagnosis not present

## 2018-08-17 DIAGNOSIS — I313 Pericardial effusion (noninflammatory): Secondary | ICD-10-CM | POA: Diagnosis not present

## 2018-08-17 DIAGNOSIS — J918 Pleural effusion in other conditions classified elsewhere: Secondary | ICD-10-CM | POA: Diagnosis not present

## 2018-08-17 DIAGNOSIS — J9611 Chronic respiratory failure with hypoxia: Secondary | ICD-10-CM | POA: Diagnosis not present

## 2018-08-17 DIAGNOSIS — I5030 Unspecified diastolic (congestive) heart failure: Secondary | ICD-10-CM | POA: Diagnosis not present

## 2018-08-18 DIAGNOSIS — I5030 Unspecified diastolic (congestive) heart failure: Secondary | ICD-10-CM | POA: Diagnosis not present

## 2018-08-18 DIAGNOSIS — J918 Pleural effusion in other conditions classified elsewhere: Secondary | ICD-10-CM | POA: Diagnosis not present

## 2018-08-18 DIAGNOSIS — R278 Other lack of coordination: Secondary | ICD-10-CM | POA: Diagnosis not present

## 2018-08-18 DIAGNOSIS — Z9181 History of falling: Secondary | ICD-10-CM | POA: Diagnosis not present

## 2018-08-18 DIAGNOSIS — J189 Pneumonia, unspecified organism: Secondary | ICD-10-CM | POA: Diagnosis not present

## 2018-08-18 DIAGNOSIS — I313 Pericardial effusion (noninflammatory): Secondary | ICD-10-CM | POA: Diagnosis not present

## 2018-08-18 DIAGNOSIS — J9611 Chronic respiratory failure with hypoxia: Secondary | ICD-10-CM | POA: Diagnosis not present

## 2018-08-18 DIAGNOSIS — R2689 Other abnormalities of gait and mobility: Secondary | ICD-10-CM | POA: Diagnosis not present

## 2018-08-18 DIAGNOSIS — M6281 Muscle weakness (generalized): Secondary | ICD-10-CM | POA: Diagnosis not present

## 2018-08-18 DIAGNOSIS — R296 Repeated falls: Secondary | ICD-10-CM | POA: Diagnosis not present

## 2018-08-18 DIAGNOSIS — J449 Chronic obstructive pulmonary disease, unspecified: Secondary | ICD-10-CM | POA: Diagnosis not present

## 2018-08-19 DIAGNOSIS — R278 Other lack of coordination: Secondary | ICD-10-CM | POA: Diagnosis not present

## 2018-08-19 DIAGNOSIS — Z9181 History of falling: Secondary | ICD-10-CM | POA: Diagnosis not present

## 2018-08-19 DIAGNOSIS — R2689 Other abnormalities of gait and mobility: Secondary | ICD-10-CM | POA: Diagnosis not present

## 2018-08-19 DIAGNOSIS — M6281 Muscle weakness (generalized): Secondary | ICD-10-CM | POA: Diagnosis not present

## 2018-08-19 DIAGNOSIS — R296 Repeated falls: Secondary | ICD-10-CM | POA: Diagnosis not present

## 2018-08-22 DIAGNOSIS — J189 Pneumonia, unspecified organism: Secondary | ICD-10-CM | POA: Diagnosis not present

## 2018-08-22 DIAGNOSIS — J918 Pleural effusion in other conditions classified elsewhere: Secondary | ICD-10-CM | POA: Diagnosis not present

## 2018-08-22 DIAGNOSIS — J9611 Chronic respiratory failure with hypoxia: Secondary | ICD-10-CM | POA: Diagnosis not present

## 2018-08-22 DIAGNOSIS — J449 Chronic obstructive pulmonary disease, unspecified: Secondary | ICD-10-CM | POA: Diagnosis not present

## 2018-08-22 DIAGNOSIS — I313 Pericardial effusion (noninflammatory): Secondary | ICD-10-CM | POA: Diagnosis not present

## 2018-08-22 DIAGNOSIS — I5030 Unspecified diastolic (congestive) heart failure: Secondary | ICD-10-CM | POA: Diagnosis not present

## 2018-08-23 DIAGNOSIS — I313 Pericardial effusion (noninflammatory): Secondary | ICD-10-CM | POA: Diagnosis not present

## 2018-08-23 DIAGNOSIS — J189 Pneumonia, unspecified organism: Secondary | ICD-10-CM | POA: Diagnosis not present

## 2018-08-23 DIAGNOSIS — J449 Chronic obstructive pulmonary disease, unspecified: Secondary | ICD-10-CM | POA: Diagnosis not present

## 2018-08-23 DIAGNOSIS — J918 Pleural effusion in other conditions classified elsewhere: Secondary | ICD-10-CM | POA: Diagnosis not present

## 2018-08-23 DIAGNOSIS — J9611 Chronic respiratory failure with hypoxia: Secondary | ICD-10-CM | POA: Diagnosis not present

## 2018-08-23 DIAGNOSIS — I5030 Unspecified diastolic (congestive) heart failure: Secondary | ICD-10-CM | POA: Diagnosis not present

## 2018-08-25 DIAGNOSIS — Z79899 Other long term (current) drug therapy: Secondary | ICD-10-CM | POA: Diagnosis not present

## 2018-08-25 DIAGNOSIS — J918 Pleural effusion in other conditions classified elsewhere: Secondary | ICD-10-CM | POA: Diagnosis not present

## 2018-08-25 DIAGNOSIS — J9611 Chronic respiratory failure with hypoxia: Secondary | ICD-10-CM | POA: Diagnosis not present

## 2018-08-25 DIAGNOSIS — J189 Pneumonia, unspecified organism: Secondary | ICD-10-CM | POA: Diagnosis not present

## 2018-08-25 DIAGNOSIS — I5030 Unspecified diastolic (congestive) heart failure: Secondary | ICD-10-CM | POA: Diagnosis not present

## 2018-08-25 DIAGNOSIS — I313 Pericardial effusion (noninflammatory): Secondary | ICD-10-CM | POA: Diagnosis not present

## 2018-08-25 DIAGNOSIS — J449 Chronic obstructive pulmonary disease, unspecified: Secondary | ICD-10-CM | POA: Diagnosis not present

## 2018-08-30 DIAGNOSIS — I5030 Unspecified diastolic (congestive) heart failure: Secondary | ICD-10-CM | POA: Diagnosis not present

## 2018-08-30 DIAGNOSIS — J9611 Chronic respiratory failure with hypoxia: Secondary | ICD-10-CM | POA: Diagnosis not present

## 2018-08-30 DIAGNOSIS — J449 Chronic obstructive pulmonary disease, unspecified: Secondary | ICD-10-CM | POA: Diagnosis not present

## 2018-08-30 DIAGNOSIS — J918 Pleural effusion in other conditions classified elsewhere: Secondary | ICD-10-CM | POA: Diagnosis not present

## 2018-08-30 DIAGNOSIS — J189 Pneumonia, unspecified organism: Secondary | ICD-10-CM | POA: Diagnosis not present

## 2018-08-30 DIAGNOSIS — I313 Pericardial effusion (noninflammatory): Secondary | ICD-10-CM | POA: Diagnosis not present

## 2018-08-31 DIAGNOSIS — I25119 Atherosclerotic heart disease of native coronary artery with unspecified angina pectoris: Secondary | ICD-10-CM | POA: Diagnosis not present

## 2018-08-31 DIAGNOSIS — J9611 Chronic respiratory failure with hypoxia: Secondary | ICD-10-CM | POA: Diagnosis not present

## 2018-08-31 DIAGNOSIS — I313 Pericardial effusion (noninflammatory): Secondary | ICD-10-CM | POA: Diagnosis not present

## 2018-08-31 DIAGNOSIS — Z86006 Personal history of melanoma in-situ: Secondary | ICD-10-CM | POA: Diagnosis not present

## 2018-08-31 DIAGNOSIS — J189 Pneumonia, unspecified organism: Secondary | ICD-10-CM | POA: Diagnosis not present

## 2018-08-31 DIAGNOSIS — I5030 Unspecified diastolic (congestive) heart failure: Secondary | ICD-10-CM | POA: Diagnosis not present

## 2018-08-31 DIAGNOSIS — J918 Pleural effusion in other conditions classified elsewhere: Secondary | ICD-10-CM | POA: Diagnosis not present

## 2018-08-31 DIAGNOSIS — E039 Hypothyroidism, unspecified: Secondary | ICD-10-CM | POA: Diagnosis not present

## 2018-08-31 DIAGNOSIS — I10 Essential (primary) hypertension: Secondary | ICD-10-CM | POA: Diagnosis not present

## 2018-08-31 DIAGNOSIS — R296 Repeated falls: Secondary | ICD-10-CM | POA: Diagnosis not present

## 2018-08-31 DIAGNOSIS — J449 Chronic obstructive pulmonary disease, unspecified: Secondary | ICD-10-CM | POA: Diagnosis not present

## 2018-08-31 DIAGNOSIS — E785 Hyperlipidemia, unspecified: Secondary | ICD-10-CM | POA: Diagnosis not present

## 2018-08-31 DIAGNOSIS — F0390 Unspecified dementia without behavioral disturbance: Secondary | ICD-10-CM | POA: Diagnosis not present

## 2018-09-01 DIAGNOSIS — J9611 Chronic respiratory failure with hypoxia: Secondary | ICD-10-CM | POA: Diagnosis not present

## 2018-09-01 DIAGNOSIS — J918 Pleural effusion in other conditions classified elsewhere: Secondary | ICD-10-CM | POA: Diagnosis not present

## 2018-09-01 DIAGNOSIS — I5030 Unspecified diastolic (congestive) heart failure: Secondary | ICD-10-CM | POA: Diagnosis not present

## 2018-09-01 DIAGNOSIS — I313 Pericardial effusion (noninflammatory): Secondary | ICD-10-CM | POA: Diagnosis not present

## 2018-09-01 DIAGNOSIS — J449 Chronic obstructive pulmonary disease, unspecified: Secondary | ICD-10-CM | POA: Diagnosis not present

## 2018-09-01 DIAGNOSIS — J189 Pneumonia, unspecified organism: Secondary | ICD-10-CM | POA: Diagnosis not present

## 2018-09-08 DIAGNOSIS — I5030 Unspecified diastolic (congestive) heart failure: Secondary | ICD-10-CM | POA: Diagnosis not present

## 2018-09-08 DIAGNOSIS — J918 Pleural effusion in other conditions classified elsewhere: Secondary | ICD-10-CM | POA: Diagnosis not present

## 2018-09-08 DIAGNOSIS — J189 Pneumonia, unspecified organism: Secondary | ICD-10-CM | POA: Diagnosis not present

## 2018-09-08 DIAGNOSIS — J449 Chronic obstructive pulmonary disease, unspecified: Secondary | ICD-10-CM | POA: Diagnosis not present

## 2018-09-08 DIAGNOSIS — J9611 Chronic respiratory failure with hypoxia: Secondary | ICD-10-CM | POA: Diagnosis not present

## 2018-09-08 DIAGNOSIS — I313 Pericardial effusion (noninflammatory): Secondary | ICD-10-CM | POA: Diagnosis not present

## 2018-09-15 DIAGNOSIS — J449 Chronic obstructive pulmonary disease, unspecified: Secondary | ICD-10-CM | POA: Diagnosis not present

## 2018-09-15 DIAGNOSIS — I5030 Unspecified diastolic (congestive) heart failure: Secondary | ICD-10-CM | POA: Diagnosis not present

## 2018-09-15 DIAGNOSIS — J918 Pleural effusion in other conditions classified elsewhere: Secondary | ICD-10-CM | POA: Diagnosis not present

## 2018-09-15 DIAGNOSIS — I313 Pericardial effusion (noninflammatory): Secondary | ICD-10-CM | POA: Diagnosis not present

## 2018-09-15 DIAGNOSIS — J189 Pneumonia, unspecified organism: Secondary | ICD-10-CM | POA: Diagnosis not present

## 2018-09-15 DIAGNOSIS — J9611 Chronic respiratory failure with hypoxia: Secondary | ICD-10-CM | POA: Diagnosis not present

## 2018-09-30 DIAGNOSIS — Z86006 Personal history of melanoma in-situ: Secondary | ICD-10-CM | POA: Diagnosis not present

## 2018-09-30 DIAGNOSIS — R296 Repeated falls: Secondary | ICD-10-CM | POA: Diagnosis not present

## 2018-09-30 DIAGNOSIS — Z8781 Personal history of (healed) traumatic fracture: Secondary | ICD-10-CM | POA: Diagnosis not present

## 2018-09-30 DIAGNOSIS — I25119 Atherosclerotic heart disease of native coronary artery with unspecified angina pectoris: Secondary | ICD-10-CM | POA: Diagnosis not present

## 2018-09-30 DIAGNOSIS — F0390 Unspecified dementia without behavioral disturbance: Secondary | ICD-10-CM | POA: Diagnosis not present

## 2018-09-30 DIAGNOSIS — I5030 Unspecified diastolic (congestive) heart failure: Secondary | ICD-10-CM | POA: Diagnosis not present

## 2018-09-30 DIAGNOSIS — I110 Hypertensive heart disease with heart failure: Secondary | ICD-10-CM | POA: Diagnosis not present

## 2018-09-30 DIAGNOSIS — J9611 Chronic respiratory failure with hypoxia: Secondary | ICD-10-CM | POA: Diagnosis not present

## 2018-09-30 DIAGNOSIS — J918 Pleural effusion in other conditions classified elsewhere: Secondary | ICD-10-CM | POA: Diagnosis not present

## 2018-09-30 DIAGNOSIS — Z8701 Personal history of pneumonia (recurrent): Secondary | ICD-10-CM | POA: Diagnosis not present

## 2018-09-30 DIAGNOSIS — Z741 Need for assistance with personal care: Secondary | ICD-10-CM | POA: Diagnosis not present

## 2018-09-30 DIAGNOSIS — E039 Hypothyroidism, unspecified: Secondary | ICD-10-CM | POA: Diagnosis not present

## 2018-09-30 DIAGNOSIS — Z9981 Dependence on supplemental oxygen: Secondary | ICD-10-CM | POA: Diagnosis not present

## 2018-09-30 DIAGNOSIS — S41112D Laceration without foreign body of left upper arm, subsequent encounter: Secondary | ICD-10-CM | POA: Diagnosis not present

## 2018-09-30 DIAGNOSIS — J449 Chronic obstructive pulmonary disease, unspecified: Secondary | ICD-10-CM | POA: Diagnosis not present

## 2018-09-30 DIAGNOSIS — I313 Pericardial effusion (noninflammatory): Secondary | ICD-10-CM | POA: Diagnosis not present

## 2018-09-30 DIAGNOSIS — E785 Hyperlipidemia, unspecified: Secondary | ICD-10-CM | POA: Diagnosis not present

## 2018-10-03 DIAGNOSIS — J9611 Chronic respiratory failure with hypoxia: Secondary | ICD-10-CM | POA: Diagnosis not present

## 2018-10-03 DIAGNOSIS — I110 Hypertensive heart disease with heart failure: Secondary | ICD-10-CM | POA: Diagnosis not present

## 2018-10-03 DIAGNOSIS — J918 Pleural effusion in other conditions classified elsewhere: Secondary | ICD-10-CM | POA: Diagnosis not present

## 2018-10-03 DIAGNOSIS — I313 Pericardial effusion (noninflammatory): Secondary | ICD-10-CM | POA: Diagnosis not present

## 2018-10-03 DIAGNOSIS — I5030 Unspecified diastolic (congestive) heart failure: Secondary | ICD-10-CM | POA: Diagnosis not present

## 2018-10-03 DIAGNOSIS — J449 Chronic obstructive pulmonary disease, unspecified: Secondary | ICD-10-CM | POA: Diagnosis not present

## 2018-10-06 DIAGNOSIS — J449 Chronic obstructive pulmonary disease, unspecified: Secondary | ICD-10-CM | POA: Diagnosis not present

## 2018-10-06 DIAGNOSIS — J918 Pleural effusion in other conditions classified elsewhere: Secondary | ICD-10-CM | POA: Diagnosis not present

## 2018-10-06 DIAGNOSIS — I110 Hypertensive heart disease with heart failure: Secondary | ICD-10-CM | POA: Diagnosis not present

## 2018-10-06 DIAGNOSIS — I313 Pericardial effusion (noninflammatory): Secondary | ICD-10-CM | POA: Diagnosis not present

## 2018-10-06 DIAGNOSIS — I5030 Unspecified diastolic (congestive) heart failure: Secondary | ICD-10-CM | POA: Diagnosis not present

## 2018-10-06 DIAGNOSIS — J9611 Chronic respiratory failure with hypoxia: Secondary | ICD-10-CM | POA: Diagnosis not present

## 2018-10-13 DIAGNOSIS — I110 Hypertensive heart disease with heart failure: Secondary | ICD-10-CM | POA: Diagnosis not present

## 2018-10-13 DIAGNOSIS — J449 Chronic obstructive pulmonary disease, unspecified: Secondary | ICD-10-CM | POA: Diagnosis not present

## 2018-10-13 DIAGNOSIS — J9611 Chronic respiratory failure with hypoxia: Secondary | ICD-10-CM | POA: Diagnosis not present

## 2018-10-13 DIAGNOSIS — J918 Pleural effusion in other conditions classified elsewhere: Secondary | ICD-10-CM | POA: Diagnosis not present

## 2018-10-13 DIAGNOSIS — I5030 Unspecified diastolic (congestive) heart failure: Secondary | ICD-10-CM | POA: Diagnosis not present

## 2018-10-13 DIAGNOSIS — I313 Pericardial effusion (noninflammatory): Secondary | ICD-10-CM | POA: Diagnosis not present

## 2018-10-17 DIAGNOSIS — I110 Hypertensive heart disease with heart failure: Secondary | ICD-10-CM | POA: Diagnosis not present

## 2018-10-17 DIAGNOSIS — J449 Chronic obstructive pulmonary disease, unspecified: Secondary | ICD-10-CM | POA: Diagnosis not present

## 2018-10-17 DIAGNOSIS — J9611 Chronic respiratory failure with hypoxia: Secondary | ICD-10-CM | POA: Diagnosis not present

## 2018-10-17 DIAGNOSIS — I5030 Unspecified diastolic (congestive) heart failure: Secondary | ICD-10-CM | POA: Diagnosis not present

## 2018-10-17 DIAGNOSIS — I313 Pericardial effusion (noninflammatory): Secondary | ICD-10-CM | POA: Diagnosis not present

## 2018-10-17 DIAGNOSIS — J918 Pleural effusion in other conditions classified elsewhere: Secondary | ICD-10-CM | POA: Diagnosis not present

## 2018-10-31 DIAGNOSIS — R296 Repeated falls: Secondary | ICD-10-CM | POA: Diagnosis not present

## 2018-10-31 DIAGNOSIS — I25119 Atherosclerotic heart disease of native coronary artery with unspecified angina pectoris: Secondary | ICD-10-CM | POA: Diagnosis not present

## 2018-10-31 DIAGNOSIS — Z9981 Dependence on supplemental oxygen: Secondary | ICD-10-CM | POA: Diagnosis not present

## 2018-10-31 DIAGNOSIS — I110 Hypertensive heart disease with heart failure: Secondary | ICD-10-CM | POA: Diagnosis not present

## 2018-10-31 DIAGNOSIS — Z79899 Other long term (current) drug therapy: Secondary | ICD-10-CM | POA: Diagnosis not present

## 2018-10-31 DIAGNOSIS — E785 Hyperlipidemia, unspecified: Secondary | ICD-10-CM | POA: Diagnosis not present

## 2018-10-31 DIAGNOSIS — E039 Hypothyroidism, unspecified: Secondary | ICD-10-CM | POA: Diagnosis not present

## 2018-10-31 DIAGNOSIS — F0390 Unspecified dementia without behavioral disturbance: Secondary | ICD-10-CM | POA: Diagnosis not present

## 2018-10-31 DIAGNOSIS — S41112D Laceration without foreign body of left upper arm, subsequent encounter: Secondary | ICD-10-CM | POA: Diagnosis not present

## 2018-10-31 DIAGNOSIS — Z8781 Personal history of (healed) traumatic fracture: Secondary | ICD-10-CM | POA: Diagnosis not present

## 2018-10-31 DIAGNOSIS — F331 Major depressive disorder, recurrent, moderate: Secondary | ICD-10-CM | POA: Diagnosis not present

## 2018-10-31 DIAGNOSIS — Z86006 Personal history of melanoma in-situ: Secondary | ICD-10-CM | POA: Diagnosis not present

## 2018-10-31 DIAGNOSIS — Z8701 Personal history of pneumonia (recurrent): Secondary | ICD-10-CM | POA: Diagnosis not present

## 2018-10-31 DIAGNOSIS — J449 Chronic obstructive pulmonary disease, unspecified: Secondary | ICD-10-CM | POA: Diagnosis not present

## 2018-10-31 DIAGNOSIS — Z741 Need for assistance with personal care: Secondary | ICD-10-CM | POA: Diagnosis not present

## 2018-10-31 DIAGNOSIS — I5030 Unspecified diastolic (congestive) heart failure: Secondary | ICD-10-CM | POA: Diagnosis not present

## 2018-10-31 DIAGNOSIS — I313 Pericardial effusion (noninflammatory): Secondary | ICD-10-CM | POA: Diagnosis not present

## 2018-10-31 DIAGNOSIS — J918 Pleural effusion in other conditions classified elsewhere: Secondary | ICD-10-CM | POA: Diagnosis not present

## 2018-10-31 DIAGNOSIS — J9611 Chronic respiratory failure with hypoxia: Secondary | ICD-10-CM | POA: Diagnosis not present

## 2018-10-31 DIAGNOSIS — I10 Essential (primary) hypertension: Secondary | ICD-10-CM | POA: Diagnosis not present

## 2018-11-04 DIAGNOSIS — I313 Pericardial effusion (noninflammatory): Secondary | ICD-10-CM | POA: Diagnosis not present

## 2018-11-04 DIAGNOSIS — I110 Hypertensive heart disease with heart failure: Secondary | ICD-10-CM | POA: Diagnosis not present

## 2018-11-04 DIAGNOSIS — J449 Chronic obstructive pulmonary disease, unspecified: Secondary | ICD-10-CM | POA: Diagnosis not present

## 2018-11-04 DIAGNOSIS — I5030 Unspecified diastolic (congestive) heart failure: Secondary | ICD-10-CM | POA: Diagnosis not present

## 2018-11-04 DIAGNOSIS — J918 Pleural effusion in other conditions classified elsewhere: Secondary | ICD-10-CM | POA: Diagnosis not present

## 2018-11-04 DIAGNOSIS — J9611 Chronic respiratory failure with hypoxia: Secondary | ICD-10-CM | POA: Diagnosis not present

## 2018-11-14 DIAGNOSIS — J918 Pleural effusion in other conditions classified elsewhere: Secondary | ICD-10-CM | POA: Diagnosis not present

## 2018-11-14 DIAGNOSIS — I110 Hypertensive heart disease with heart failure: Secondary | ICD-10-CM | POA: Diagnosis not present

## 2018-11-14 DIAGNOSIS — I313 Pericardial effusion (noninflammatory): Secondary | ICD-10-CM | POA: Diagnosis not present

## 2018-11-14 DIAGNOSIS — I5030 Unspecified diastolic (congestive) heart failure: Secondary | ICD-10-CM | POA: Diagnosis not present

## 2018-11-14 DIAGNOSIS — J9611 Chronic respiratory failure with hypoxia: Secondary | ICD-10-CM | POA: Diagnosis not present

## 2018-11-14 DIAGNOSIS — J449 Chronic obstructive pulmonary disease, unspecified: Secondary | ICD-10-CM | POA: Diagnosis not present

## 2018-11-28 DIAGNOSIS — I313 Pericardial effusion (noninflammatory): Secondary | ICD-10-CM | POA: Diagnosis not present

## 2018-11-28 DIAGNOSIS — J9611 Chronic respiratory failure with hypoxia: Secondary | ICD-10-CM | POA: Diagnosis not present

## 2018-11-28 DIAGNOSIS — J918 Pleural effusion in other conditions classified elsewhere: Secondary | ICD-10-CM | POA: Diagnosis not present

## 2018-11-28 DIAGNOSIS — J449 Chronic obstructive pulmonary disease, unspecified: Secondary | ICD-10-CM | POA: Diagnosis not present

## 2018-11-28 DIAGNOSIS — I5030 Unspecified diastolic (congestive) heart failure: Secondary | ICD-10-CM | POA: Diagnosis not present

## 2018-11-28 DIAGNOSIS — I110 Hypertensive heart disease with heart failure: Secondary | ICD-10-CM | POA: Diagnosis not present

## 2018-11-30 DIAGNOSIS — Z8701 Personal history of pneumonia (recurrent): Secondary | ICD-10-CM | POA: Diagnosis not present

## 2018-11-30 DIAGNOSIS — I5030 Unspecified diastolic (congestive) heart failure: Secondary | ICD-10-CM | POA: Diagnosis not present

## 2018-11-30 DIAGNOSIS — E785 Hyperlipidemia, unspecified: Secondary | ICD-10-CM | POA: Diagnosis not present

## 2018-11-30 DIAGNOSIS — Z86006 Personal history of melanoma in-situ: Secondary | ICD-10-CM | POA: Diagnosis not present

## 2018-11-30 DIAGNOSIS — Z9981 Dependence on supplemental oxygen: Secondary | ICD-10-CM | POA: Diagnosis not present

## 2018-11-30 DIAGNOSIS — J9611 Chronic respiratory failure with hypoxia: Secondary | ICD-10-CM | POA: Diagnosis not present

## 2018-11-30 DIAGNOSIS — E039 Hypothyroidism, unspecified: Secondary | ICD-10-CM | POA: Diagnosis not present

## 2018-11-30 DIAGNOSIS — J449 Chronic obstructive pulmonary disease, unspecified: Secondary | ICD-10-CM | POA: Diagnosis not present

## 2018-11-30 DIAGNOSIS — R296 Repeated falls: Secondary | ICD-10-CM | POA: Diagnosis not present

## 2018-11-30 DIAGNOSIS — I313 Pericardial effusion (noninflammatory): Secondary | ICD-10-CM | POA: Diagnosis not present

## 2018-11-30 DIAGNOSIS — I110 Hypertensive heart disease with heart failure: Secondary | ICD-10-CM | POA: Diagnosis not present

## 2018-11-30 DIAGNOSIS — J918 Pleural effusion in other conditions classified elsewhere: Secondary | ICD-10-CM | POA: Diagnosis not present

## 2018-11-30 DIAGNOSIS — S41112D Laceration without foreign body of left upper arm, subsequent encounter: Secondary | ICD-10-CM | POA: Diagnosis not present

## 2018-11-30 DIAGNOSIS — Z8781 Personal history of (healed) traumatic fracture: Secondary | ICD-10-CM | POA: Diagnosis not present

## 2018-11-30 DIAGNOSIS — Z741 Need for assistance with personal care: Secondary | ICD-10-CM | POA: Diagnosis not present

## 2018-11-30 DIAGNOSIS — F0390 Unspecified dementia without behavioral disturbance: Secondary | ICD-10-CM | POA: Diagnosis not present

## 2018-11-30 DIAGNOSIS — I25119 Atherosclerotic heart disease of native coronary artery with unspecified angina pectoris: Secondary | ICD-10-CM | POA: Diagnosis not present

## 2018-12-01 DIAGNOSIS — J449 Chronic obstructive pulmonary disease, unspecified: Secondary | ICD-10-CM | POA: Diagnosis not present

## 2018-12-01 DIAGNOSIS — I5030 Unspecified diastolic (congestive) heart failure: Secondary | ICD-10-CM | POA: Diagnosis not present

## 2018-12-01 DIAGNOSIS — I110 Hypertensive heart disease with heart failure: Secondary | ICD-10-CM | POA: Diagnosis not present

## 2018-12-01 DIAGNOSIS — J918 Pleural effusion in other conditions classified elsewhere: Secondary | ICD-10-CM | POA: Diagnosis not present

## 2018-12-01 DIAGNOSIS — I313 Pericardial effusion (noninflammatory): Secondary | ICD-10-CM | POA: Diagnosis not present

## 2018-12-01 DIAGNOSIS — J9611 Chronic respiratory failure with hypoxia: Secondary | ICD-10-CM | POA: Diagnosis not present

## 2018-12-05 DIAGNOSIS — I5030 Unspecified diastolic (congestive) heart failure: Secondary | ICD-10-CM | POA: Diagnosis not present

## 2018-12-05 DIAGNOSIS — J9611 Chronic respiratory failure with hypoxia: Secondary | ICD-10-CM | POA: Diagnosis not present

## 2018-12-05 DIAGNOSIS — I110 Hypertensive heart disease with heart failure: Secondary | ICD-10-CM | POA: Diagnosis not present

## 2018-12-05 DIAGNOSIS — I313 Pericardial effusion (noninflammatory): Secondary | ICD-10-CM | POA: Diagnosis not present

## 2018-12-05 DIAGNOSIS — J918 Pleural effusion in other conditions classified elsewhere: Secondary | ICD-10-CM | POA: Diagnosis not present

## 2018-12-05 DIAGNOSIS — J449 Chronic obstructive pulmonary disease, unspecified: Secondary | ICD-10-CM | POA: Diagnosis not present

## 2018-12-12 DIAGNOSIS — J9611 Chronic respiratory failure with hypoxia: Secondary | ICD-10-CM | POA: Diagnosis not present

## 2018-12-12 DIAGNOSIS — I110 Hypertensive heart disease with heart failure: Secondary | ICD-10-CM | POA: Diagnosis not present

## 2018-12-12 DIAGNOSIS — J449 Chronic obstructive pulmonary disease, unspecified: Secondary | ICD-10-CM | POA: Diagnosis not present

## 2018-12-12 DIAGNOSIS — I313 Pericardial effusion (noninflammatory): Secondary | ICD-10-CM | POA: Diagnosis not present

## 2018-12-12 DIAGNOSIS — J918 Pleural effusion in other conditions classified elsewhere: Secondary | ICD-10-CM | POA: Diagnosis not present

## 2018-12-12 DIAGNOSIS — I5030 Unspecified diastolic (congestive) heart failure: Secondary | ICD-10-CM | POA: Diagnosis not present

## 2018-12-29 DIAGNOSIS — I739 Peripheral vascular disease, unspecified: Secondary | ICD-10-CM | POA: Diagnosis not present

## 2018-12-29 DIAGNOSIS — B351 Tinea unguium: Secondary | ICD-10-CM | POA: Diagnosis not present

## 2018-12-31 DIAGNOSIS — F0390 Unspecified dementia without behavioral disturbance: Secondary | ICD-10-CM | POA: Diagnosis not present

## 2018-12-31 DIAGNOSIS — Z6825 Body mass index (BMI) 25.0-25.9, adult: Secondary | ICD-10-CM | POA: Diagnosis not present

## 2018-12-31 DIAGNOSIS — Z8701 Personal history of pneumonia (recurrent): Secondary | ICD-10-CM | POA: Diagnosis not present

## 2018-12-31 DIAGNOSIS — J918 Pleural effusion in other conditions classified elsewhere: Secondary | ICD-10-CM | POA: Diagnosis not present

## 2018-12-31 DIAGNOSIS — Z9981 Dependence on supplemental oxygen: Secondary | ICD-10-CM | POA: Diagnosis not present

## 2018-12-31 DIAGNOSIS — I313 Pericardial effusion (noninflammatory): Secondary | ICD-10-CM | POA: Diagnosis not present

## 2018-12-31 DIAGNOSIS — Z741 Need for assistance with personal care: Secondary | ICD-10-CM | POA: Diagnosis not present

## 2018-12-31 DIAGNOSIS — S41112D Laceration without foreign body of left upper arm, subsequent encounter: Secondary | ICD-10-CM | POA: Diagnosis not present

## 2018-12-31 DIAGNOSIS — I5030 Unspecified diastolic (congestive) heart failure: Secondary | ICD-10-CM | POA: Diagnosis not present

## 2018-12-31 DIAGNOSIS — Z86006 Personal history of melanoma in-situ: Secondary | ICD-10-CM | POA: Diagnosis not present

## 2018-12-31 DIAGNOSIS — R159 Full incontinence of feces: Secondary | ICD-10-CM | POA: Diagnosis not present

## 2018-12-31 DIAGNOSIS — J9611 Chronic respiratory failure with hypoxia: Secondary | ICD-10-CM | POA: Diagnosis not present

## 2018-12-31 DIAGNOSIS — Z8781 Personal history of (healed) traumatic fracture: Secondary | ICD-10-CM | POA: Diagnosis not present

## 2018-12-31 DIAGNOSIS — R32 Unspecified urinary incontinence: Secondary | ICD-10-CM | POA: Diagnosis not present

## 2018-12-31 DIAGNOSIS — R296 Repeated falls: Secondary | ICD-10-CM | POA: Diagnosis not present

## 2018-12-31 DIAGNOSIS — I25119 Atherosclerotic heart disease of native coronary artery with unspecified angina pectoris: Secondary | ICD-10-CM | POA: Diagnosis not present

## 2018-12-31 DIAGNOSIS — J449 Chronic obstructive pulmonary disease, unspecified: Secondary | ICD-10-CM | POA: Diagnosis not present

## 2018-12-31 DIAGNOSIS — E039 Hypothyroidism, unspecified: Secondary | ICD-10-CM | POA: Diagnosis not present

## 2018-12-31 DIAGNOSIS — E785 Hyperlipidemia, unspecified: Secondary | ICD-10-CM | POA: Diagnosis not present

## 2018-12-31 DIAGNOSIS — I110 Hypertensive heart disease with heart failure: Secondary | ICD-10-CM | POA: Diagnosis not present

## 2019-01-08 DIAGNOSIS — I110 Hypertensive heart disease with heart failure: Secondary | ICD-10-CM | POA: Diagnosis not present

## 2019-01-08 DIAGNOSIS — J918 Pleural effusion in other conditions classified elsewhere: Secondary | ICD-10-CM | POA: Diagnosis not present

## 2019-01-08 DIAGNOSIS — I313 Pericardial effusion (noninflammatory): Secondary | ICD-10-CM | POA: Diagnosis not present

## 2019-01-08 DIAGNOSIS — J449 Chronic obstructive pulmonary disease, unspecified: Secondary | ICD-10-CM | POA: Diagnosis not present

## 2019-01-08 DIAGNOSIS — J9611 Chronic respiratory failure with hypoxia: Secondary | ICD-10-CM | POA: Diagnosis not present

## 2019-01-08 DIAGNOSIS — I5030 Unspecified diastolic (congestive) heart failure: Secondary | ICD-10-CM | POA: Diagnosis not present

## 2019-01-19 DIAGNOSIS — Z20828 Contact with and (suspected) exposure to other viral communicable diseases: Secondary | ICD-10-CM | POA: Diagnosis not present

## 2019-01-23 DIAGNOSIS — J449 Chronic obstructive pulmonary disease, unspecified: Secondary | ICD-10-CM | POA: Diagnosis not present

## 2019-01-23 DIAGNOSIS — I313 Pericardial effusion (noninflammatory): Secondary | ICD-10-CM | POA: Diagnosis not present

## 2019-01-23 DIAGNOSIS — I5030 Unspecified diastolic (congestive) heart failure: Secondary | ICD-10-CM | POA: Diagnosis not present

## 2019-01-23 DIAGNOSIS — J918 Pleural effusion in other conditions classified elsewhere: Secondary | ICD-10-CM | POA: Diagnosis not present

## 2019-01-23 DIAGNOSIS — I110 Hypertensive heart disease with heart failure: Secondary | ICD-10-CM | POA: Diagnosis not present

## 2019-01-23 DIAGNOSIS — J9611 Chronic respiratory failure with hypoxia: Secondary | ICD-10-CM | POA: Diagnosis not present

## 2019-01-31 DIAGNOSIS — F0390 Unspecified dementia without behavioral disturbance: Secondary | ICD-10-CM | POA: Diagnosis not present

## 2019-01-31 DIAGNOSIS — S41112D Laceration without foreign body of left upper arm, subsequent encounter: Secondary | ICD-10-CM | POA: Diagnosis not present

## 2019-01-31 DIAGNOSIS — I25119 Atherosclerotic heart disease of native coronary artery with unspecified angina pectoris: Secondary | ICD-10-CM | POA: Diagnosis not present

## 2019-01-31 DIAGNOSIS — Z741 Need for assistance with personal care: Secondary | ICD-10-CM | POA: Diagnosis not present

## 2019-01-31 DIAGNOSIS — R159 Full incontinence of feces: Secondary | ICD-10-CM | POA: Diagnosis not present

## 2019-01-31 DIAGNOSIS — R32 Unspecified urinary incontinence: Secondary | ICD-10-CM | POA: Diagnosis not present

## 2019-01-31 DIAGNOSIS — Z6825 Body mass index (BMI) 25.0-25.9, adult: Secondary | ICD-10-CM | POA: Diagnosis not present

## 2019-01-31 DIAGNOSIS — R296 Repeated falls: Secondary | ICD-10-CM | POA: Diagnosis not present

## 2019-01-31 DIAGNOSIS — I313 Pericardial effusion (noninflammatory): Secondary | ICD-10-CM | POA: Diagnosis not present

## 2019-01-31 DIAGNOSIS — Z8781 Personal history of (healed) traumatic fracture: Secondary | ICD-10-CM | POA: Diagnosis not present

## 2019-01-31 DIAGNOSIS — I5030 Unspecified diastolic (congestive) heart failure: Secondary | ICD-10-CM | POA: Diagnosis not present

## 2019-01-31 DIAGNOSIS — E785 Hyperlipidemia, unspecified: Secondary | ICD-10-CM | POA: Diagnosis not present

## 2019-01-31 DIAGNOSIS — J449 Chronic obstructive pulmonary disease, unspecified: Secondary | ICD-10-CM | POA: Diagnosis not present

## 2019-01-31 DIAGNOSIS — I110 Hypertensive heart disease with heart failure: Secondary | ICD-10-CM | POA: Diagnosis not present

## 2019-01-31 DIAGNOSIS — Z9981 Dependence on supplemental oxygen: Secondary | ICD-10-CM | POA: Diagnosis not present

## 2019-01-31 DIAGNOSIS — Z86006 Personal history of melanoma in-situ: Secondary | ICD-10-CM | POA: Diagnosis not present

## 2019-01-31 DIAGNOSIS — Z8701 Personal history of pneumonia (recurrent): Secondary | ICD-10-CM | POA: Diagnosis not present

## 2019-01-31 DIAGNOSIS — E039 Hypothyroidism, unspecified: Secondary | ICD-10-CM | POA: Diagnosis not present

## 2019-01-31 DIAGNOSIS — J9611 Chronic respiratory failure with hypoxia: Secondary | ICD-10-CM | POA: Diagnosis not present

## 2019-01-31 DIAGNOSIS — J918 Pleural effusion in other conditions classified elsewhere: Secondary | ICD-10-CM | POA: Diagnosis not present

## 2019-02-01 DIAGNOSIS — J918 Pleural effusion in other conditions classified elsewhere: Secondary | ICD-10-CM | POA: Diagnosis not present

## 2019-02-01 DIAGNOSIS — I313 Pericardial effusion (noninflammatory): Secondary | ICD-10-CM | POA: Diagnosis not present

## 2019-02-01 DIAGNOSIS — I5030 Unspecified diastolic (congestive) heart failure: Secondary | ICD-10-CM | POA: Diagnosis not present

## 2019-02-01 DIAGNOSIS — I110 Hypertensive heart disease with heart failure: Secondary | ICD-10-CM | POA: Diagnosis not present

## 2019-02-01 DIAGNOSIS — J449 Chronic obstructive pulmonary disease, unspecified: Secondary | ICD-10-CM | POA: Diagnosis not present

## 2019-02-01 DIAGNOSIS — J9611 Chronic respiratory failure with hypoxia: Secondary | ICD-10-CM | POA: Diagnosis not present

## 2019-02-03 DIAGNOSIS — I5030 Unspecified diastolic (congestive) heart failure: Secondary | ICD-10-CM | POA: Diagnosis not present

## 2019-02-03 DIAGNOSIS — J449 Chronic obstructive pulmonary disease, unspecified: Secondary | ICD-10-CM | POA: Diagnosis not present

## 2019-02-03 DIAGNOSIS — J9611 Chronic respiratory failure with hypoxia: Secondary | ICD-10-CM | POA: Diagnosis not present

## 2019-02-03 DIAGNOSIS — I313 Pericardial effusion (noninflammatory): Secondary | ICD-10-CM | POA: Diagnosis not present

## 2019-02-03 DIAGNOSIS — J918 Pleural effusion in other conditions classified elsewhere: Secondary | ICD-10-CM | POA: Diagnosis not present

## 2019-02-03 DIAGNOSIS — I110 Hypertensive heart disease with heart failure: Secondary | ICD-10-CM | POA: Diagnosis not present

## 2019-02-08 DIAGNOSIS — I313 Pericardial effusion (noninflammatory): Secondary | ICD-10-CM | POA: Diagnosis not present

## 2019-02-08 DIAGNOSIS — J449 Chronic obstructive pulmonary disease, unspecified: Secondary | ICD-10-CM | POA: Diagnosis not present

## 2019-02-08 DIAGNOSIS — I5030 Unspecified diastolic (congestive) heart failure: Secondary | ICD-10-CM | POA: Diagnosis not present

## 2019-02-08 DIAGNOSIS — J9611 Chronic respiratory failure with hypoxia: Secondary | ICD-10-CM | POA: Diagnosis not present

## 2019-02-08 DIAGNOSIS — J918 Pleural effusion in other conditions classified elsewhere: Secondary | ICD-10-CM | POA: Diagnosis not present

## 2019-02-08 DIAGNOSIS — I110 Hypertensive heart disease with heart failure: Secondary | ICD-10-CM | POA: Diagnosis not present

## 2019-02-12 IMAGING — CT CT CERVICAL SPINE W/O CM
5 of 8 series · 13 of 33 positions shown, 14 images · non-contrast
Comparison: CT of the head and cervical spine performed 05/26/2017,
and MRI of the brain performed 04/06/2015

CLINICAL DATA: Status post fall, hitting back of head. Scalp
swelling. Concern for cervical spine injury.

EXAM:
CT HEAD WITHOUT CONTRAST
CT CERVICAL SPINE WITHOUT CONTRAST
TECHNIQUE: Multidetector CT imaging of the head and cervical spine was
performed following the standard protocol without intravenous
contrast. Multiplanar CT image reconstructions of the cervical spine
were also generated.

[Series 9: c spine soft · axial · 0.33mm/px · z∈[+1182,+1236]mm · 2 of 83 slices shown]
[im 28/83  soft-tissue]
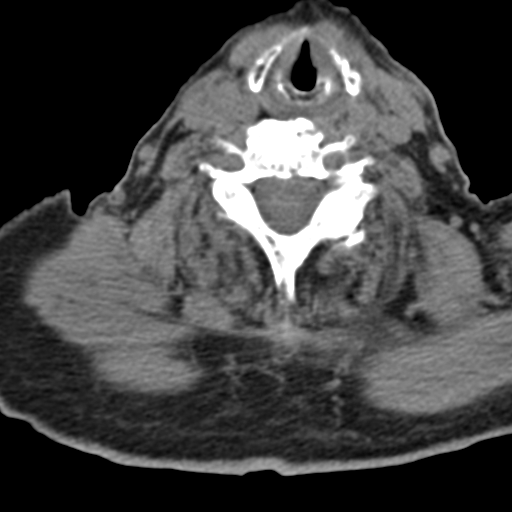
[im 55/83  soft-tissue]
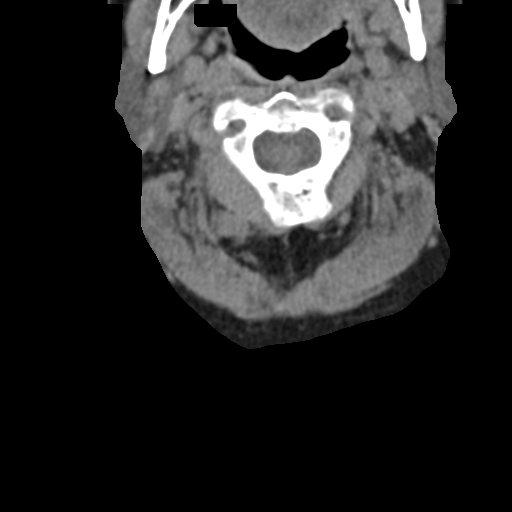

[Series 11: orthogonal bone · axial · 0.29mm/px · z∈[+1171,+1225]mm · 2 of 82 slices shown (1 of 2)]
[im 28/82  bone]
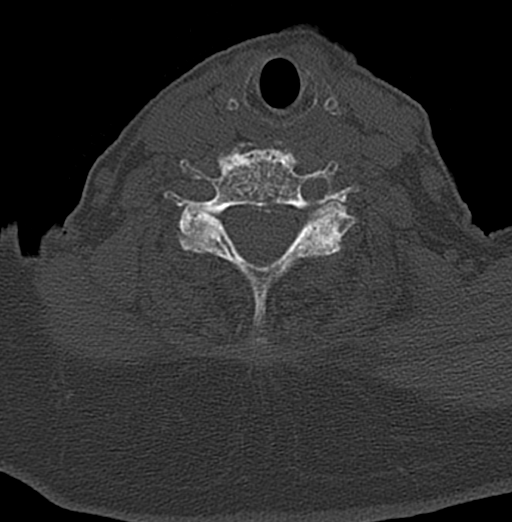
[im 55/82  bone]
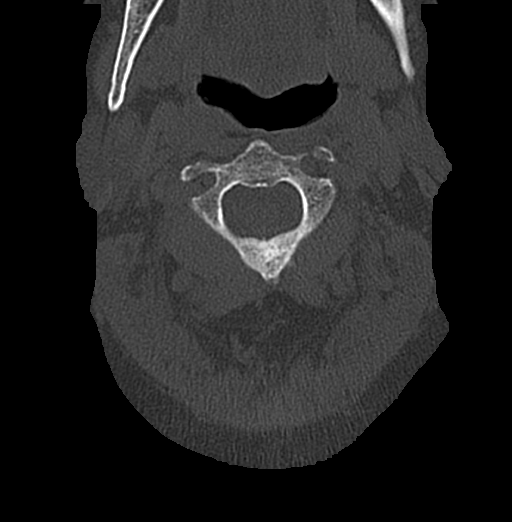

[Series 12: orthogonal bone · axial · 0.29mm/px · z∈[+1133,+1213]mm · 3 of 102 slices shown, 4 images (2 of 2)]
[im 26/102  soft-tissue]
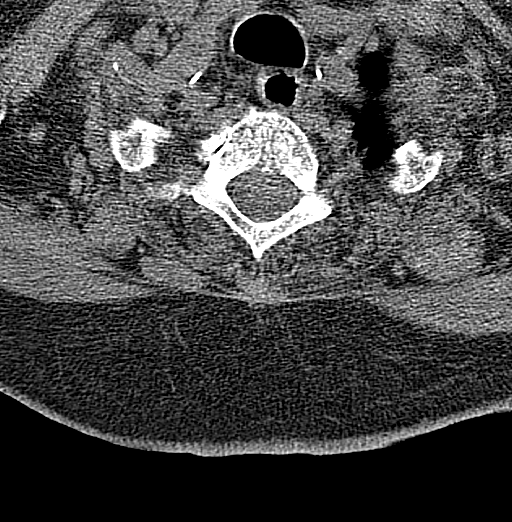
[im 26/102  bone]
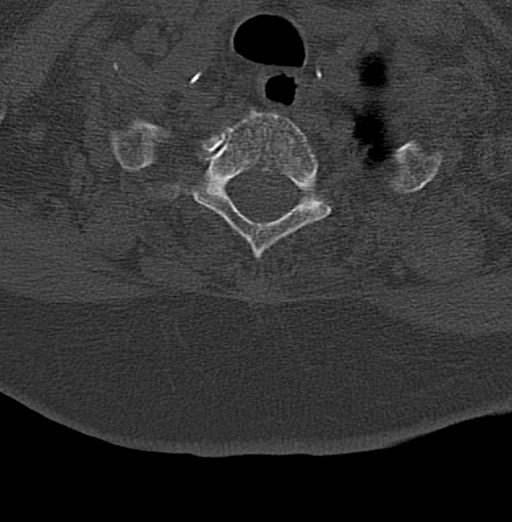
[im 51/102  bone]
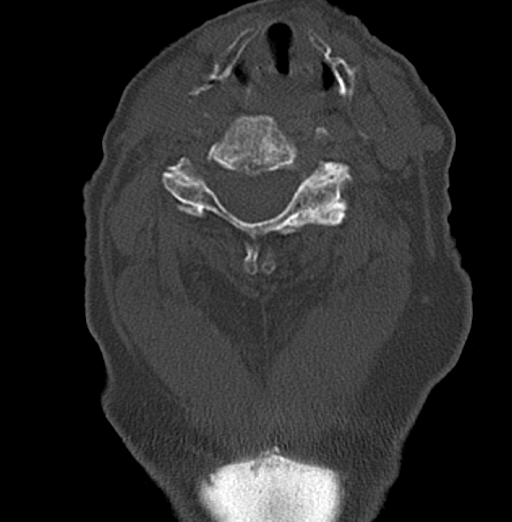
[im 76/102  bone]
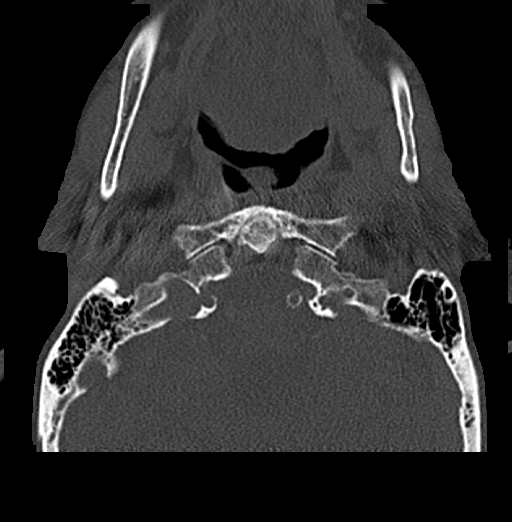

[Series 13: coronal bone · coronal · 0.24mm/px · 1 of 61 slices shown]
[im 31/61  bone]
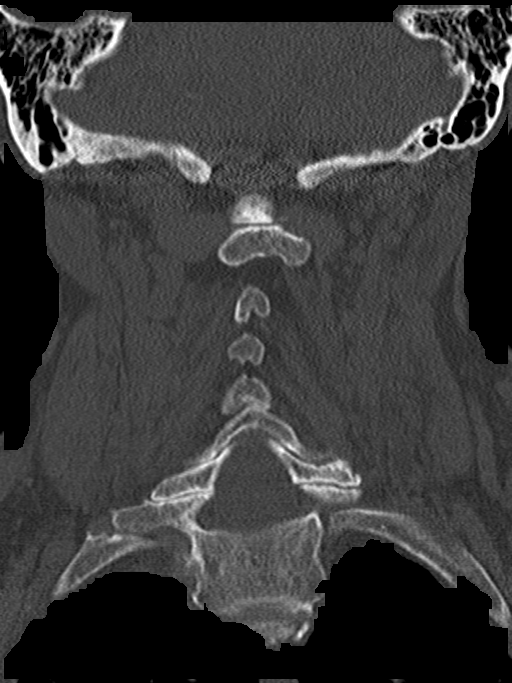

[Series 15: sagittal bone · sagittal · 0.31mm/px · 5 of 61 slices shown]
[im 11/61  bone]
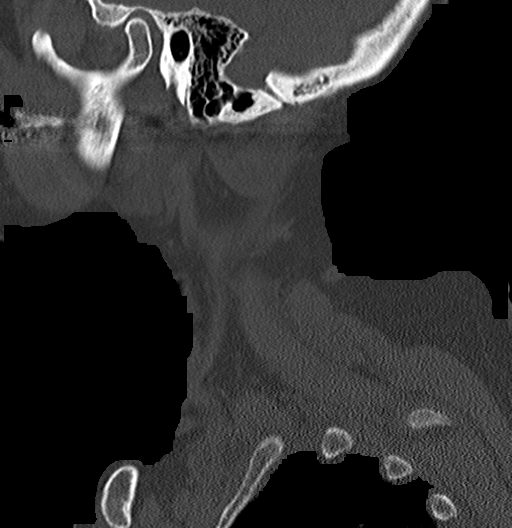
[im 21/61  bone]
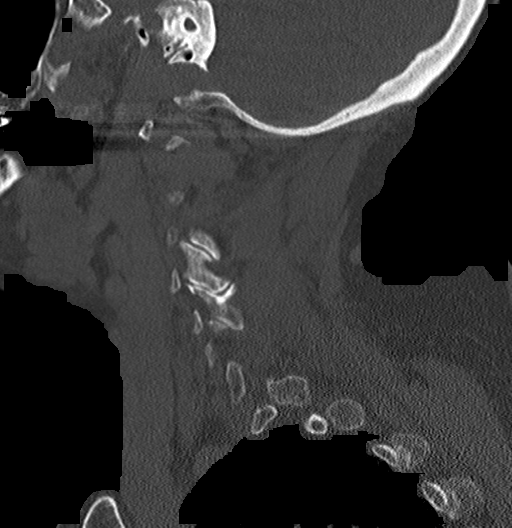
[im 31/61  bone]
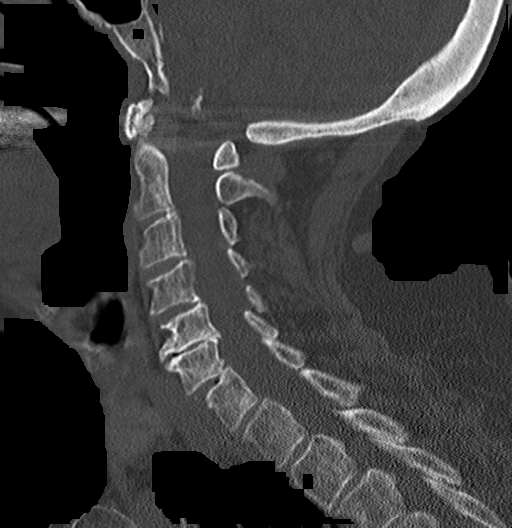
[im 41/61  bone]
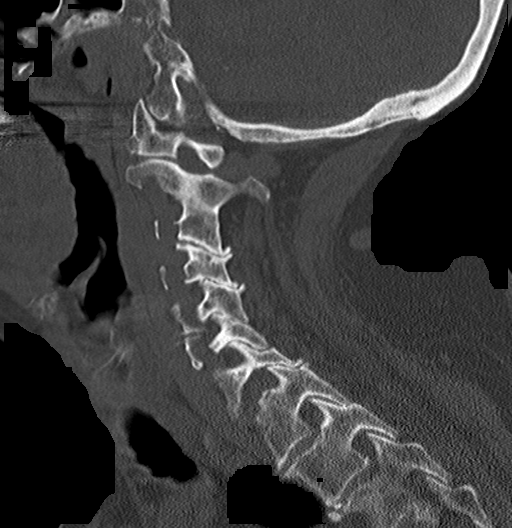
[im 51/61  bone]
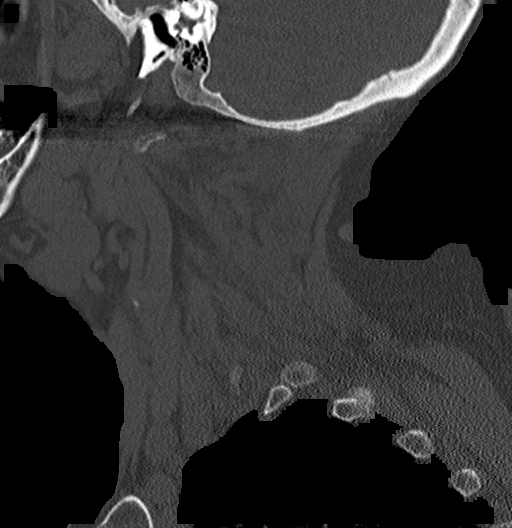

[13 of 33 positions shown; findings below may reference images not displayed]

FINDINGS: CT HEAD FINDINGS

Brain: No evidence of acute infarction, hemorrhage, hydrocephalus,
extra-axial collection or mass lesion / mass effect.

Prominence of the ventricles and sulci reflects moderately severe
cortical volume loss. Mild cerebellar atrophy is noted. Scattered
periventricular and subcortical white matter change likely reflects
small vessel ischemic microangiopathy.

The brainstem and fourth ventricle are within normal limits. The
basal ganglia are unremarkable in appearance. The cerebral
hemispheres demonstrate grossly normal gray-white differentiation.
No mass effect or midline shift is seen.

Vascular: No hyperdense vessel or unexpected calcification.

Skull: There is no evidence of fracture; visualized osseous
structures are unremarkable in appearance.

Sinuses/Orbits: The orbits are within normal limits. The paranasal
sinuses and mastoid air cells are well-aerated.

Other: Mild soft tissue swelling is noted at the posterior vertex.

CT CERVICAL SPINE FINDINGS

Alignment: There is grade 1 anterolisthesis of C6 on C7.

Skull base and vertebrae: No acute fracture. No primary bone lesion
or focal pathologic process.

Soft tissues and spinal canal: No prevertebral fluid or swelling. No
visible canal hematoma.

Disc levels: Intervertebral disc space narrowing is noted at C5-C6.
Vacuum phenomenon is noted along the lower cervical spine, with
anterior and posterior disc osteophyte complexes.

Upper chest: Minimal scarring is noted at the lung apices. Small
nodules at the lung apices measure up to 4 mm in size. The thyroid
gland is unremarkable in appearance.

Other: No additional soft tissue abnormalities are seen.
IMPRESSION: 1. No evidence of traumatic intracranial injury or fracture.
2. No evidence of fracture or subluxation along the cervical spine.
3. Mild soft tissue swelling at the posterior vertex.
4. Moderately severe cortical volume loss and scattered small vessel
ischemic microangiopathy.
5. Mild degenerative change noted along the cervical spine.
6. Small nodules at the lung apices measure up to 4 mm in size. No
follow-up needed if patient is low-risk (and has no known or
suspected primary neoplasm). Non-contrast chest CT can be considered
in 12 months if patient is high-risk. This recommendation follows
the consensus statement: Guidelines for Management of Incidental
Pulmonary Nodules Detected on CT Images: From the [HOSPITAL]
7. Minimal scarring at the lung apices.

## 2019-02-15 DIAGNOSIS — I110 Hypertensive heart disease with heart failure: Secondary | ICD-10-CM | POA: Diagnosis not present

## 2019-02-15 DIAGNOSIS — I5030 Unspecified diastolic (congestive) heart failure: Secondary | ICD-10-CM | POA: Diagnosis not present

## 2019-02-15 DIAGNOSIS — J9611 Chronic respiratory failure with hypoxia: Secondary | ICD-10-CM | POA: Diagnosis not present

## 2019-02-15 DIAGNOSIS — I313 Pericardial effusion (noninflammatory): Secondary | ICD-10-CM | POA: Diagnosis not present

## 2019-02-15 DIAGNOSIS — J918 Pleural effusion in other conditions classified elsewhere: Secondary | ICD-10-CM | POA: Diagnosis not present

## 2019-02-15 DIAGNOSIS — J449 Chronic obstructive pulmonary disease, unspecified: Secondary | ICD-10-CM | POA: Diagnosis not present

## 2019-02-20 DIAGNOSIS — J449 Chronic obstructive pulmonary disease, unspecified: Secondary | ICD-10-CM | POA: Diagnosis not present

## 2019-02-20 DIAGNOSIS — I110 Hypertensive heart disease with heart failure: Secondary | ICD-10-CM | POA: Diagnosis not present

## 2019-02-20 DIAGNOSIS — I5030 Unspecified diastolic (congestive) heart failure: Secondary | ICD-10-CM | POA: Diagnosis not present

## 2019-02-20 DIAGNOSIS — J9611 Chronic respiratory failure with hypoxia: Secondary | ICD-10-CM | POA: Diagnosis not present

## 2019-02-20 DIAGNOSIS — I313 Pericardial effusion (noninflammatory): Secondary | ICD-10-CM | POA: Diagnosis not present

## 2019-02-20 DIAGNOSIS — J918 Pleural effusion in other conditions classified elsewhere: Secondary | ICD-10-CM | POA: Diagnosis not present

## 2019-02-23 DIAGNOSIS — Z23 Encounter for immunization: Secondary | ICD-10-CM | POA: Diagnosis not present

## 2019-02-28 DIAGNOSIS — J918 Pleural effusion in other conditions classified elsewhere: Secondary | ICD-10-CM | POA: Diagnosis not present

## 2019-02-28 DIAGNOSIS — I313 Pericardial effusion (noninflammatory): Secondary | ICD-10-CM | POA: Diagnosis not present

## 2019-02-28 DIAGNOSIS — I110 Hypertensive heart disease with heart failure: Secondary | ICD-10-CM | POA: Diagnosis not present

## 2019-02-28 DIAGNOSIS — I5030 Unspecified diastolic (congestive) heart failure: Secondary | ICD-10-CM | POA: Diagnosis not present

## 2019-02-28 DIAGNOSIS — J449 Chronic obstructive pulmonary disease, unspecified: Secondary | ICD-10-CM | POA: Diagnosis not present

## 2019-02-28 DIAGNOSIS — J9611 Chronic respiratory failure with hypoxia: Secondary | ICD-10-CM | POA: Diagnosis not present

## 2019-03-02 DIAGNOSIS — J449 Chronic obstructive pulmonary disease, unspecified: Secondary | ICD-10-CM | POA: Diagnosis not present

## 2019-03-02 DIAGNOSIS — E039 Hypothyroidism, unspecified: Secondary | ICD-10-CM | POA: Diagnosis not present

## 2019-03-02 DIAGNOSIS — Z86006 Personal history of melanoma in-situ: Secondary | ICD-10-CM | POA: Diagnosis not present

## 2019-03-02 DIAGNOSIS — F0390 Unspecified dementia without behavioral disturbance: Secondary | ICD-10-CM | POA: Diagnosis not present

## 2019-03-02 DIAGNOSIS — E785 Hyperlipidemia, unspecified: Secondary | ICD-10-CM | POA: Diagnosis not present

## 2019-03-02 DIAGNOSIS — Z6825 Body mass index (BMI) 25.0-25.9, adult: Secondary | ICD-10-CM | POA: Diagnosis not present

## 2019-03-02 DIAGNOSIS — Z9981 Dependence on supplemental oxygen: Secondary | ICD-10-CM | POA: Diagnosis not present

## 2019-03-02 DIAGNOSIS — J9611 Chronic respiratory failure with hypoxia: Secondary | ICD-10-CM | POA: Diagnosis not present

## 2019-03-02 DIAGNOSIS — J918 Pleural effusion in other conditions classified elsewhere: Secondary | ICD-10-CM | POA: Diagnosis not present

## 2019-03-02 DIAGNOSIS — R32 Unspecified urinary incontinence: Secondary | ICD-10-CM | POA: Diagnosis not present

## 2019-03-02 DIAGNOSIS — I25119 Atherosclerotic heart disease of native coronary artery with unspecified angina pectoris: Secondary | ICD-10-CM | POA: Diagnosis not present

## 2019-03-02 DIAGNOSIS — R296 Repeated falls: Secondary | ICD-10-CM | POA: Diagnosis not present

## 2019-03-02 DIAGNOSIS — Z8701 Personal history of pneumonia (recurrent): Secondary | ICD-10-CM | POA: Diagnosis not present

## 2019-03-02 DIAGNOSIS — R159 Full incontinence of feces: Secondary | ICD-10-CM | POA: Diagnosis not present

## 2019-03-02 DIAGNOSIS — I5030 Unspecified diastolic (congestive) heart failure: Secondary | ICD-10-CM | POA: Diagnosis not present

## 2019-03-02 DIAGNOSIS — Z741 Need for assistance with personal care: Secondary | ICD-10-CM | POA: Diagnosis not present

## 2019-03-02 DIAGNOSIS — I313 Pericardial effusion (noninflammatory): Secondary | ICD-10-CM | POA: Diagnosis not present

## 2019-03-02 DIAGNOSIS — I110 Hypertensive heart disease with heart failure: Secondary | ICD-10-CM | POA: Diagnosis not present

## 2019-03-02 DIAGNOSIS — Z8781 Personal history of (healed) traumatic fracture: Secondary | ICD-10-CM | POA: Diagnosis not present

## 2019-03-08 DIAGNOSIS — I5030 Unspecified diastolic (congestive) heart failure: Secondary | ICD-10-CM | POA: Diagnosis not present

## 2019-03-08 DIAGNOSIS — I739 Peripheral vascular disease, unspecified: Secondary | ICD-10-CM | POA: Diagnosis not present

## 2019-03-08 DIAGNOSIS — B351 Tinea unguium: Secondary | ICD-10-CM | POA: Diagnosis not present

## 2019-03-08 DIAGNOSIS — J449 Chronic obstructive pulmonary disease, unspecified: Secondary | ICD-10-CM | POA: Diagnosis not present

## 2019-03-08 DIAGNOSIS — J918 Pleural effusion in other conditions classified elsewhere: Secondary | ICD-10-CM | POA: Diagnosis not present

## 2019-03-08 DIAGNOSIS — I110 Hypertensive heart disease with heart failure: Secondary | ICD-10-CM | POA: Diagnosis not present

## 2019-03-08 DIAGNOSIS — I313 Pericardial effusion (noninflammatory): Secondary | ICD-10-CM | POA: Diagnosis not present

## 2019-03-08 DIAGNOSIS — J9611 Chronic respiratory failure with hypoxia: Secondary | ICD-10-CM | POA: Diagnosis not present

## 2019-03-13 DIAGNOSIS — Z20828 Contact with and (suspected) exposure to other viral communicable diseases: Secondary | ICD-10-CM | POA: Diagnosis not present

## 2019-03-13 DIAGNOSIS — I10 Essential (primary) hypertension: Secondary | ICD-10-CM | POA: Diagnosis not present

## 2019-03-13 DIAGNOSIS — M8949 Other hypertrophic osteoarthropathy, multiple sites: Secondary | ICD-10-CM | POA: Diagnosis not present

## 2019-03-13 DIAGNOSIS — I313 Pericardial effusion (noninflammatory): Secondary | ICD-10-CM | POA: Diagnosis not present

## 2019-03-13 DIAGNOSIS — Z79899 Other long term (current) drug therapy: Secondary | ICD-10-CM | POA: Diagnosis not present

## 2019-03-14 DIAGNOSIS — J918 Pleural effusion in other conditions classified elsewhere: Secondary | ICD-10-CM | POA: Diagnosis not present

## 2019-03-14 DIAGNOSIS — J9611 Chronic respiratory failure with hypoxia: Secondary | ICD-10-CM | POA: Diagnosis not present

## 2019-03-14 DIAGNOSIS — I110 Hypertensive heart disease with heart failure: Secondary | ICD-10-CM | POA: Diagnosis not present

## 2019-03-14 DIAGNOSIS — I5030 Unspecified diastolic (congestive) heart failure: Secondary | ICD-10-CM | POA: Diagnosis not present

## 2019-03-14 DIAGNOSIS — J449 Chronic obstructive pulmonary disease, unspecified: Secondary | ICD-10-CM | POA: Diagnosis not present

## 2019-03-14 DIAGNOSIS — I313 Pericardial effusion (noninflammatory): Secondary | ICD-10-CM | POA: Diagnosis not present

## 2019-03-17 DIAGNOSIS — J449 Chronic obstructive pulmonary disease, unspecified: Secondary | ICD-10-CM | POA: Diagnosis not present

## 2019-03-17 DIAGNOSIS — I5030 Unspecified diastolic (congestive) heart failure: Secondary | ICD-10-CM | POA: Diagnosis not present

## 2019-03-17 DIAGNOSIS — J918 Pleural effusion in other conditions classified elsewhere: Secondary | ICD-10-CM | POA: Diagnosis not present

## 2019-03-17 DIAGNOSIS — J9611 Chronic respiratory failure with hypoxia: Secondary | ICD-10-CM | POA: Diagnosis not present

## 2019-03-17 DIAGNOSIS — I110 Hypertensive heart disease with heart failure: Secondary | ICD-10-CM | POA: Diagnosis not present

## 2019-03-17 DIAGNOSIS — I313 Pericardial effusion (noninflammatory): Secondary | ICD-10-CM | POA: Diagnosis not present

## 2019-03-22 DIAGNOSIS — J449 Chronic obstructive pulmonary disease, unspecified: Secondary | ICD-10-CM | POA: Diagnosis not present

## 2019-03-22 DIAGNOSIS — J9611 Chronic respiratory failure with hypoxia: Secondary | ICD-10-CM | POA: Diagnosis not present

## 2019-03-22 DIAGNOSIS — I313 Pericardial effusion (noninflammatory): Secondary | ICD-10-CM | POA: Diagnosis not present

## 2019-03-22 DIAGNOSIS — J918 Pleural effusion in other conditions classified elsewhere: Secondary | ICD-10-CM | POA: Diagnosis not present

## 2019-03-22 DIAGNOSIS — I110 Hypertensive heart disease with heart failure: Secondary | ICD-10-CM | POA: Diagnosis not present

## 2019-03-22 DIAGNOSIS — I5030 Unspecified diastolic (congestive) heart failure: Secondary | ICD-10-CM | POA: Diagnosis not present

## 2019-03-27 DIAGNOSIS — Z20828 Contact with and (suspected) exposure to other viral communicable diseases: Secondary | ICD-10-CM | POA: Diagnosis not present

## 2019-03-29 ENCOUNTER — Other Ambulatory Visit: Payer: Self-pay

## 2019-03-29 DIAGNOSIS — J9611 Chronic respiratory failure with hypoxia: Secondary | ICD-10-CM | POA: Diagnosis not present

## 2019-03-29 DIAGNOSIS — I313 Pericardial effusion (noninflammatory): Secondary | ICD-10-CM | POA: Diagnosis not present

## 2019-03-29 DIAGNOSIS — I110 Hypertensive heart disease with heart failure: Secondary | ICD-10-CM | POA: Diagnosis not present

## 2019-03-29 DIAGNOSIS — I5030 Unspecified diastolic (congestive) heart failure: Secondary | ICD-10-CM | POA: Diagnosis not present

## 2019-03-29 DIAGNOSIS — J449 Chronic obstructive pulmonary disease, unspecified: Secondary | ICD-10-CM | POA: Diagnosis not present

## 2019-03-29 DIAGNOSIS — J918 Pleural effusion in other conditions classified elsewhere: Secondary | ICD-10-CM | POA: Diagnosis not present

## 2019-03-30 DIAGNOSIS — J9611 Chronic respiratory failure with hypoxia: Secondary | ICD-10-CM | POA: Diagnosis not present

## 2019-03-30 DIAGNOSIS — J449 Chronic obstructive pulmonary disease, unspecified: Secondary | ICD-10-CM | POA: Diagnosis not present

## 2019-03-30 DIAGNOSIS — I313 Pericardial effusion (noninflammatory): Secondary | ICD-10-CM | POA: Diagnosis not present

## 2019-03-30 DIAGNOSIS — J918 Pleural effusion in other conditions classified elsewhere: Secondary | ICD-10-CM | POA: Diagnosis not present

## 2019-03-30 DIAGNOSIS — I5030 Unspecified diastolic (congestive) heart failure: Secondary | ICD-10-CM | POA: Diagnosis not present

## 2019-03-30 DIAGNOSIS — I110 Hypertensive heart disease with heart failure: Secondary | ICD-10-CM | POA: Diagnosis not present

## 2019-03-31 DIAGNOSIS — Z20828 Contact with and (suspected) exposure to other viral communicable diseases: Secondary | ICD-10-CM | POA: Diagnosis not present

## 2019-04-02 DIAGNOSIS — Z741 Need for assistance with personal care: Secondary | ICD-10-CM | POA: Diagnosis not present

## 2019-04-02 DIAGNOSIS — I110 Hypertensive heart disease with heart failure: Secondary | ICD-10-CM | POA: Diagnosis not present

## 2019-04-02 DIAGNOSIS — E785 Hyperlipidemia, unspecified: Secondary | ICD-10-CM | POA: Diagnosis not present

## 2019-04-02 DIAGNOSIS — Z8781 Personal history of (healed) traumatic fracture: Secondary | ICD-10-CM | POA: Diagnosis not present

## 2019-04-02 DIAGNOSIS — I25119 Atherosclerotic heart disease of native coronary artery with unspecified angina pectoris: Secondary | ICD-10-CM | POA: Diagnosis not present

## 2019-04-02 DIAGNOSIS — I313 Pericardial effusion (noninflammatory): Secondary | ICD-10-CM | POA: Diagnosis not present

## 2019-04-02 DIAGNOSIS — E039 Hypothyroidism, unspecified: Secondary | ICD-10-CM | POA: Diagnosis not present

## 2019-04-02 DIAGNOSIS — J9611 Chronic respiratory failure with hypoxia: Secondary | ICD-10-CM | POA: Diagnosis not present

## 2019-04-02 DIAGNOSIS — R296 Repeated falls: Secondary | ICD-10-CM | POA: Diagnosis not present

## 2019-04-02 DIAGNOSIS — Z8701 Personal history of pneumonia (recurrent): Secondary | ICD-10-CM | POA: Diagnosis not present

## 2019-04-02 DIAGNOSIS — Z6825 Body mass index (BMI) 25.0-25.9, adult: Secondary | ICD-10-CM | POA: Diagnosis not present

## 2019-04-02 DIAGNOSIS — R159 Full incontinence of feces: Secondary | ICD-10-CM | POA: Diagnosis not present

## 2019-04-02 DIAGNOSIS — F0390 Unspecified dementia without behavioral disturbance: Secondary | ICD-10-CM | POA: Diagnosis not present

## 2019-04-02 DIAGNOSIS — R32 Unspecified urinary incontinence: Secondary | ICD-10-CM | POA: Diagnosis not present

## 2019-04-02 DIAGNOSIS — J449 Chronic obstructive pulmonary disease, unspecified: Secondary | ICD-10-CM | POA: Diagnosis not present

## 2019-04-02 DIAGNOSIS — Z9981 Dependence on supplemental oxygen: Secondary | ICD-10-CM | POA: Diagnosis not present

## 2019-04-02 DIAGNOSIS — I5030 Unspecified diastolic (congestive) heart failure: Secondary | ICD-10-CM | POA: Diagnosis not present

## 2019-04-02 DIAGNOSIS — J918 Pleural effusion in other conditions classified elsewhere: Secondary | ICD-10-CM | POA: Diagnosis not present

## 2019-04-02 DIAGNOSIS — Z86006 Personal history of melanoma in-situ: Secondary | ICD-10-CM | POA: Diagnosis not present

## 2019-04-03 DIAGNOSIS — Z20828 Contact with and (suspected) exposure to other viral communicable diseases: Secondary | ICD-10-CM | POA: Diagnosis not present

## 2019-04-04 DIAGNOSIS — J449 Chronic obstructive pulmonary disease, unspecified: Secondary | ICD-10-CM | POA: Diagnosis not present

## 2019-04-04 DIAGNOSIS — I313 Pericardial effusion (noninflammatory): Secondary | ICD-10-CM | POA: Diagnosis not present

## 2019-04-04 DIAGNOSIS — J918 Pleural effusion in other conditions classified elsewhere: Secondary | ICD-10-CM | POA: Diagnosis not present

## 2019-04-04 DIAGNOSIS — I5030 Unspecified diastolic (congestive) heart failure: Secondary | ICD-10-CM | POA: Diagnosis not present

## 2019-04-04 DIAGNOSIS — I110 Hypertensive heart disease with heart failure: Secondary | ICD-10-CM | POA: Diagnosis not present

## 2019-04-04 DIAGNOSIS — J9611 Chronic respiratory failure with hypoxia: Secondary | ICD-10-CM | POA: Diagnosis not present

## 2019-04-06 DIAGNOSIS — Z20828 Contact with and (suspected) exposure to other viral communicable diseases: Secondary | ICD-10-CM | POA: Diagnosis not present

## 2019-04-10 DIAGNOSIS — Z20828 Contact with and (suspected) exposure to other viral communicable diseases: Secondary | ICD-10-CM | POA: Diagnosis not present

## 2019-04-11 DIAGNOSIS — J9611 Chronic respiratory failure with hypoxia: Secondary | ICD-10-CM | POA: Diagnosis not present

## 2019-04-11 DIAGNOSIS — J918 Pleural effusion in other conditions classified elsewhere: Secondary | ICD-10-CM | POA: Diagnosis not present

## 2019-04-11 DIAGNOSIS — I5030 Unspecified diastolic (congestive) heart failure: Secondary | ICD-10-CM | POA: Diagnosis not present

## 2019-04-11 DIAGNOSIS — J449 Chronic obstructive pulmonary disease, unspecified: Secondary | ICD-10-CM | POA: Diagnosis not present

## 2019-04-11 DIAGNOSIS — I313 Pericardial effusion (noninflammatory): Secondary | ICD-10-CM | POA: Diagnosis not present

## 2019-04-11 DIAGNOSIS — I110 Hypertensive heart disease with heart failure: Secondary | ICD-10-CM | POA: Diagnosis not present

## 2019-04-13 DIAGNOSIS — Z20828 Contact with and (suspected) exposure to other viral communicable diseases: Secondary | ICD-10-CM | POA: Diagnosis not present

## 2019-04-14 DIAGNOSIS — I5030 Unspecified diastolic (congestive) heart failure: Secondary | ICD-10-CM | POA: Diagnosis not present

## 2019-04-14 DIAGNOSIS — I313 Pericardial effusion (noninflammatory): Secondary | ICD-10-CM | POA: Diagnosis not present

## 2019-04-14 DIAGNOSIS — J9611 Chronic respiratory failure with hypoxia: Secondary | ICD-10-CM | POA: Diagnosis not present

## 2019-04-14 DIAGNOSIS — J918 Pleural effusion in other conditions classified elsewhere: Secondary | ICD-10-CM | POA: Diagnosis not present

## 2019-04-14 DIAGNOSIS — J449 Chronic obstructive pulmonary disease, unspecified: Secondary | ICD-10-CM | POA: Diagnosis not present

## 2019-04-14 DIAGNOSIS — I110 Hypertensive heart disease with heart failure: Secondary | ICD-10-CM | POA: Diagnosis not present

## 2019-04-17 DIAGNOSIS — G301 Alzheimer's disease with late onset: Secondary | ICD-10-CM | POA: Diagnosis not present

## 2019-04-17 DIAGNOSIS — Z79899 Other long term (current) drug therapy: Secondary | ICD-10-CM | POA: Diagnosis not present

## 2019-04-17 DIAGNOSIS — I10 Essential (primary) hypertension: Secondary | ICD-10-CM | POA: Diagnosis not present

## 2019-04-17 DIAGNOSIS — S81811A Laceration without foreign body, right lower leg, initial encounter: Secondary | ICD-10-CM | POA: Diagnosis not present

## 2019-04-17 DIAGNOSIS — W19XXXD Unspecified fall, subsequent encounter: Secondary | ICD-10-CM | POA: Diagnosis not present

## 2019-04-18 DIAGNOSIS — J918 Pleural effusion in other conditions classified elsewhere: Secondary | ICD-10-CM | POA: Diagnosis not present

## 2019-04-18 DIAGNOSIS — J449 Chronic obstructive pulmonary disease, unspecified: Secondary | ICD-10-CM | POA: Diagnosis not present

## 2019-04-18 DIAGNOSIS — I313 Pericardial effusion (noninflammatory): Secondary | ICD-10-CM | POA: Diagnosis not present

## 2019-04-18 DIAGNOSIS — Z20828 Contact with and (suspected) exposure to other viral communicable diseases: Secondary | ICD-10-CM | POA: Diagnosis not present

## 2019-04-18 DIAGNOSIS — I5030 Unspecified diastolic (congestive) heart failure: Secondary | ICD-10-CM | POA: Diagnosis not present

## 2019-04-18 DIAGNOSIS — J9611 Chronic respiratory failure with hypoxia: Secondary | ICD-10-CM | POA: Diagnosis not present

## 2019-04-18 DIAGNOSIS — I110 Hypertensive heart disease with heart failure: Secondary | ICD-10-CM | POA: Diagnosis not present

## 2019-04-21 DIAGNOSIS — I110 Hypertensive heart disease with heart failure: Secondary | ICD-10-CM | POA: Diagnosis not present

## 2019-04-21 DIAGNOSIS — I313 Pericardial effusion (noninflammatory): Secondary | ICD-10-CM | POA: Diagnosis not present

## 2019-04-21 DIAGNOSIS — J9611 Chronic respiratory failure with hypoxia: Secondary | ICD-10-CM | POA: Diagnosis not present

## 2019-04-21 DIAGNOSIS — J449 Chronic obstructive pulmonary disease, unspecified: Secondary | ICD-10-CM | POA: Diagnosis not present

## 2019-04-21 DIAGNOSIS — J918 Pleural effusion in other conditions classified elsewhere: Secondary | ICD-10-CM | POA: Diagnosis not present

## 2019-04-21 DIAGNOSIS — I5030 Unspecified diastolic (congestive) heart failure: Secondary | ICD-10-CM | POA: Diagnosis not present

## 2019-04-24 DIAGNOSIS — G301 Alzheimer's disease with late onset: Secondary | ICD-10-CM | POA: Diagnosis not present

## 2019-04-24 DIAGNOSIS — J449 Chronic obstructive pulmonary disease, unspecified: Secondary | ICD-10-CM | POA: Diagnosis not present

## 2019-04-24 DIAGNOSIS — I110 Hypertensive heart disease with heart failure: Secondary | ICD-10-CM | POA: Diagnosis not present

## 2019-04-24 DIAGNOSIS — J918 Pleural effusion in other conditions classified elsewhere: Secondary | ICD-10-CM | POA: Diagnosis not present

## 2019-04-24 DIAGNOSIS — Z79899 Other long term (current) drug therapy: Secondary | ICD-10-CM | POA: Diagnosis not present

## 2019-04-24 DIAGNOSIS — I5030 Unspecified diastolic (congestive) heart failure: Secondary | ICD-10-CM | POA: Diagnosis not present

## 2019-04-24 DIAGNOSIS — J9611 Chronic respiratory failure with hypoxia: Secondary | ICD-10-CM | POA: Diagnosis not present

## 2019-04-24 DIAGNOSIS — K644 Residual hemorrhoidal skin tags: Secondary | ICD-10-CM | POA: Diagnosis not present

## 2019-04-24 DIAGNOSIS — F331 Major depressive disorder, recurrent, moderate: Secondary | ICD-10-CM | POA: Diagnosis not present

## 2019-04-24 DIAGNOSIS — E039 Hypothyroidism, unspecified: Secondary | ICD-10-CM | POA: Diagnosis not present

## 2019-04-24 DIAGNOSIS — I313 Pericardial effusion (noninflammatory): Secondary | ICD-10-CM | POA: Diagnosis not present

## 2019-04-25 DIAGNOSIS — Z20828 Contact with and (suspected) exposure to other viral communicable diseases: Secondary | ICD-10-CM | POA: Diagnosis not present

## 2019-04-25 DIAGNOSIS — I5030 Unspecified diastolic (congestive) heart failure: Secondary | ICD-10-CM | POA: Diagnosis not present

## 2019-04-25 DIAGNOSIS — I313 Pericardial effusion (noninflammatory): Secondary | ICD-10-CM | POA: Diagnosis not present

## 2019-04-25 DIAGNOSIS — I110 Hypertensive heart disease with heart failure: Secondary | ICD-10-CM | POA: Diagnosis not present

## 2019-04-25 DIAGNOSIS — J449 Chronic obstructive pulmonary disease, unspecified: Secondary | ICD-10-CM | POA: Diagnosis not present

## 2019-04-25 DIAGNOSIS — J9611 Chronic respiratory failure with hypoxia: Secondary | ICD-10-CM | POA: Diagnosis not present

## 2019-04-25 DIAGNOSIS — J918 Pleural effusion in other conditions classified elsewhere: Secondary | ICD-10-CM | POA: Diagnosis not present

## 2019-05-04 DIAGNOSIS — Z20828 Contact with and (suspected) exposure to other viral communicable diseases: Secondary | ICD-10-CM | POA: Diagnosis not present

## 2019-05-08 DIAGNOSIS — L539 Erythematous condition, unspecified: Secondary | ICD-10-CM | POA: Diagnosis not present

## 2019-05-08 DIAGNOSIS — W19XXXD Unspecified fall, subsequent encounter: Secondary | ICD-10-CM | POA: Diagnosis not present

## 2019-05-08 DIAGNOSIS — Z79899 Other long term (current) drug therapy: Secondary | ICD-10-CM | POA: Diagnosis not present

## 2019-05-08 DIAGNOSIS — Z20828 Contact with and (suspected) exposure to other viral communicable diseases: Secondary | ICD-10-CM | POA: Diagnosis not present

## 2019-05-08 DIAGNOSIS — I10 Essential (primary) hypertension: Secondary | ICD-10-CM | POA: Diagnosis not present

## 2019-05-08 DIAGNOSIS — G301 Alzheimer's disease with late onset: Secondary | ICD-10-CM | POA: Diagnosis not present

## 2019-05-11 DIAGNOSIS — Z20828 Contact with and (suspected) exposure to other viral communicable diseases: Secondary | ICD-10-CM | POA: Diagnosis not present

## 2019-05-13 DIAGNOSIS — L602 Onychogryphosis: Secondary | ICD-10-CM | POA: Diagnosis not present

## 2019-05-13 DIAGNOSIS — I739 Peripheral vascular disease, unspecified: Secondary | ICD-10-CM | POA: Diagnosis not present

## 2019-05-15 DIAGNOSIS — Z20828 Contact with and (suspected) exposure to other viral communicable diseases: Secondary | ICD-10-CM | POA: Diagnosis not present

## 2019-10-31 DEATH — deceased
